# Patient Record
Sex: Female | Born: 1935 | Race: White | Hispanic: No | Marital: Married | Smoking: Former smoker
Health system: Southern US, Community
[De-identification: ages and names within clinical notes are randomized; demographics above are authoritative.]

## PROBLEM LIST (undated history)

## (undated) DIAGNOSIS — M1990 Unspecified osteoarthritis, unspecified site: Secondary | ICD-10-CM

## (undated) DIAGNOSIS — R350 Frequency of micturition: Secondary | ICD-10-CM

## (undated) DIAGNOSIS — I639 Cerebral infarction, unspecified: Secondary | ICD-10-CM

## (undated) DIAGNOSIS — F32A Depression, unspecified: Secondary | ICD-10-CM

## (undated) DIAGNOSIS — E785 Hyperlipidemia, unspecified: Secondary | ICD-10-CM

## (undated) DIAGNOSIS — E119 Type 2 diabetes mellitus without complications: Secondary | ICD-10-CM

## (undated) DIAGNOSIS — I219 Acute myocardial infarction, unspecified: Secondary | ICD-10-CM

## (undated) DIAGNOSIS — I255 Ischemic cardiomyopathy: Secondary | ICD-10-CM

## (undated) DIAGNOSIS — F329 Major depressive disorder, single episode, unspecified: Secondary | ICD-10-CM

## (undated) DIAGNOSIS — D649 Anemia, unspecified: Secondary | ICD-10-CM

## (undated) DIAGNOSIS — I2510 Atherosclerotic heart disease of native coronary artery without angina pectoris: Secondary | ICD-10-CM

## (undated) DIAGNOSIS — I10 Essential (primary) hypertension: Secondary | ICD-10-CM

## (undated) DIAGNOSIS — K219 Gastro-esophageal reflux disease without esophagitis: Secondary | ICD-10-CM

## (undated) HISTORY — DX: Hyperlipidemia, unspecified: E78.5

## (undated) HISTORY — DX: Ischemic cardiomyopathy: I25.5

## (undated) HISTORY — DX: Essential (primary) hypertension: I10

## (undated) HISTORY — PX: CORONARY ARTERY BYPASS GRAFT: SHX141

## (undated) HISTORY — DX: Atherosclerotic heart disease of native coronary artery without angina pectoris: I25.10

## (undated) HISTORY — PX: TONSILLECTOMY: SUR1361

## (undated) HISTORY — PX: SHOULDER ARTHROSCOPY W/ ROTATOR CUFF REPAIR: SHX2400

## (undated) HISTORY — DX: Anemia, unspecified: D64.9

## (undated) HISTORY — PX: APPENDECTOMY: SHX54

---

## 2001-05-08 ENCOUNTER — Encounter: Payer: Self-pay | Admitting: Orthopedic Surgery

## 2001-05-08 ENCOUNTER — Encounter: Admission: RE | Admit: 2001-05-08 | Discharge: 2001-05-08 | Payer: Self-pay | Admitting: Orthopedic Surgery

## 2001-05-28 ENCOUNTER — Encounter: Payer: Self-pay | Admitting: Orthopedic Surgery

## 2001-05-28 ENCOUNTER — Encounter: Admission: RE | Admit: 2001-05-28 | Discharge: 2001-05-28 | Payer: Self-pay | Admitting: Orthopedic Surgery

## 2001-05-30 ENCOUNTER — Ambulatory Visit (HOSPITAL_BASED_OUTPATIENT_CLINIC_OR_DEPARTMENT_OTHER): Admission: RE | Admit: 2001-05-30 | Discharge: 2001-05-31 | Payer: Self-pay | Admitting: Orthopedic Surgery

## 2007-04-24 ENCOUNTER — Ambulatory Visit: Payer: Self-pay | Admitting: Cardiology

## 2007-04-24 ENCOUNTER — Ambulatory Visit: Payer: Self-pay | Admitting: Cardiovascular Disease

## 2007-04-24 ENCOUNTER — Inpatient Hospital Stay (HOSPITAL_COMMUNITY): Admission: EM | Admit: 2007-04-24 | Discharge: 2007-05-08 | Payer: Self-pay | Admitting: Emergency Medicine

## 2007-04-25 ENCOUNTER — Encounter: Payer: Self-pay | Admitting: Internal Medicine

## 2007-04-26 ENCOUNTER — Ambulatory Visit: Payer: Self-pay | Admitting: Surgery

## 2007-05-22 ENCOUNTER — Ambulatory Visit: Payer: Self-pay | Admitting: Cardiovascular Disease

## 2007-05-27 ENCOUNTER — Ambulatory Visit: Payer: Self-pay | Admitting: Surgery

## 2007-05-27 ENCOUNTER — Encounter: Admission: RE | Admit: 2007-05-27 | Discharge: 2007-05-27 | Payer: Self-pay | Admitting: Surgery

## 2007-05-27 ENCOUNTER — Encounter: Payer: Self-pay | Admitting: Cardiology

## 2007-05-27 LAB — CONVERTED CEMR LAB
Amylase: 56 units/L (ref 0–105)
BUN: 17 mg/dL (ref 6–23)
CO2: 20 meq/L (ref 19–32)
Calcium: 9.1 mg/dL (ref 8.4–10.5)
Chloride: 103 meq/L (ref 96–112)
Creatinine, Ser: 0.97 mg/dL (ref 0.40–1.20)
Free T4: 1.23 ng/dL (ref 0.89–1.80)
Glucose, Bld: 122 mg/dL — ABNORMAL HIGH (ref 70–99)
HCT: 41.8 % (ref 36.0–46.0)
Hemoglobin: 13.4 g/dL (ref 12.0–15.0)
Lipase: 79 units/L — ABNORMAL HIGH (ref 0–75)
MCHC: 32.1 g/dL (ref 30.0–36.0)
MCV: 89.9 fL (ref 78.0–100.0)
Platelets: 398 10*3/uL (ref 150–400)
Potassium: 4.3 meq/L (ref 3.5–5.3)
RBC: 4.65 M/uL (ref 3.87–5.11)
RDW: 14.6 % — ABNORMAL HIGH (ref 11.5–14.0)
Sodium: 138 meq/L (ref 135–145)
TSH: 4.045 microintl units/mL (ref 0.350–5.50)
WBC: 7.9 10*3/uL (ref 4.0–10.5)

## 2007-08-05 ENCOUNTER — Ambulatory Visit: Payer: Self-pay | Admitting: Cardiology

## 2007-11-06 ENCOUNTER — Ambulatory Visit: Payer: Self-pay | Admitting: Cardiology

## 2007-11-06 LAB — CONVERTED CEMR LAB
ALT: 24 units/L (ref 0–35)
AST: 29 units/L (ref 0–37)
Albumin: 3.7 g/dL (ref 3.5–5.2)
Alkaline Phosphatase: 66 units/L (ref 39–117)
Bilirubin, Direct: 0.1 mg/dL (ref 0.0–0.3)
Cholesterol: 179 mg/dL (ref 0–200)
HDL: 49.9 mg/dL (ref >39.0)
LDL Cholesterol: 104 mg/dL — ABNORMAL HIGH (ref 0–99)
Total Bilirubin: 0.7 mg/dL (ref 0.3–1.2)
Total CHOL/HDL Ratio: 3.6
Total Protein: 6.9 g/dL (ref 6.0–8.3)
Triglycerides: 128 mg/dL (ref 0–149)
VLDL: 26 mg/dL (ref 0–40)

## 2008-01-26 ENCOUNTER — Ambulatory Visit: Payer: Self-pay | Admitting: Cardiology

## 2008-11-25 ENCOUNTER — Ambulatory Visit: Payer: Self-pay | Admitting: Cardiology

## 2008-11-25 ENCOUNTER — Encounter: Payer: Self-pay | Admitting: Cardiology

## 2008-11-25 DIAGNOSIS — I10 Essential (primary) hypertension: Secondary | ICD-10-CM | POA: Insufficient documentation

## 2008-11-25 DIAGNOSIS — R0602 Shortness of breath: Secondary | ICD-10-CM | POA: Insufficient documentation

## 2008-11-25 DIAGNOSIS — I2510 Atherosclerotic heart disease of native coronary artery without angina pectoris: Secondary | ICD-10-CM | POA: Insufficient documentation

## 2008-11-25 DIAGNOSIS — E785 Hyperlipidemia, unspecified: Secondary | ICD-10-CM | POA: Insufficient documentation

## 2008-11-25 LAB — CONVERTED CEMR LAB
BUN: 25 mg/dL — ABNORMAL HIGH (ref 6–23)
CO2: 29 meq/L (ref 19–32)
Calcium: 8.9 mg/dL (ref 8.4–10.5)
Chloride: 103 meq/L (ref 96–112)
Creatinine, Ser: 1.2 mg/dL (ref 0.4–1.2)
GFR calc non Af Amer: 46.02 mL/min (ref >60)
Glucose, Bld: 95 mg/dL (ref 70–99)
Potassium: 3.8 meq/L (ref 3.5–5.1)
Pro B Natriuretic peptide (BNP): 212 pg/mL — ABNORMAL HIGH (ref 0.0–100.0)
Sodium: 139 meq/L (ref 135–145)

## 2008-12-29 ENCOUNTER — Encounter: Payer: Self-pay | Admitting: Cardiology

## 2009-01-14 ENCOUNTER — Encounter: Payer: Self-pay | Admitting: Cardiology

## 2009-02-17 ENCOUNTER — Ambulatory Visit: Payer: Self-pay | Admitting: Cardiology

## 2009-05-13 ENCOUNTER — Encounter (INDEPENDENT_AMBULATORY_CARE_PROVIDER_SITE_OTHER): Payer: Self-pay | Admitting: *Deleted

## 2009-08-02 ENCOUNTER — Ambulatory Visit: Payer: Self-pay | Admitting: Cardiology

## 2010-08-04 ENCOUNTER — Encounter: Payer: Self-pay | Admitting: Cardiology

## 2010-08-04 ENCOUNTER — Ambulatory Visit: Payer: Self-pay | Admitting: Cardiology

## 2010-09-28 NOTE — Assessment & Plan Note (Signed)
Summary: 414.01 401.1  pfh,rn  Medications Added CRESTOR 20 MG TABS (ROSUVASTATIN CALCIUM) 1 by mouth daily FISH OIL   OIL (FISH OIL) 2 by mouth daily ATIVAN 0.5 MG TABS (LORAZEPAM) as needed METOPROLOL TARTRATE 25 MG TABS (METOPROLOL TARTRATE) one tablet twice a day      Allergies Added:   Visit Type:  Follow-up Primary Provider:  Blane Ohara, MD  CC:  CAD.  History of Present Illness: The patient presents for followup of her known coronary disease. Since I last saw her she continues to do exercising though not as frequently as I would like. She denies any chest discomfort, neck or arm discomfort. She has had palpitations, presyncope or syncope. She has had no PND or orthopnea. She has been unable to lose weight and says she just eats too much. She also reports that her LDL is not at target.  Current Medications (verified): 1)  Sertraline Hcl 50 Mg Tabs (Sertraline Hcl) .... One By Mouth Daily 2)  Metoprolol Succinate 50 Mg Xr24h-Tab (Metoprolol Succinate) .... One By Mouth Daily 3)  Crestor 20 Mg Tabs (Rosuvastatin Calcium) .Marland Kitchen.. 1 By Mouth Daily 4)  Aspirin 81 Mg Tbec (Aspirin) .... Take One Tablet By Mouth Daily 5)  Furosemide 20 Mg Tabs (Furosemide) .... Take One Tablet By Mouth Daily. 6)  Nitroglycerin 0.4 Mg Subl (Nitroglycerin) .... One Tablet Under Tongue Every 5 Minutes As Needed For Chest Pain---May Repeat Times Three 7)  Omeprazole 20 Mg Cpdr (Omeprazole) .... Take One Tablet By Mouth Once Daily. 8)  Lisinopril-Hydrochlorothiazide 10-12.5 Mg Tabs (Lisinopril-Hydrochlorothiazide) .Marland Kitchen.. 1 By Mouth Daily 9)  Fish Oil   Oil (Fish Oil) .... 2 By Mouth Daily 10)  Ativan 0.5 Mg Tabs (Lorazepam) .... As Needed  Allergies (verified): 1)  ! Penicillin 2)  ! Lipitor (Atorvastatin)  Past History:  Past Medical History:  1. Coronary artery disease (LAD, totally occluded at the origin, 80%       narrowing in the midsegment after diagonal, 50% narrowing in the       midvessel, 70%  narrowing in the proximal mid to mid circumflex,       right coronary artery had 30% mid stenosis followed by 80% stenosis       in the PDA.  EF 40%.  She underwent PTCA of a totally occluded       diagonal branch to the LAD.  She subsequently had CABG to the LIMA       to the LAD, SVG to diagonal, SVG to obtuse marginal, SVG to PDA).      (EF 60% with followup echocardiography 2010)  2. Hypertension.   3. Dyslipidemia.   4. Fe Defficiency anemia  Past Surgical History: Reviewed history from 08/02/2009 and no changes required. CABG Cataract Surgery  Review of Systems       As stated in the HPI and negative for all other systems.   Vital Signs:  Patient profile:   77 year old female Height:      64 inches Weight:      157 pounds BMI:     27.05 Pulse rate:   56 / minute Resp:     16 per minute BP sitting:   122 / 64  (right arm)  Vitals Entered By: Marrion Coy, CNA (August 04, 2010 9:22 AM)  Physical Exam  General:  Well developed, well nourished, in no acute distress. Head:  normocephalic and atraumatic Eyes:  PERRLA/EOM intact; conjunctiva and lids normal. Neck:  Neck  supple, no JVD. No masses, thyromegaly or abnormal cervical nodes. Chest Wall:  Well healed sternotomy scar Lungs:  Clear bilaterally to auscultation and percussion. Abdomen:  Bowel sounds positive; abdomen soft and non-tender without masses, organomegaly, or hernias noted. No hepatosplenomegaly. Msk:  Back normal, normal gait. Muscle strength and tone normal. Extremities:  No clubbing or cyanosis. Neurologic:  Alert and oriented x 3. Skin:  Intact without lesions or rashes. Cervical Nodes:  no significant adenopathy Axillary Nodes:  no significant adenopathy Inguinal Nodes:  no significant adenopathy Psych:  Normal affect.   Detailed Cardiovascular Exam  Neck    Carotids: Carotids full and equal bilaterally without bruits.      Neck Veins: Normal, no JVD.    Heart    Inspection: no  deformities or lifts noted.      Palpation: normal PMI with no thrills palpable.      Auscultation: regular rate and rhythm, S1, S2 without murmurs, rubs, gallops, or clicks.    Vascular    Abdominal Aorta: no palpable masses, pulsations, or audible bruits.      Femoral Pulses: normal femoral pulses bilaterally.      Pedal Pulses: normal pedal pulses bilaterally.      Radial Pulses: normal radial pulses bilaterally.      Peripheral Circulation: no clubbing, cyanosis, or edema noted with normal capillary refill.     EKG  Procedure date:  08/04/2010  Findings:      Sinus bradycardia, rate 60, premature atrial contractions, no acute ST-T wave changes  Impression & Recommendations:  Problem # 1:  CAD (ICD-414.00) The patient has no symptoms. No cardiovascular testing is indicated. She will continue with risk reduction. Orders: EKG w/ Interpretation (93000)  Problem # 2:  HYPERLIPIDEMIA (ICD-272.4) This is followed by her primary provider. The Crestor is cost prohibitive for her. I don't know whether Vytorin would be less effective or whether her LDL would be at target with this.  I will defer to her primary MD to consider this drug.  Problem # 3:  HYPERTENSION (ICD-401.9) Her blood pressure is controlled. Will cost consideration I will switch her to Toprol immediate release 25 mg b.i.d.  Patient Instructions: 1)  Your physician recommends that you schedule a follow-up appointment in: 12 months with Dr Antoine Poche 2)  Your physician has recommended you make the following change in your medication: Stop Metoprolol succinate and start metoprolol tartrate 25 mg twice a day Prescriptions: METOPROLOL TARTRATE 25 MG TABS (METOPROLOL TARTRATE) one tablet twice a day  #60 x 11   Entered by:   Charolotte Capuchin, RN   Authorized by:   Rollene Rotunda, MD, [facility]   Signed by:   Charolotte Capuchin, RN on 08/04/2010   Method used:   Electronically to        [facility]  (retail)       [address]       [place], [place]  [id number]       Ph: [phone]       Fax: [phone]   RxID:   [id number]  I have reviewed and approved all prescriptions at the time of this visit. Rollene Rotunda, MD, [facility]  August 04, 2010 10:08 AM

## 2011-01-09 NOTE — Assessment & Plan Note (Signed)
[facility]                            CARDIOLOGY OFFICE NOTE   NAME:[name], [name]                       MRN:          [id number]  DATE:01/26/2008                            DOB:          1927-05-12    PRIMARY CARE PHYSICIAN:  Dr. Wyonia Hough.   REASON FOR PRESENTATION:  Evaluate the patient with coronary disease  status post CABG.   HISTORY OF PRESENT ILLNESS:  The patient is a lovely 77 year old with  coronary disease as described below.  She has done well since I last saw  her.  She has had no chest discomfort, neck or arm discomfort.  She had  no palpitation, presyncope or syncope.  She had no PND or orthopnea.  She takes a blood pressure in the morning, and she says it always runs  in the 130s-140s systolic.  She is not keeping a blood pressure diary  and does not take it other times of the day.  She is not exercising as I  would like.  She says she just has not been motivated.  She did not want  to participate in cardiac rehab because of cost.   PAST MEDICAL HISTORY:  1. Coronary artery disease (LAD, totally occluded at the origin, 80%      narrowing in the midsegment after diagonal, 50% narrowing in the      midvessel, 70% narrowing in the proximal mid to mid circumflex,      right coronary artery had 30% mid stenosis followed by 80% stenosis      in the PDA.  EF 40%.  She underwent PTCA of a totally occluded      diagonal branch to the LAD.  She subsequently had CABG to the LIMA      to the LAD, SVG to diagonal, SVG to obtuse marginal, SVG to PDA).  2. Hypertension.  3. Dyslipidemia.   ALLERGIES/INTOLERANCES:  PENICILLIN, LIPITOR.   MEDICATIONS:  1. Simvastatin 40 mg q.h.s.  2. Metoprolol 50 mg daily.  3. Zoloft 50 mg daily.  4. Aspirin 81 mg daily.  5. Lisinopril HCT 10/12.5 daily.   REVIEW OF SYSTEMS:  As stated in the HPI otherwise negative for other  systems.   PHYSICAL EXAMINATION:  GENERAL:  The patient is in no  distress.  VITAL SIGNS:  Blood pressure 151/61, heart 64 and regular, weight 150  pounds.  NECK:  No jugular distention at 45 degrees, carotid upstroke brisk and  symmetrical.  No bruits, thyromegaly.  LYMPHATICS:  No adenopathy.  LUNGS:  Clear to auscultation bilaterally.  BACK:  No costovertebral angle tenderness.  CHEST:  Well-healed sternotomy scar.  HEART:  PMI not displaced or sustained, S1-S2 within normal.  No S3-S4,  no clicks, rubs, murmurs.  ABDOMEN:  Flat, positive bowel sounds normal frequency pitch.  No  bruits, rebound, guarding or midline pulsatile mass.  No organomegaly.  SKIN:  No rash.  EXTREMITIES:  Pulses 2+, no edema.   ASSESSMENT/PLAN:  1. Coronary disease.  The patient is having no further symptoms.  No      further cardiovascular testing is  suggested.  We are going to check      with cardiac rehab to see what cost she would have today.  I would      still like her to this if she is not exercising on her own.  She is      also going to look into the [facility] in [place] as a possibility.  2. Hypertension.  She is having a little bit of a cough.  We discussed      coming off Lisinopril.  She does not want to take a non generic      drug.  Because of her slightly reduced ejection fraction.  I would      like to continue with an ACE or an ARB.  She says the cough is not      very problematic, and so she will continue this.  She is going to      keep a blood pressure diary, and we discussed how to do this with      blood pressures at different times of the day.  She can share that      with Dr. Lodema Hong or myself.  The goal would systolic blood pressure      always less than 140.  3. Cardiomyopathy.  The patient has a mildly reduced ejection      fraction.  We are managing this as above.  4. Dyslipidemia.  She had a good lipid profile when last checked in      the spring.  I have suggested that she have this done in the fall      per Dr. Lodema Hong, the goal being LDL  less than 100, HDL greater than      50.  5. Follow-up.  Will see her back in 12 months or sooner if needed.     Rollene Rotunda, MD, [facility]  Electronically Signed    JH/MedQ  DD: 01/26/2008  DT: 01/26/2008  Job #: [id number]   cc:   Dr. Wyonia Hough

## 2011-01-09 NOTE — Cardiovascular Report (Signed)
[name], [name]                ACCOUNT NO.:  [id number]   MEDICAL RECORD NO.:  [id number]          PATIENT TYPE:  INP   LOCATION:  2908                         FACILITY:  [facility]   PHYSICIAN:  Bruce R. Juanda Chance, MD, FACCDATE OF BIRTH:  [birth year]   DATE OF PROCEDURE:  04/24/2007  DATE OF DISCHARGE:                            CARDIAC CATHETERIZATION   PROCEDURES PERFORMED:  1. Cardiac catheterization.  2. Percutaneous coronary intervention.   CARDIOLOGISTEverardo Beals Juanda [name], M.D., [facility]   HISTORY:  This patient is 77 years old  and no prior history of known  heart disease.  [facility] and  was transferred to the [facility] emergency room where she had diffuse ST-T  changes on her EKG and positive troponins.  She was seen by Dr.  Antoine Poche.  Because of persistent pain it was elected to take her  emergently to the cath lab, even though she did not have diagnostic  criteria for an ST elevation MI.   DESCRIPTION OF THE PROCEDURE:  On arrival in the cath lab the patient  became hypotensive and bradycardic, and was treated with dopamine and  atropine.  The procedure was performed via the right femoral artery  using an arterial sheath and a 6 [place] preformed coronary catheters.  A  front wall arterial puncture was performed and Omnipaque contrast was  used.   After completion of the diagnostic study we made the decision to proceed  with intervention on the totally occluded diagonal branch of the LAD.   The patient was given an Angiomax bolus and infusion and was given 600  mg of Plavix, and had been previously given four chewable aspirin.  We  used a Q-3.5 six [place] guiding catheter with side holes.  We passed a  ProWater down the LAD and we were able to cross the ProWater across the  totally occluded diagonal branch.  The diagonal branch stenosis in the  LAD had a bifurcation lesion with about 80% narrowing in the LAD.  Our  strategy was to treat  just the diagonal branch with balloon angioplasty.   We first went in with a 2.0 x 15 mm Maverick and performed three  inflations to eight atmospheres for 30 seconds each.  This gave [place]  suboptimal results, so we went in with a 2.25 x 50 mm cutting balloon  and performed three inflations up to four atmospheres for 45 seconds  each.  We still had a less than optimal result, so we went in with a  2.25 x 50 mm Maverick and performed two inflations up to two  minutes  each at seven atmospheres.  This gave good flow and reasonable, but not  optimal, result.  There was TIMI III flow in the vessel.  At this point  we felt it was unadvisable to treat the LAD diagonal branch bifurcation  lesion with two stents in the setting of an acute myocardial infarction.  We decided to settle for the PTCA results.   The patient had nausea during the procedure, but otherwise tolerated the  procedure  well and left the laboratory in satisfactory condition.   RESULTS:   HEMODYNAMIC DATA:  The aortic pressure was 100/63 with a mean of 76 and  the left ventricle pressure was a 100/13.   ANGIOGRAPHIC DATA:  Left Main Coronary Artery:  The left main coronary  artery significant disease.   Left Anterior Descending:  The left anterior descending artery gave rise  to a diagonal branch which was totally occluded at its origin and then a  septal perforator.  There was 80% narrowing in the LAD just after the  diagonal branch.  There was 50% narrowing in the mid LAD and there was  diffuse disease throughout the LAD that was less obstructed.   Circumflex Artery:  The circumflex artery gave rise to a small marginal  branch, a second marginal branch and two posterolateral branches.  There  was 70% narrowing in the proximal to mid circumflex artery.   Right Coronary Artery:  The right coronary artery was a moderate-sized  vessel that gave rise to a posterior descending branch and three  posterolateral branches.  There  was 30% proximal and 30% mid stenoses in  the right coronary artery.  There was 80% narrowing in the proximal and  80% narrowing in the mid-to-distal portion of the posterior descending  branch.   Left Ventriculogram:  The left ventriculogram performed in the RAO  projection showed akinesis of the anterolateral wall all the way out  near the tip of the apex.  The estimated ejection fraction was 40%.   Following PTCA of the lesion in diagonal branch the stenosis improved  from a hundred percent to 50% and the flow improved from TIMI 0 to TIMI  III flow.   CONCLUSION:  1. Non-ST-elevation myocardial infarction with total occlusion of a      very large diagonal branch of the left anterior descending , 80%      narrowing in the proximal left anterior descending and 50%      narrowing in the mid left anterior descending.  2. There was 70% narrowing in the proximal mid circumflex artery, 30%      proximal and 30% mid stenosis in the right coronary artery with 80%      stenoses in the posterior descending branch of the right coronary,      and anterolateral wall akinesis with an estimated ejection fraction      of 40%.  3. Successful percutaneous transluminal coronary angioplasty of the      totally occluded diagonal branch of the left anterior descending      with improvement in percent narrowing from a hundred percent to      50%, and improvement of flow from TIMI 0 to TIMI III flow.   DISPOSITION:  The patient to the post angiographic care unit for further  observation.  The right femoral artery was closed Angio-Seal.   The diagonal was a very large vessel that bifurcates into two sub-  branches.  This supplied more territory than the LAD itself and is a  very important vessel.  I think the options in the future would be  treatment of the LAD diagonal  bifurcation lesion with two stents on an elective basis versus surgical  revascularization.  The patient is 77 years old, but  otherwise appears  in good health and my leaning would be to consider surgery.  I will  review this with my colleagues.      Bruce Elvera Lennox Juanda Chance, MD, [facility]  Electronically Signed  BRB/MEDQ  D:  04/24/2007  T:  04/26/2007  Job:  [id number]   cc:   Rollene Rotunda, MD, [facility]  [facility]  Cardiopulmonary Laboratory

## 2011-01-09 NOTE — Consult Note (Signed)
[name], [name]                ACCOUNT NO.:  [id number]   MEDICAL RECORD NO.:  [id number]          PATIENT TYPE:  INP   LOCATION:  2002                         FACILITY:  [facility]   PHYSICIAN:  Evelene Croon, M.D.     DATE OF BIRTH:  Sep 08, 1926   DATE OF CONSULTATION:  04/26/2007  DATE OF DISCHARGE:                                 CONSULTATION   PROCEDURE:  Her correction date of dictation in consultation April 26, 2007.   REFERRING PHYSICIAN:  Dr. Rollene Rotunda and Dr. Charlies Constable.   REASON FOR CONSULTATION:  Severe three-vessel coronary disease status  post non-ST segment elevation MI.   CLINICAL HISTORY:  I was asked by Dr. Antoine Poche to evaluate this 77-year-  old woman for consideration of coronary artery bypass graft surgery.  She has a history of hypertension and hyperlipidemia that had been  untreated and presented on April 24, 2007 with non-ST segment elevation  MI.  She reports having some decrease in her exercise tolerance and easy  fatigability over the past 2 to 3 weeks.  On the morning of April 24, 2007, she awoke about 2:00 to 3:00 in the morning with 05/10 pain in her  neck, jaw, and shoulder as well as some arm discomfort.  This was  associated with shortness of breath, nausea, and diaphoresis.  These  symptoms improved after a few hours.  Later that day, about 12:30 p.m.,  she had recurrent 5/10 pain and presented to Urgent Care, where  electrocardiogram was abnormal.  She is brought to [facility] and  treated with intravenous heparin and nitroglycerin, but continued to  have 02/10 chest pain.  Her initial CPK was 1475 with an MB of 179.6 and  a troponin-I of 42.01.  She was taken to catheterization lab at night,  due to mild ongoing chest pain, and this showed severe three-vessel  disease.  The culprit appeared to be an occluded, enlarged first  diagonal.  This was opened with PTCA, decreasing the stenosis about 50%.  This was a large vessel that divided  into two sub-branches, and one of  them had 80% stenosis.  The LAD also had an 80% long stenosis, just  after the takeoff of the diagonal branch.  There is about 50% mid-LAD  stenosis.  Left circumflex had 70% proximal stenosis.  The right  coronary artery had 30% proximal stenosis and sequential 80% posterior  descending stenosis.  This was a large dominant vessel.  Left  ventricular ejection fraction of about 40% with anterolateral  hypokinesis.  The patient has remained free of chest pain, since that  procedure.   REVIEW OF SYSTEMS:  As follows:  GENERAL:  She denies any fever or chills.  She has had 2 to 3 weeks of  easy fatigability and some sweating.  She denies any recent weight  changes.  EYES:  Negative.  ENT:  Negative.  ENDOCRINE:  She denies  diabetes and hypothyroidism.  CARDIOVASCULAR:  As above.  She denies PND  or orthopnea.  She has had exertional dyspnea.  She denies peripheral  edema  and palpitations.  RESPIRATORY:  She denies cough and sputum  production.  GI:  She has had some nausea associated with her a angina,  but denies any vomiting.  She has had no mild red blood per rectum.  GU:  She denies dysuria and hematuria.  MUSCULOSKELETAL:  She denies  arthralgias, myalgias.  NEUROLOGICAL:  She denies any focal weakness or  numbness.  She denies dizziness and syncope.  VASCULAR:  She denies  claudication and phlebitis.   ALLERGIES:  TO PENICILLIN.  SHE HAS MYALGIAS FROM LIPITOR.   PSYCHIATRIC:  Negative.   SOCIAL HISTORY:  She lives in [place] with her husband, has been  married for 55 years.  She is tired.  She has never smoked.  She denies  alcohol abuse.   FAMILY HISTORY:  Her mother died at 43 of a stroke, and her father died  at 8 of myocardial infarction.  She had two brothers who have coronary  disease.   PHYSICAL EXAMINATION:  VITAL SIGNS:  Her blood pressure is 100/50.  Pulse 65 and regular.  Respiratory rate is 16 and unlabored.  GENERAL:  She is a  well-developed white female in no distress.  HEENT:  Exam shows to be normocephalic and atraumatic.  Pupils are equal  and reactive to light and accommodation.  Extraocular muscles are  intact.  Throat is clear.  NECK:  Exam shows normal carotid pulses bilaterally.  No bruits.  There  is no adenopathy or thyromegaly.  CARDIAC:  Exam shows regular rate and rhythm.  Normal S1-S2.  There is  no murmur, rub or gallop.  LUNGS:  Clear.  ABDOMINAL:  Exam shows active bowel sounds.  Abdomen soft and nontender.  No palpable masses or organomegaly.  EXTREMITY:  Exam shows no peripheral edema.  Pedal pulses are palpable  bilaterally.  SKIN:  Warm and dry.  NEUROLOGIC:  Exam shows to be alert and oriented x3.  Motor and sensory  exams grossly normal.   PAST MEDICAL HISTORY:  Is significant for hypertension and  hyperlipidemia that have not been treated.  She is status post right  rotator cuff tear, repaired in October of 2002.  She is status post  resection of a thyroid nodule.   LABORATORY:  Examination shows normal electrolytes, BUN of 13 and  creatinine 0.79.  Glucose was 139 on admission.  Her white blood cell  count was 15.1 with a hemoglobin of 14.9, hematocrit 44.3, platelet  count 355,000.  Albumin was 3.5.  SGOT was elevated at 118, SGPT  elevated at 46, alkaline phosphatase normal, bilirubin normal at 1.0.  Her TSH was 4.485, which was within normal limits.  Total cholesterol  was 297, triglycerides of 189 and HDL of 47 and LDL of 212.  Chest x-ray  showed mild pulmonary vascular congestion, without acute abnormality.  Electrocardiogram on admission showed normal sinus rhythm with septal  infarct and lateral ST and T-wave abnormality.  Electrocardiogram  yesterday showed sinus bradycardia with anterior infarct and lateral ST  and T-wave changes.   IMPRESSION:  Ms. Mauch has severe three-vessel coronary disease,  presenting with a non-ST segment elevation MI.  I agree that coronary   artery bypass graft surgery in the best long-term treatment for her.  I  think percutaneous intervention on her LAD would be risky, given the  length of the lesion in proximity to the diagonal branch.  In addition,  she has significant three-vessel coronary disease.  Ideally, I would  like to let  her recover for several days from her myocardial infarction.  I am for proceeding with surgery.  I discussed the operation with her  and her family.  We discussed alternatives, benefits, and risks  including but not limited to bleeding, blood transfusion, obstruction,  stroke, myocardial infarction, graft failure, and death.  They  understand and would like to proceed with surgery.  I will check our  schedule and see when it can it be scheduled some time next week.      Evelene Croon, M.D.  Electronically Signed     BB/MEDQ  D:  04/26/2007  T:  04/27/2007  Job:  [id number]

## 2011-01-09 NOTE — Op Note (Signed)
[name], [name]                ACCOUNT NO.:  [id number]   MEDICAL RECORD NO.:  [id number]          PATIENT TYPE:  INP   LOCATION:  2309                         FACILITY:  [facility]   PHYSICIAN:  Guadalupe Maple, M.D.  DATE OF BIRTH:  1927/03/08   DATE OF PROCEDURE:  05/02/2007  DATE OF DISCHARGE:                               OPERATIVE REPORT   PROCEDURE PERFORMED:  Intraoperative transesophageal echocardiography.   HISTORY OF PRESENT ILLNESS:  The patient is a 77 year old white female  who suffered a non-ST-segment elevation myocardial infarction who now  has three vessel coronary disease.  She is scheduled to undergo coronary  artery bypass grafting by Dr. Laneta Simmers.  Intraoperative transesophageal  echocardiography was requested to evaluate the left ventricular function  and to assess and determine if there is any valvular pathology and to  serve as a monitor for intraoperative volume status.   DESCRIPTION OF PROCEDURE:  The patient was brought to the operating room  at [facility] and general anesthesia was induced without  difficulty.  The trachea was intubated without difficulty.  The  transesophageal echocardiography probe was then inserted into the  esophagus without difficulty.   IMPRESSION:  Prebypass findings:  1. Aortic valve:  The aortic valve was trileaflet.  The leaflets were      slightly thickened but opened normally and there was no aortic      insufficiency.  There were no significant calcifications of the      aortic leaflets.  2. Mitral valve:  The mitral leaflets appeared thin and pliable and      they opened normally.  There was no evidence of mitral valve      prolapse or fluttering of the leaflets.  There was trace mitral      insufficiency.  3. Left ventricle:  There was hypokinesis in the distal anterior      septal region of the left ventricle and involving the apex.  There      was good contractility in the basal and mid segments.  The ejection      fraction was estimated at 45%.  There was no thrombus noted in the      left ventricular apex.  The left ventricular wall thickness      measured 0.95 cm.  4.  Right ventricle:  The right ventricular size      was normal.  There was good contractility of the right ventricular      free wall.  4. Tricuspid valve:  The tricuspid valve appeared structurally intact.      There was 1+ tricuspid insufficiency.  5. Interatrial septum:  The interatrial septum was intact without      evidence of patent foramen ovale which was confirmed by color      Doppler and bubble study.  6. Left atrium:  There was no thrombus noted in the left atrial cavity      or left atrial appendage.  7. Ascending aorta:  The ascending aorta appeared normal caliber with      a well defined sinotubular ridge and aortic root.  There was mild      to moderate atheromatous disease involving the proximal aorta.  8. Descending aorta:  The descending aorta showed mild to moderate      atheromatous disease which is normal caliber and measured 2.02 cm      in diameter.   Post bypass findings:  1. Aortic valve:  The aortic valve appeared unchanged from the      prebypass study.  There was no evidence of aortic stenosis.  2. Mitral valve:  The mitral valve again showed trace mitral      insufficiency and appeared normal and functioned well.  3. Left ventricle:  The left ventricular cavity again showed good      contractility except in the area of the distal anterior wall and      anterior septal area.  The ejection fraction was again estimated at      45%.  4. Right ventricle:  The right ventricular size initially in the post      bypass, the right ventricle was dilated and with      decreased contractility but over the subsequent 15-20 minutes post      bypass, the right ventricular function returned to the prebypass      state with good contractility of the right ventricular free wall      and normal right ventricular  size.           ______________________________  Guadalupe Maple, M.D.     DCJ/MEDQ  D:  05/02/2007  T:  05/03/2007  Job:  [id number]   cc:   Guadalupe Maple, M.D.  Evelene Croon, M.D.

## 2011-01-09 NOTE — Assessment & Plan Note (Signed)
OFFICE VISIT   VALKYRIE, GUARDIOLA  DOB:  1927/06/03                                        June 03, 2007  CHART #:  [id number]   HISTORY OF PRESENT ILLNESS:  Ms. Ketcherside returns today for followup status  post coronary artery bypass graft surgery on May 02, 2007. She had  a slow postoperative course, probably related to her preoperative non-ST  segment elevation MI with postoperative congestive heart failure and  fluid retention. She gradually improved with diuresis. Since discharge,  she says she has been feeling much better. She is walking daily without  chest pain or shortness of breath.   PHYSICAL EXAMINATION:  VITAL SIGNS:  Her blood pressure is 127/64 and  her pulse is 92 and regular. Respiratory rate is 18 and unlabored.  Oxygen saturation on room air is 98%.  GENERAL:  She looks well.  CARDIOVASCULAR:  Cardiac examination shows a Regular rate and rhythm  with normal heart sounds.  LUNG:  Clear. The chest incision is healing well and the sternum is  stable.  EXTREMITIES:  Her leg incision is healing well and there is no  peripheral edema.   DIAGNOSTIC STUDIES:  Followup chest x-ray shows clear lung fields and no  pleural effusions.   MEDICATIONS:  Toprol XL 25 mg daily, aspirin 325 mg daily, Zocor 40 mg  daily, and Ultram p.r.n. for pain. She asked if she could decrease her  aspirin to 81 mg per day, so that it would not bother her stomach and I  told her that was fine with me.   ACTIVITY:  Overall, Ms. Szabo is recovering well following her surgery.  I encouraged her to continue walking as much as possible. I told her  that she could return to driving a car when she felt comfortable with  that. She is to refrain from lifting anything heavier than 10 pounds for  a total of 3 months from date of surgery.   FOLLOWUP:  She will continue to followup with Dr. Rollene Rotunda and  will contact me if she develops any problems with her incision.   Evelene Croon, M.D.  Electronically Signed   BB/MEDQ  D:  06/03/2007  T:  06/03/2007  Job:  [id number]

## 2011-01-09 NOTE — Assessment & Plan Note (Signed)
OFFICE VISIT   TEQUITA, MARRS  DOB:  1927-01-05                                        May 27, 2007  CHART #:  [id number]   NOTE:  Ms. Facey returned today for followup status post coronary artery  bypass graft surgery on May 02, 2007.  She presented on April 24, 2007, with an acute non-ST-segment-elevation MI with a peak CPK of 1475  and an MB of 180.  Her troponin was 42.  Catheterization showed severe  three-vessel disease with culprit appearing to be an occluded large  first diagonal branch that was opened with acute PTCA.  Her ejection  fraction was about 40% with anterior akinesis.  She was treated  medically for about five days prior to surgery.  Her postoperative  course was somewhat slow, with volume overload and some congestive heart  failure, but she gradually improved.  Since discharge she said she has  been feeling fairly well and is walking daily without chest pain or  shortness of breath.  She saw cardiology recently and no changes were  made to her medical regimen.  Her only complaint has been of some nausea  since going home.  She stopped taking her pain medicine on Sunday and  says that her nausea has improved.   PHYSICAL EXAMINATION:  VITAL SIGNS:  On physical examination today her  blood pressure is 127/64, and her pulse is 92 and regular.  Respiratory  rate is 18 and unlabored.  Oxygen saturation on room air is 98%.  GENERAL:  She looks well.  CARDIAC:  Exam shows a regular rate and rhythm with normal heart sounds.  LUNGS:  Clear.  CHEST:  The chest incision is healing well, and the sternum is stable.  EXTREMITIES:  Her leg incisions are healing well, and there is no lower  extremity edema.   Followup chest x-ray today shows no active cardiopulmonary disease.   Her medications are aspirin 325 mg daily, Zocor 40 mg daily, Protonix 40  mg daily, Lopressor 50 mg daily, and Ultram p.r.n.  As mentioned above,  she has  discontinued the Ultram.  The Protonix was started last week by  cardiology due to some nausea.   IMPRESSION:  Overall Ms. Toves is recovering well from her surgery.  She  feels that her nausea is related to her pain medication, which she has  stopped.  She is also concerned about whether the aspirin may be causing  her some GI symptoms.  I told her she could decrease her aspirin to 81  mg per day to see if that helps.  She will continue her other  medications.  I told her she could return to driving a car but should  refrain from lifting anything heavier than 10 pounds for a total of  three months from the date of surgery.  She has a followup appointment  with cardiology in December and will contact me if she develops any  problems with her incision.   Evelene Croon, M.D.  Electronically Signed   BB/MEDQ  D:  05/27/2007  T:  05/27/2007  Job:  [id number]   cc:   Rollene Rotunda, MD, [facility]

## 2011-01-09 NOTE — H&P (Signed)
[name], [name]                ACCOUNT NO.:  [id number]   MEDICAL RECORD NO.:  [id number]          PATIENT TYPE:  EMS   LOCATION:  MAJO                         FACILITY:  [facility]   PHYSICIAN:  Pricilla Riffle, MD, FACCDATE OF BIRTH:  Jan 04, 1927   DATE OF ADMISSION:  04/24/2007  DATE OF DISCHARGE:                              HISTORY & PHYSICAL   PRIMARY CARDIOLOGIST:  The patient is seen at [facility] cardiology, being  seen by Dr. Dietrich Pates.   PRIMARY CARE Ketih Goodie:  Dr. Lodema Hong.   PATIENT PROFILE:  A 77 year old Caucasian female without prior history  of CAD who presented to the [facility] ED with complaints of chest pain  and ECG changes as well as elevated cardiac markers suggestive of non-ST-  elevation MI.   PROBLEMS.:  1. Non-ST-elevation myocardial infarction  2. History of hypertension.  3. History of hyperlipidemia.      a.     Intolerant to Lipitor for causing myalgias.  4. Anxiety.   HISTORY OF PRESENT ILLNESS:  A 77 year old Caucasian female without  prior history of CAD.  She has noted some decreased exercise tolerance  and easy fatiguability over the past 2-3 weeks.  This morning around two  to three a.m. she awoke with 5/10 bilateral neck, jaw, shoulder and arm  discomfort associated with shortness of breath, nausea and diaphoresis.  She got out of bed and took a Tylenol and satisfactory until about 5:00  a.m. at which point she felt better.  She did okay this morning and then  about 12:30 p.m. she had recurrent 5/10 pain again with similar  associated symptoms and her husband took her to Urgent Care about 1:30.  An ECG was performed showing Qs in V1 through V3 with inferolateral ST  depression and minimal ST elevation in 1 and AVL.  EMS was called.  She  was taken to the [facility] ED.  Here in the ED she was treated with  heparin and IV nitroglycerin and her first set of cardiac markers  revealed an MB of 44.1, troponin-I of 6.17.  Currently she reports maybe  1-2/10 neck and chest discomfort.   ALLERGIES:  PENICILLIN and LIPITOR which caused myalgias.  She also took  a CHOLESTEROL POWDER MEDICINE which caused myalgias.   HOME MEDICATION:  Includes an anxiety pill and includes a blood pressure  pill.   FAMILY HISTORY:  Mother died of CVA at age 82.  Father died of an MI at  age 41.  She has two brothers both of which have CAD.  There is no  diabetes or stroke in her siblings.   SOCIAL HISTORY:  She lives in [place] with her husband of 55 years.  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  She denies any tobacco, alcohol or drug use.  She does not  routinely exercise.   REVIEW OF SYSTEMS:  Positive for easy fatiguability over the past couple  of weeks.  She has had chest pain, shortness of breath and diaphoresis,  headache and nausea over the past day.  Otherwise all systems reviewed  and negative.   PHYSICAL EXAM:  Heart rate 86, respirations 20, blood pressure 173/95,  pulse ox 98% on 2 liters.  Pleasant white female in no acute distress.  Awake, alert and oriented x3.  HEENT is normal.  NEURO:  Grossly intact and nonfocal.  NECK:  No bruits or JVD.  LUNGS:  Respirations were unlabored.  CARDIAC:  Regular S1, S2, no S3-4 or murmurs.  ABDOMEN: Round, soft, nontender, nondistended.  Bowel sounds present x4  EXTREMITIES:  Warm, dry, pink.  No clubbing, cyanosis or edema.  Posterior tibial pulses 2+ and equal bilaterally.  No femoral bruits.   Chest x-ray shows mild pulmonary vascular congestion without edema.  EKG  shows sinus rhythm with a rate of 84.  She is 1 mm ST depression in 2,  3, AVF, V4-V6 as well as anterior Q-waves.  She is less than 1 mm ST  elevation in 1 and AVL with T-wave inversion.   LAB WORK:  Hemoglobin 14.9, hematocrit 44.3, WBC 15.1 platelets 355.  Sodium 138, potassium 3.9, chloride 104, CO2 26, BUN 13, creatinine  0.79, glucose 139, CK-MB 44.1, troponin-I 6.17, PTT 33, INR  0.9.   ASSESSMENT AND PLAN:  1. Acute non-ST-elevation MI.  Plan to admit, cycle cardiac markers      which currently are elevated with a troponin 6.17, MB of 44.1.      Will plan on cardiac catheterization.  The patient continues to      have some discomfort and does not appear to be all that      comfortable.  We will plan a catheterization tonight. Plan aspirin,      statin, beta blocker, ACE inhibitors,  2B3 inhibitor as well as      heparin and nitro.  Will treat her with morphine right now as well.      2.  Hypertension. Add beta blocker and ACE inhibitor, titrate and      follow.  2. Hyperlipidemia, untreated.  Check lipids and LFTs.  She has tried      Lipitor in the past as well as what sounds like a bile acid      sequestrant, with myalgias.  We will try simvastatin.      Nicolasa Ducking, ANP      Pricilla Riffle, MD, [facility]  Electronically Signed    CB/MEDQ  D:  04/24/2007  T:  04/25/2007  Job:  [id number]

## 2011-01-09 NOTE — Assessment & Plan Note (Signed)
[facility]                            CARDIOLOGY OFFICE NOTE   NAME:[name], [name]                       MRN:          [id number]  DATE:11/06/2007                            DOB:          1927/06/04    PRIMARY CARE PHYSICIAN:  Dr. Wyonia Hough.   REASON FOR PRESENTATION:  Evaluate patient with coronary, disease status  post CABG.  I am treating her for hypertension as well.   HISTORY OF PRESENT ILLNESS:  The patient is a lovely 77 year old with  coronary disease as described below.  She is status post stent CABG in  August 2008.  From this standpoint, she has done relatively well.  She  has been doing some walking.  She gets a little soreness in her chest if  she does something like rake.  However, this is not similar to previous  complaints, at which point she had arm discomfort.  She is not having  any neck discomfort.  She is not having any shortness of breath, PND, or  orthopnea.  She has some days where she is fatigued, but other days  where she has more energy.  She did start on hydrochlorothiazide for  management of her blood pressure and did not have any issues with this.   PAST MEDICAL HISTORY:  Coronary artery disease (LAD, totally occluded at  the origin, 80% narrowing in the midsegment after diagonal, 50%  narrowing in the midvessel, 70% narrowing in the proximal midcircumflex,  the right coronary artery had 30% midstenosis, followed by 80% stenosis  in the PDA.  The EF was 40%.  The patient underwent PTCA of a totally  occluded diagonal branch of the LAD.  She subsequently had CABG, with a  LIMA to the LAD, saphenous vein graft to diagonal, saphenous vein graft  to obtuse marginal, saphenous vein graft to PDA), hypertension,  dyslipidemia.   ALLERGIES:  1. PENICILLIN.  2. LIPITOR caused myalgias.   MEDICATIONS:  1. Aspirin 81 mg daily.  2. Hydrochlorothiazide 12.5 mg daily.  3. Zoloft 50 mg daily.  4. Metoprolol 50 mg daily.  5. Simvastatin 40 mg at bedtime.   REVIEW OF SYSTEMS:  As stated in the HPI, and otherwise negative for  other systems.   PHYSICAL EXAMINATION:  GENERAL:  The patient is in no distress.  VITAL SIGNS:  Blood pressure 180/76, heart rate 57 and regular, weight  148 pounds, body mass index 25.  HEENT:  Eyes unremarkable.  Pupils equal, round, and reactive to light.  Fundi not visualized.  NECK:  No jugular venous distention at 45 degrees.  Carotid upstroke  brisk and symmetrical.  No bruits, no thyromegaly.  LYMPHATICS:  No cervical, axillary, or inguinal adenopathy.  LUNGS:  Clear to auscultation bilaterally.  BACK:  No costovertebral angle tenderness.  CHEST:  Well-healed sternotomy scar.  HEART:  PMI not displaced or sustained, S1 and S2 within normal.  No S3,  no S4, no clicks, no rubs, no murmurs.  ABDOMEN:  Obese.  Positive bowel sounds, normal in frequency and pitch.  No bruits, rebound, guarding or midline pulsatile mass,  no organomegaly.  SKIN:  No rashes, no nodules.  EXTREMITIES:  2+ pulses throughout.  No edema, no cyanosis, no clubbing.  NEUROLOGIC:  Grossly intact throughout.   EKG:  Sinus bradycardia, rate 57, axis within normal limits, intervals  within normal limits.  No acute ST wave changes.   ASSESSMENT AND PLAN:  1. Coronary disease.  She is having no ongoing symptoms.  No further      cardiovascular sting is suggested.  She will continue with      secondary risk reduction.  I encouraged a little more walking.  2. Hypertension.  Blood pressure is still not controlled.  To manage      this and her mildly reduced ejection fraction, I am going to add an      ACE inhibitor.  She will start on lisinopril, and we will make his      lisinopril/HCT 10/12.5 daily.  She is given written instructions to      get a BMET in 2 weeks at Dr. Anthony Sar office.  This medication can      be titrated.  I do not think a beta blocker can be titrated.  3. Cardiomyopathy.  The patient  had a mildly reduced ejection      fraction, as described above.  This will be managed in the context      of treating her hypertension.  4. Dyslipidemia.  She came fasting today, and so I am going to take      the liberty of checking a lipid profile.  The goal would be an LDL      of less than 100 and HDL greater than 50.  5. Followup.  I will see the patient back in about 3 months, or sooner      if there are any issues.  She can follow up with Dr. Lodema Hong as      well for medication titration of her blood pressure therapy.     Rollene Rotunda, MD, [facility]  Electronically Signed    JH/MedQ  DD: 11/06/2007  DT: 11/07/2007  Job #: [id number]   cc:   Wyonia Hough, M.D.

## 2011-01-09 NOTE — Assessment & Plan Note (Signed)
[facility]                            CARDIOLOGY OFFICE NOTE   NAME:[name], [name]                       MRN:          [id number]  DATE:05/22/2007                            DOB:          September 14, 1926    HISTORY OF PRESENT ILLNESS:  [name] is seen today as a new patient by  me.  I am not sure how she got on my schedule.  She is 77 years old.  She just had a prolonged hospitalization at the end of August and first  part of September.  She had a subendocardial MI treated by Dr. Juanda Chance  with stent to the diagonal branch.  She subsequently was referred for  open heart surgery by Dr. Laneta Simmers.  Her EF is 40%.   She was seen by Dr. Antoine Poche and Dr. Juanda Chance extensively during the  hospitalization.   The patient seemed to recovery well.  Since her postop course, she has  not seen Dr. Laneta Simmers.  She is due to see him on Tuesday.  She needs a  followup chest x-ray.  She continues to have aching in her chest which  sounds musculoskeletal.  The Tramadol that she has seems to be upsetting  her stomach.  She seems to have a sensitive stomach and has had problems  with aspirin in the past.   The patient has been intolerant to statins including Lipitor and was  sent home on Simvastatin.  This may be contributing as well.  She has  not had syncope, PND or orthopnea.  There has been no lower extremity  edema and no previous arrhythmias.   The patient apparently had some question of a problem with her thyroid  prior to discharge.  In talking to her, she had a nodule removed in the  past and had a TSH of 4.4.   Her LDL cholesterol was 212.   Since her discharge, she has had some nausea as well.  There have been  no vomiting, loose stools or diarrhea.   She is allergic to penicillin, Lipitor, cholesterol powder which is  probably Colestid.  She is on aspirin, Simvastatin and metoprolol.   Review of systems is otherwise negative.   PHYSICAL EXAMINATION:  GENERAL:   Elderly white female in no distress.  She is in sinus rhythm at a rate of 70.  VITAL SIGNS:  Blood pressure 130/80, afebrile, respirations 14.  HEENT:  Normal.  Carotids are normal without bruits.  There is no JVP  elevation.  No lymphadenopathy.  No thyromegaly.  LUNG:  Clear with good diaphragmatic motion.  No wheezing.  HEART:  S1, S2.  Sternum is well healed.  No obvious murmur or rub.  ABDOMEN:  Benign.  There are no epigastric or right upper quadrant pain.  No tenderness.  No bruits.  No hepatosplenomegaly.  No hepatojugular  reflux.  EXTREMITIES:  Distal pulses are intact with no edema.  Femorals are +3.  The endoscopic vein harvest site is well healed.  NEUROLOGICAL:  Nonfocal.  SKIN:  Warm and dry.  MUSCULOSKELETAL:  No muscular weakness.   STUDIES:  EKG shows sinus rhythm with poor R wave progression,  nonspecific ST-T wave changes.   IMPRESSION:  1. Stable, status post CABG.  Follow up with Dr. Laneta Simmers on Tuesday.      He will check her chest x-ray.  Continue aspirin and beta blocker.  2. Recent subendocardial MI with stenting of the large diagonal      branch.  I will leave it up to Dr. Antoine Poche as to whether she goes      back on Plavix.  I suspect she got a bare metal stent since she      subsequently was referred for CABG.  She is currently not having      chest pain and has an upset stomach, and I would not like to add      Plavix at this time.  3. Hypercholesterolemia.  Continue Simvastatin, previous intolerance      to Lipitor.  Check LFTs, lipase and amylase.  Given her GI upset at      this time, I will give her Protonix 40 mg daily to see if this      helps.  She will decrease her aspirin to 81 mg daily.  4. History of hypertension, currently stable.  Continue metoprolol 05      mg long-acting a daily.   Overall, I think the patient is doing well so long as her lab work is in  order.  She will follow up with Dr. Antoine Poche in three months.     [name].  Eden Emms, MD, [facility]  Electronically Signed    PCN/MedQ  DD: 05/22/2007  DT: 05/23/2007  Job #: [id number]   cc:   [name], [place] Dr. Wyonia [name]

## 2011-01-09 NOTE — Discharge Summary (Signed)
[name], [name]                ACCOUNT NO.:  [id number]   MEDICAL RECORD NO.:  [id number]          PATIENT TYPE:  INP   LOCATION:  2005                         FACILITY:  [facility]   PHYSICIAN:  Evelene Croon, M.D.     DATE OF BIRTH:  [birth year]   DATE OF ADMISSION:  04/24/2007  DATE OF DISCHARGE:  05/08/2007                               DISCHARGE SUMMARY   HISTORY OF PRESENT ILLNESS:  The patient is a 77 year old female who was  admitted with no prior history of coronary artery disease.  She  presented to the [facility] Emergency Department with complaints of  chest pain, EKG changes, as well as elevated cardiac markers suggested  of a non-ST elevation myocardial infarction.  She was felt to require  admission for further evaluation and treatment and after being seen by  the [facility], Dr. Tenny Craw, she was admitted to the  hospital.  Historically, she has noted some decreased exercise tolerance  and easy fatigue ability over the last 2 to 3 weeks.  On the morning of  admission, around 2 or 3 a.m. she awoke with 5/10 bilateral neck, jaw,  shoulder, and arm discomfort associated with shortness of breath,  nausea, and diaphoresis.  She got out of bed and took Tylenol and she  then felt a little better.  Approximately 12:30 in the afternoon of  admission, she however, had recurrence of the chest pain, which was very  similar and she was taken to an urgent care facility at 1:30.  An EKG  was performed and this revealed Q waves in leads V1 through V3 and an  inferolateral ST depression with minimal ST elevation in leads 1 and  AVL.  EMS was called and she was taken to the [facility] Emergency  Department.  She was treated with intravenous heparin, nitroglycerin.  First cardiac markers were positive with an MB of 44.1 and a troponin I  of 6.17.  As stated she was to be admitted for further evaluation and  treatment.   ALLERGIES:  PENICILLIN AND LIPITOR CAUSE MYALGIAS.  SHE  ALSO TOOK A  CHOLESTEROL POWDER MEDICINE, WHICH CAUSES MYALGIAS.   MEDICATIONS:  Not listed.  Medications prior to admission include  anxiety pill and also a blood pressure pill.   Family history, social history, review of systems, and physical  examination please see the history and physical done at the time of  admission.   HOSPITAL COURSE:  She was admitted and scheduled for a cardiac  catheterization.  The catheterization was to be done on the evening of  admission.  She was also started with aspirin, statin, beta blocker, ACE  inhibitors, two 3-B inhibitor, as well as the heparin and nitroglycerin.  She was taken to the cardiac cath laboratory by Dr. Juanda Chance and  procedures performed included:  1. Cardiac catheterization.  2. Percutaneous intervention.   PROCEDURE PERFORMED:  A PTCA of the diagonal coronary artery.  The  result was a 100% lesion improved to a 50% with TIMI from 0 to 3.  Other  findings however, included 3 vessel disease  and this was felt to require  further evaluation and treatment.  It was Dr. Regino Schultze opinion that this  was not a good candidate for further percutaneous intervention and  surgical consultation was then obtained with Evelene Croon, M.D.  Dr.  Laneta Simmers evaluated the patient and studies and agreed with recommendations  to proceed with surgical revascularization.  The patient was medically  stabilized and felt to be stable for surgery on May 02, 2007.   PROCEDURE:  Coronary artery bypass grafting x5.  The following grafts  were placed:  1. Left internal mammary artery to the LAD.  2. Saphenous vein graft to the diagonal.  3. Saphenous vein graft to the diagonal.  4. Saphenous vein graft to the obtuse marginal.  5. Saphenous vein graft to the posterior descending.  The patient tolerated the procedure well and was taken to the surgical  intensive care unit in stable condition.   POSTOPERATIVE HOSPITAL COURSE:  The patient has done well.  She  was  extubated without difficulty.  She has remained neurologically intact.  All inotrophs were discontinued without difficulty.  She does have  moderate volume overload, which required some diuresis, but she has  responded well.  All routine lines, monitors, and drains placed have  been discontinued in the standard fashion.  Her heart rhythm, initially  she underwent some sinus bradycardia and initially Lopressor was held  and she was paced for a short term.  This did however improve and  additionally she had episodes of supraventricular tachycardia up into  the 190 beats per minute.  This lead to the reinstitution of her beta  blocker therapy at a very low dose, but it has returned her to a normal  sinus rhythm.  The plan at this point regarding this is to continue with  the low dose beta blocker and titrate in the office setting using Halter  monitoring as required and as determined by the cardiologist.  Currently  the patient's status is felt to be quite stable.  Her incisions are  healing well without signs of infection.  She is tolerating diet and  activities, commence her for level of postoperative convalescence using  standard cardiac rehabilitation phase 1 modalities.  Her oxygen has been  weaned and she maintained good saturations on room air.  Her overall  status is felt at this time to be tentatively stable for discharge in  the morning of May 08, 2007, pending morning round reevaluation.   DISCHARGE MEDICATIONS:  Include the following:  1. Toprol XL 25 mg daily.  2. Aspirin 325 mg daily.  3. Zocor 40 mg daily.  4. Ultram 50 mg 1 or 2 every 4 to 6 hours as needed.   INSTRUCTIONS:  The patient will receive written instructions in regard  to medications, activity, diet, wound care, and followup.  Followup will  include Dr. Laneta Simmers on May 27, 2007, at 11:30 a.m. with a chest x-  ray.  Additionally she is instructed to obtain an appointment to see her  cardiologist,  Dr. Antoine Poche in 2 weeks.   CONDITION ON DISCHARGE:  Stable and improving.   FINAL DIAGNOSES:  Include non-ST segment elevation myocardial infarction  with acute percutaneous coronary intervention to the diagonal coronary  artery.  Additionally she is also status post surgical revascularization  as described.   OTHER DIAGNOSES:  Include:  1. History of hypertension.  2. Postoperative bradycardic and tachycardic dysrhythmias.  3. History of hyperlipidemia.  4. Postoperative anemia.  Most recently hemoglobin  and hematocrit      dated May 04, 2007, are 10.9 and 32 respectively.      Rowe Clack, P.A.-C.      Evelene Croon, M.D.  Electronically Signed    WEG/MEDQ  D:  05/07/2007  T:  05/07/2007  Job:  [id number]   cc:   Evelene Croon, M.D.  Rollene Rotunda, MD, [facility]

## 2011-01-09 NOTE — Op Note (Signed)
[name], [name]                ACCOUNT NO.:  [id number]   MEDICAL RECORD NO.:  [id number]          PATIENT TYPE:  INP   LOCATION:  2309                         FACILITY:  [facility]   PHYSICIAN:  Evelene Croon, M.D.     DATE OF BIRTH:  27-Aug-1927   DATE OF PROCEDURE:  05/02/2007  DATE OF DISCHARGE:                               OPERATIVE REPORT   PREOPERATIVE DIAGNOSIS:  Severe three-vessel coronary disease, status  post non-ST-segment elevation myocardial infarction and acute  percutaneous coronary intervention.   POSTOPERATIVE DIAGNOSIS:  Severe three-vessel coronary disease, status  post non-ST-segment elevation myocardial infarction and acute  percutaneous coronary intervention.   OPERATIVE PROCEDURE:  Median sternotomy, extracorporeal circulation,  coronary bypass graft surgery x5 using a left internal mammary artery  graft to the left anterior descending coronary artery, with saphenous  vein graft to both branches of the major diagonal artery, a saphenous  vein graft to the obtuse marginal branch of the left circumflex coronary  artery, and a saphenous vein graft to the posterior descending branch of  the right coronary artery.  Endoscopic vein harvesting from both legs.   ATTENDING SURGEON:  Dr. Evelene Croon.   ASSISTANT:  Lenise Herald, surgical assistant.   ANESTHESIA:  General endotracheal.   CLINICAL HISTORY:  This patient is a 77 year old white female with a  history of hypertension and hyperlipidemia that has been untreated, who  presented on April 24, 2007 with an acute non-ST segment elevation MI.  Her initial CPK was 1475 with an MB of 179.6 and troponin of 42.  She  was taken to the catheterization lab acutely and underwent cardiac  catheterization which showed severe three-vessel disease.  The culprit  appeared to be an occluded large first diagonal branch.  There was also  a long 80% proximal LAD stenosis just beyond the takeoff of this  diagonal branch.  The  left circumflex had a 70% proximal stenosis before  three marginal branches.  The right coronary had 30% proximal and 30%  midvessel stenosis.  The posterior descending branch itself had  sequential 80% stenoses.  Left ventricular ejection fraction was about  40% with anterior akinesis.  The patient underwent acute PTCA of the  diagonal branch which increased TIMI flow 0 to 3.  The degree of  stenosis went from 100% to 50%.  This revealed a large diagonal branch  with two subbranches.  The more lateral subbranch had about 80% proximal  stenosis in it.  The patient did well with that procedure and I was  consulted to consider coronary artery bypass graft surgery.  I felt that  that would be the best treatment for the patient.  I felt it would be  best to wait about four or five days to allow her heart to recover from  a significant anterolateral myocardial infarction.  She did have some  congestive heart failure and pulmonary edema over the next couple of  days, but improved with diuresis.  I discussed the operative procedure  of coronary artery bypass surgery with her and her family.  We discussed  alternatives, benefits,  and risks including, but not limited to  bleeding, blood transfusion, infection, stroke, myocardial infarction,  graft failure, and death.  She understood and agreed to proceed.   OPERATIVE PROCEDURE:  The patient was taken to the operating room and  placed on the table in supine position.  After induction of general  endotracheal anesthesia, a Foley catheter was placed in the bladder  using sterile technique.  A transesophageal echocardiogram was performed  by anesthesiology.  This showed good left ventricular function with mild  anterolateral hypokinesis.  Left ventricular function looked  significantly improved compared to that during her catheterization.  There was no mitral regurgitation and no evidence of aortic valve  disease.   Then the chest, abdomen and both  lower extremities were prepped and  draped in usual sterile manner.  The chest was entered through a median  sternotomy incision.  Pericardium opened in the midline.  Examination of  the heart showed good ventricular contractility.  The ascending aorta  had no palpable plaques in it.   Then the left internal mammary artery was harvested from the chest wall  as a pedicle graft.  This is a medium caliber vessel with excellent  blood flow through it.  At the same time, a segment of greater saphenous  vein was harvested from the right thigh using endoscopic vein harvest  technique.  This vein was of small caliber, but good quality.  Below the  knee, the vein was unsuitable.  Therefore another section of saphenous  vein was harvested from the left leg using endoscopic vein harvest  technique.  This vein was of medium caliber and in good quality.   Then the patient was heparinized and when an adequate activated clotting  time was achieved, the distal ascending aorta was cannulated using a 20-  [place] aortic cannula for arterial inflow.  Venous outflow was achieved  using a two-stage venous cannula through the right atrial appendage.  An  antegrade cardioplegia and vent cannula was inserted in the aortic root.   The patient was placed on cardiopulmonary bypass and distal coronary  artery was identified.  The LAD was intramyocardial proximally.  It did  not surface until the distal third near the apex where it was a very  small vessel.  I was able to locate this vessel in the midportion where  it was located beneath the epicardial fat and a thin layer of  myocardium.  It was a large graftable vessel here.  Both diagonal  subbranches were medium size graftable vessels.  The obtuse marginal  branches were large vessels.  The middle one of these branches was  chosen for grafting.  They all communicated well on angiogram.  The  right coronary artery was diffusely diseased and this continued out   almost to the takeoff of the posterior descending branch.  The proximal  portion of the posterior descending had some plaque corresponding to the  80% stenoses seen on angiogram.  The posterolateral branches were small  vessels.   Then the aorta was crossclamped and 1000 mL of cold blood antegrade  cardioplegia was administered in the aortic root with quick arrest of  the heart.  Systemic hypothermia to 28 degrees centigrade and topical  hypothermia with iced saline was used.  A temperature probe placed in  septum and insulating pad in the pericardium.   The first distal anastomosis was then performed to the obtuse marginal  branch.  The internal diameter was 1.75 mm.  Conduit used was  a segment  of greater saphenous vein and the anastomosis performed in end-to-side  manner using continuous 7-0 Prolene suture.  Flow was noted through the  graft and was excellent.   Second distal anastomosis was performed to the lateral diagonal  subbranch.  The internal diameter of this vessel was about 1.6 mm.  Conduit used was a second segment of greater saphenous vein and the  anastomosis performed in end-to-side manner using continuous 7-0 Prolene  suture.  Flow was measured through the graft and was excellent.   The third distal anastomosis was performed to the medial diagonal  subbranch.  The internal diameter of this vessel was 1.6 mm.  Conduit  used was another segment of greater saphenous vein and the anastomosis  performed in end-to-side manner using continuous 7-0 Prolene suture.  Flow was measured through the graft and was excellent.  Then another  dose of cardioplegia was given.   The fourth distal anastomosis was performed to the posterior descending  coronary artery.  The internal diameter distally was about 1.6 mm.  The  conduit used was a fourth segment of greater saphenous vein and the  anastomosis performed in end-to-side manner using continuous 7-0 Prolene  suture.  Flow was  measured through the graft and was excellent.   The fifth distal anastomosis was performed to the midportion of the left  anterior descending coronary artery.  The internal diameter was about 2  mm.  Conduit used was the left internal mammary graft and this was  brought through an opening in the left pericardium anterior to the  phrenic nerve.  It was anastomosed to the LAD in an end-to-side manner  using continuous 8-0 Prolene suture.  The pedicle was sutured to the  epicardium with 6-0 Prolene sutures.  The patient was rewarmed to 37  degree centigrade.  With the crossclamp in place, the four proximal vein  graft anastomoses were performed to the aortic root in end-to-side  manner using continuous 6-0 Prolene suture.  Then the clamp was removed  from the mammary pedicle.  There was rapid rewarming of the ventricular  septum and return of spontaneous ventricular fibrillation.  The  crossclamp was removed with a time of 87 minutes and there was  spontaneous return of sinus rhythm.  The proximal and distal anastomoses  appeared hemostatic and alignment of the grafts satisfactory.  Graft  markers were placed around the proximal anastomoses.  Two temporary  right ventricular and right atrial pacing wires placed and brought out  through the skin.   When the patient had rewarmed to 37 degrees centigrade, she was weaned  from cardiopulmonary bypass on low-dose dopamine.  Total bypass time was  108 minutes.  Cardiac function appeared improved.  Cardiac output was  4.5 liters per minute.  Transesophageal echocardiogram showed good left  ventricular function.  Protamine was then given and the venous and  aortic cannulae without difficulty.  Hemostasis was achieved.  Four  chest tubes were placed with bilateral pleural tubes and tube in the  posterior pericardium and one in the anterior mediastinum.  The  pericardium was reapproximated loosely over the heart.  Sternum was  closed with #6  stainless steel wires.  Fascia was closed with continuous  #1 Vicryl suture.  Subcutaneous tissue was closed with continuous 2-0  Vicryl and skin with 3-0 Vicryl subcuticular closure.  The lower  extremity vein harvest sites were closed in layers in similar manner.  The sponge, needle and instrument counts were correct  according to the  scrub nurse.  Dry sterile dressings were applied over the incisions and  around the chest tubes which were hook to Pleur-Evac suction.  The  patient remained hemodynamically stable and was transported to the SICU  in guarded but stable condition.      Evelene Croon, M.D.  Electronically Signed     BB/MEDQ  D:  05/02/2007  T:  05/02/2007  Job:  [id number]   cc:   Everardo Beals. Juanda Chance, MD, [facility]

## 2011-01-09 NOTE — Assessment & Plan Note (Signed)
[facility]                            CARDIOLOGY OFFICE NOTE   NAME:[name], [name]                       MRN:          [id number]  DATE:08/05/2007                            DOB:          March 15, 1927    PRIMARY CARE PHYSICIAN:  Wyonia Hough, MD   REASON FOR PRESENTATION:  Evaluate patient with coronary disease status  post CABG.   HISTORY OF PRESENT ILLNESS:  The patient is a pleasant 77 year old white  female status post CABG in August. She presented to [facility]  with non-ST-segment-elevation myocardial infarction.  She subsequently  had a cardiac catheterization demonstrating disease as described below.  She had CABG.  She was seen once back in followup with Dr. Eden Emms.  She  was complaining of some nausea and vomiting, and that has improved.  She  never participated in cardiac rehab because [employer] would not cover it  completely.  She has been walking on her own and does maybe a couple of  miles 5 days a week.  She says is tired at the end of this.  She thinks  she is certainly more fatigued than prior to surgery.  She is not having  any chest discomfort other than some mild incisional or skin soreness or  numbness. She is noto having any chest pressure, neck or arm discomfort.  She is having no palpitations, presyncope, or syncope.  She has had no  PND or orthopnea.   PAST MEDICAL HISTORY:  1. Coronary artery disease. (LAD totally occluded at the origin, 80%      narrowing in the LAD after the diagonal, 50% narrowing in the mid      vessel.  There was 70% narrowing in the proximal to mid circumflex.      The right coronary artery had a 30% mid stenosis followed by 80%      stenosis in the PDA.  EF was approximately 40%.  The patient      underwent PTCA of the totally occluded diagonal branch of the LAD.      She subsequently had CABG with a LIMA to the LAD, saphenous vein      graft to the diagonal, saphenous vein graft to an obtuse  marginal,      saphenous vein graft to the PDA.)  2. Hypertension.  3. Hyperlipidemia.   ALLERGIES:  1. PENICILLIN.  2. LIPITOR caused myalgias.   MEDICATIONS:  1. Aspirin 81 mg daily.  2. Zoloft 50 mg daily.  3. Simvastatin 40 mg nightly.  4. Metoprolol 50 mg daily.   REVIEW OF SYSTEMS:  As stated in the HPI, otherwise negative for other  systems.   PHYSICAL EXAMINATION:  GENERAL:  The patient is in no acute distress.  VITAL SIGNS:  Blood pressure 143/70, heart rate 54 and regular.  Weight  147 pounds.  Body mass index 25.  HEENT:  Eyelids unremarkable.  Pupils equal, round, and reactive to  light.  Fundi not visualized.  Oral mucosa unremarkable.  NECK:  No jugular venous distention. Waveform within normal limits.  Carotid upstrokes brisk and symmetric.  No bruits or thyromegaly.  LYMPHATICS:  No cervical, axillary, or inguinal adenopathy.  LUNGS:  Clear to auscultation bilaterally.  BACK:  No costovertebral angle tenderness.  CHEST:  Well-healed sternotomy scar.  HEART:  PMI not displaced or sustained.  S1 and S2 within normal limits.  No S3, no S4, no clicks, no rubs, no murmurs.  ABDOMEN:  Flat, positive bowel sounds normal in frequency and pitch.  No  bruits, rebound, guarding, or midline pulsatile mass.  No hepatomegaly,  no splenomegaly.  SKIN:  No rashes, no nodules.  EXTREMITIES:  2+ pulses throughout, bilateral lower extremity endoscopic  saphenous vein graft harvest sites intact.  NEUROLOGIC:  Grossly intact.   EKG: Sinus bradycardia, rate 54, axis within normal limits, intervals  within normal limits.  No acute ST wave changes.   ASSESSMENT AND PLAN:  1. Coronary disease.  The patient is doing well with respect to this.      No further cardiovascular testing is suggested.  She will continue      with risk reduction.  2. Dyslipidemia.  Her LDL was 109.  I would like this to be less than      100.  However, her HDL was in the 50s.  At this point, I am not       going to change her regimen but would encourage continued diet and      exercise with a goal LDL of less than 100 an keeping the HDL where      it is.  3. Hypertension.  Blood pressure is slightly elevated.  She said she      felt somewhat weak on the triamterene/hydrochlorothiazide.  I am      going to take the liberty of just giving her hydrochlorothiazide      12.5 mg and hopefully get her to the target of less than 140/90      consistently and an ideal of 120s/70s.  4. Followup:  I will see the patient back in about 4 months or sooner      if needed.     Rollene Rotunda, MD, [facility]  Electronically Signed    JH/MedQ  DD: 08/05/2007  DT: 08/05/2007  Job #: [id number]   cc:   Wyonia Hough, MD

## 2011-01-12 NOTE — Op Note (Signed)
[place]  Patient:    [name], [name] Visit Number: [id number] MRN: [id number]          Service Type: Attending:  Sharlot Gowda., M.D. Dictated by:   Sharlot Gowda., M.D. Proc. Date: 05/30/01                             Operative Report  PREOPERATIVE DIAGNOSES: 1. Large rotator cuff tear, right shoulder. 2. Mild glenohumeral arthritis with tearing anterior superior labrum. 3. Impingement, acromioclavicular arthritis.  POSTOPERATIVE DIAGNOSES: 1. Large rotator cuff tear, right shoulder. 2. Mild glenohumeral arthritis with tearing anterior superior labrum. 3. Impingement, acromioclavicular arthritis. 4. Raised nodular lesion (mole) in the line of the incision.  PROCEDURES: 1. Open acromioplasty and rotator cuff repair. 2. Open distal clavicle excision. 3. Arthroscopic debridement, glenohumeral joint and torn labrum. 4. Excisional biopsy of mole of right shoulder.  SURGEON:  Sharlot Gowda., M.D.  ANESTHESIA:  General with a stellate block.  INDICATION:  The patient is a 77 year old with MRI-proven symptomatic right rotator cuff tear, thought to be amenable to overnight hospitalization.  DESCRIPTION OF PROCEDURE:  Examination under anesthesia showed basically normal range of motion.  She was arthroscoped through a posterolateral portal. Intra-articular inspection of the joint showed very mild degenerative changes of the glenohumeral joint that seemed to be more preferentially biased in the superior quadrant of the joint.  The biceps tendon anchor was intact, but there was fraying-type tearing of the labrum requiring debridement.  There was a large complete tear of the supraspinatus with very slight retraction, with interstitial degeneration of the tendon as well as some involvement of the longitudinal split between the supraspinatus and infraspinatus.  The procedure was next converted to an open procedure with a  deltoid-splitting approach, and she had exposure of the tear, which was large.  The edges were freshened with a 15 blade, burring of the superior surface of the tuberosity, followed by placement of Arthrex absorbable anchors, each with two #2 sutures attached and additional anchor stitch placed on the lateral aspect of the humerus.  Three anchors and six sutures were removed, oversewn, and split in the supraspinatus-infraspinatus interval with #1 Tycron.  The deltoid was reapproximated with #1 Tycron, the subcutaneous tissues with 3-0 Vicryl, and the skin with Monocryl running stitch.  Marcaine with epinephrine was infiltrated in the skin as well as in the joint.  ADDENDUM:  Additional diagnosis made of the procedure include large nodular mole in line of the incision.  DESCRIPTION OF PROCEDURE:   The patient had a large mole.  It was raised.  It had some color irregularity, but the borders were not extremely irregular. However, it was in line of the incision and, in point of fact, slightly distal to the tip of the incision but just over a distance of a centimeter.  It was excised and sent to pathology.  It was excised with a small skin flap on each side, sent to pathology for further examination. Dictated by:   Sharlot Gowda., M.D. Attending:  Sharlot Gowda., M.D. DD:  05/30/01 TD:  05/30/01 Job: [id number]

## 2011-06-08 LAB — COMPREHENSIVE METABOLIC PANEL
ALT: 73 — ABNORMAL HIGH
ALT: 46 — ABNORMAL HIGH
AST: 66 — ABNORMAL HIGH
AST: 118 — ABNORMAL HIGH
Albumin: 2.5 — ABNORMAL LOW
Albumin: 3.5
Alkaline Phosphatase: 159 — ABNORMAL HIGH
Alkaline Phosphatase: 74
BUN: 15
BUN: 13
CO2: 30
CO2: 27
Calcium: 8.3 — ABNORMAL LOW
Calcium: 9
Chloride: 93 — ABNORMAL LOW
Chloride: 104
Creatinine, Ser: 0.94
Creatinine, Ser: 0.88
GFR calc Af Amer: >60
GFR calc Af Amer: >60
GFR calc non Af Amer: 58 — ABNORMAL LOW
GFR calc non Af Amer: >60
Glucose, Bld: 149 — ABNORMAL HIGH
Glucose, Bld: 156 — ABNORMAL HIGH
Potassium: 3.6
Potassium: 3.9
Sodium: 132 — ABNORMAL LOW
Sodium: 139
Total Bilirubin: 0.6
Total Bilirubin: 1
Total Protein: 6.6
Total Protein: 6.7

## 2011-06-08 LAB — POCT I-STAT 3, ART BLOOD GAS (G3+)
Acid-Base Excess: 2
Acid-base deficit: 2
Acid-base deficit: 4 — ABNORMAL HIGH
Bicarbonate: 19.8 — ABNORMAL LOW
Bicarbonate: 21.4
Bicarbonate: 25.2 — ABNORMAL HIGH
Bicarbonate: 25.3 — ABNORMAL HIGH
O2 Saturation: 100
O2 Saturation: 100
O2 Saturation: 94
O2 Saturation: 97
Operator id: 256891
Operator id: 297551
Operator id: 3342
Operator id: 3342
Patient temperature: 35.6
Patient temperature: 37
TCO2: 21
TCO2: 22
TCO2: 26
TCO2: 26
pCO2 arterial: 30.1 — ABNORMAL LOW
pCO2 arterial: 30.9 — ABNORMAL LOW
pCO2 arterial: 34.7 — ABNORMAL LOW
pCO2 arterial: 39.8
pH, Arterial: 7.409 — ABNORMAL HIGH
pH, Arterial: 7.427 — ABNORMAL HIGH
pH, Arterial: 7.442 — ABNORMAL HIGH
pH, Arterial: 7.471 — ABNORMAL HIGH
pO2, Arterial: 251 — ABNORMAL HIGH
pO2, Arterial: 344 — ABNORMAL HIGH
pO2, Arterial: 63 — ABNORMAL LOW
pO2, Arterial: 91

## 2011-06-08 LAB — POCT I-STAT 4, (NA,K, GLUC, HGB,HCT)
Glucose, Bld: 101 — ABNORMAL HIGH
Glucose, Bld: 103 — ABNORMAL HIGH
Glucose, Bld: 105 — ABNORMAL HIGH
Glucose, Bld: 116 — ABNORMAL HIGH
Glucose, Bld: 93
Glucose, Bld: 99
HCT: 23 — ABNORMAL LOW
HCT: 24 — ABNORMAL LOW
HCT: 26 — ABNORMAL LOW
HCT: 28 — ABNORMAL LOW
HCT: 31 — ABNORMAL LOW
HCT: 34 — ABNORMAL LOW
Hemoglobin: 10.5 — ABNORMAL LOW
Hemoglobin: 11.6 — ABNORMAL LOW
Hemoglobin: 7.8 — CL
Hemoglobin: 8.2 — ABNORMAL LOW
Hemoglobin: 8.8 — ABNORMAL LOW
Hemoglobin: 9.5 — ABNORMAL LOW
Operator id: 297551
Operator id: 3342
Operator id: 3342
Operator id: 3342
Operator id: 3342
Operator id: 3342
Potassium: 3.8
Potassium: 3.8
Potassium: 3.9
Potassium: 4.3
Potassium: 5.1
Potassium: 5.3 — ABNORMAL HIGH
Sodium: 127 — ABNORMAL LOW
Sodium: 127 — ABNORMAL LOW
Sodium: 130 — ABNORMAL LOW
Sodium: 131 — ABNORMAL LOW
Sodium: 132 — ABNORMAL LOW
Sodium: 134 — ABNORMAL LOW

## 2011-06-08 LAB — I-STAT 8, (EC8 V) (CONVERTED LAB)
Acid-base deficit: 1
BUN: 14
Bicarbonate: 25.2 — ABNORMAL HIGH
Chloride: 103
Glucose, Bld: 160 — ABNORMAL HIGH
HCT: 37
Hemoglobin: 12.6
Operator id: 136891
Potassium: 5.2 — ABNORMAL HIGH
Sodium: 136
TCO2: 27
pCO2, Ven: 47
pH, Ven: 7.338 — ABNORMAL HIGH

## 2011-06-08 LAB — CARDIAC PANEL(CRET KIN+CKTOT+MB+TROPI)
CK, MB: 106.6 — ABNORMAL HIGH
CK, MB: 58 — ABNORMAL HIGH
Relative Index: 7 — ABNORMAL HIGH
Relative Index: 9.9 — ABNORMAL HIGH
Total CK: 1082 — ABNORMAL HIGH
Total CK: 824 — ABNORMAL HIGH
Troponin I: 23.7
Troponin I: 35.99

## 2011-06-08 LAB — POCT I-STAT 3, VENOUS BLOOD GAS (G3P V)
Acid-base deficit: 1
Bicarbonate: 22.6
O2 Saturation: 91
Operator id: 3342
TCO2: 24
pCO2, Ven: 34.1 — ABNORMAL LOW
pH, Ven: 7.429 — ABNORMAL HIGH
pO2, Ven: 59 — ABNORMAL HIGH

## 2011-06-08 LAB — CBC
HCT: 30.2 — ABNORMAL LOW
HCT: 31.7 — ABNORMAL LOW
HCT: 32 — ABNORMAL LOW
HCT: 32.5 — ABNORMAL LOW
HCT: 33 — ABNORMAL LOW
HCT: 33.8 — ABNORMAL LOW
HCT: 35.3 — ABNORMAL LOW
HCT: 35.5 — ABNORMAL LOW
HCT: 35.8 — ABNORMAL LOW
HCT: 31.8 — ABNORMAL LOW
HCT: 32.6 — ABNORMAL LOW
HCT: 38.8
HCT: 44.3
Hemoglobin: 10.4 — ABNORMAL LOW
Hemoglobin: 10.9 — ABNORMAL LOW
Hemoglobin: 10.9 — ABNORMAL LOW
Hemoglobin: 11.2 — ABNORMAL LOW
Hemoglobin: 11.3 — ABNORMAL LOW
Hemoglobin: 11.6 — ABNORMAL LOW
Hemoglobin: 11.9 — ABNORMAL LOW
Hemoglobin: 12.1
Hemoglobin: 12.1
Hemoglobin: 10.8 — ABNORMAL LOW
Hemoglobin: 11.2 — ABNORMAL LOW
Hemoglobin: 13.3
Hemoglobin: 14.9
MCHC: 33.7
MCHC: 33.8
MCHC: 33.9
MCHC: 34
MCHC: 34.1
MCHC: 34.3
MCHC: 34.4
MCHC: 34.5
MCHC: 34.6
MCHC: 33.7
MCHC: 34.1
MCHC: 34.3
MCHC: 34.4
MCV: 87.3
MCV: 87.6
MCV: 87.6
MCV: 87.9
MCV: 88
MCV: 88.2
MCV: 88.9
MCV: 89
MCV: 89.2
MCV: 88.3
MCV: 88.4
MCV: 88.5
MCV: 89
Platelets: 227
Platelets: 234
Platelets: 261
Platelets: 279
Platelets: 295
Platelets: 306
Platelets: 307
Platelets: 312
Platelets: 415 — ABNORMAL HIGH
Platelets: 239
Platelets: 246
Platelets: 346
Platelets: 355
RBC: 3.45 — ABNORMAL LOW
RBC: 3.6 — ABNORMAL LOW
RBC: 3.6 — ABNORMAL LOW
RBC: 3.7 — ABNORMAL LOW
RBC: 3.78 — ABNORMAL LOW
RBC: 3.87
RBC: 3.96
RBC: 4.02
RBC: 4.02
RBC: 3.6 — ABNORMAL LOW
RBC: 3.67 — ABNORMAL LOW
RBC: 4.38
RBC: 5.01
RDW: 13.6
RDW: 13.7
RDW: 13.8
RDW: 13.8
RDW: 13.8
RDW: 13.9
RDW: 14
RDW: 14.3 — ABNORMAL HIGH
RDW: 14.5 — ABNORMAL HIGH
RDW: 13.7
RDW: 13.9
RDW: 13.9
RDW: 13.9
WBC: 11.6 — ABNORMAL HIGH
WBC: 11.7 — ABNORMAL HIGH
WBC: 12.4 — ABNORMAL HIGH
WBC: 12.7 — ABNORMAL HIGH
WBC: 12.9 — ABNORMAL HIGH
WBC: 13.3 — ABNORMAL HIGH
WBC: 13.8 — ABNORMAL HIGH
WBC: 14.7 — ABNORMAL HIGH
WBC: 15.1 — ABNORMAL HIGH
WBC: 11.9 — ABNORMAL HIGH
WBC: 12.4 — ABNORMAL HIGH
WBC: 15 — ABNORMAL HIGH
WBC: 15.1 — ABNORMAL HIGH

## 2011-06-08 LAB — BASIC METABOLIC PANEL
BUN: 10
BUN: 11
BUN: 11
BUN: 11
BUN: 14
BUN: 13
BUN: 16
BUN: 21
CO2: 24
CO2: 27
CO2: 28
CO2: 31
CO2: 33 — ABNORMAL HIGH
CO2: 25
CO2: 26
CO2: 27
Calcium: 7.1 — ABNORMAL LOW
Calcium: 7.5 — ABNORMAL LOW
Calcium: 7.8 — ABNORMAL LOW
Calcium: 8.5
Calcium: 8.6
Calcium: 8.5
Calcium: 8.6
Calcium: 9.7
Chloride: 100
Chloride: 107
Chloride: 109
Chloride: 96
Chloride: 99
Chloride: 100
Chloride: 103
Chloride: 104
Creatinine, Ser: 0.72
Creatinine, Ser: 0.75
Creatinine, Ser: 0.77
Creatinine, Ser: 0.94
Creatinine, Ser: 0.99
Creatinine, Ser: 0.79
Creatinine, Ser: 0.97
Creatinine, Ser: 1.05
GFR calc Af Amer: >60
GFR calc Af Amer: >60
GFR calc Af Amer: >60
GFR calc Af Amer: >60
GFR calc Af Amer: >60
GFR calc Af Amer: >60
GFR calc Af Amer: >60
GFR calc Af Amer: >60
GFR calc non Af Amer: 54 — ABNORMAL LOW
GFR calc non Af Amer: 58 — ABNORMAL LOW
GFR calc non Af Amer: >60
GFR calc non Af Amer: >60
GFR calc non Af Amer: >60
GFR calc non Af Amer: 51 — ABNORMAL LOW
GFR calc non Af Amer: 56 — ABNORMAL LOW
GFR calc non Af Amer: >60
Glucose, Bld: 114 — ABNORMAL HIGH
Glucose, Bld: 114 — ABNORMAL HIGH
Glucose, Bld: 92
Glucose, Bld: 93
Glucose, Bld: 99
Glucose, Bld: 108 — ABNORMAL HIGH
Glucose, Bld: 120 — ABNORMAL HIGH
Glucose, Bld: 139 — ABNORMAL HIGH
Potassium: 3.5
Potassium: 3.5
Potassium: 4
Potassium: 4
Potassium: 4.7
Potassium: 3.9
Potassium: 3.9
Potassium: 4
Sodium: 135
Sodium: 136
Sodium: 138
Sodium: 139
Sodium: 141
Sodium: 135
Sodium: 136
Sodium: 138

## 2011-06-08 LAB — TYPE AND SCREEN
ABO/RH(D): O NEG
Antibody Screen: POSITIVE
DAT, IgG: NEGATIVE

## 2011-06-08 LAB — URINE MICROSCOPIC-ADD ON

## 2011-06-08 LAB — CREATININE, SERUM
Creatinine, Ser: 0.79
Creatinine, Ser: 0.79
GFR calc Af Amer: >60
GFR calc Af Amer: >60
GFR calc non Af Amer: >60
GFR calc non Af Amer: >60

## 2011-06-08 LAB — BLOOD GAS, ARTERIAL
Acid-Base Excess: 3.9 — ABNORMAL HIGH
Bicarbonate: 26.7 — ABNORMAL HIGH
O2 Content: 5
O2 Saturation: 96.7
Patient temperature: 98.6
TCO2: 27.6
pCO2 arterial: 31.9 — ABNORMAL LOW
pH, Arterial: 7.532 — ABNORMAL HIGH
pO2, Arterial: 81.3

## 2011-06-08 LAB — DIFFERENTIAL
Basophils Absolute: 0
Basophils Relative: 0
Eosinophils Absolute: 0
Eosinophils Relative: 0
Lymphocytes Relative: 15
Lymphs Abs: 2.2
Monocytes Absolute: 0.9 — ABNORMAL HIGH
Monocytes Relative: 6
Neutro Abs: 11.8 — ABNORMAL HIGH
Neutrophils Relative %: 79 — ABNORMAL HIGH

## 2011-06-08 LAB — PROTIME-INR
INR: 1
INR: 1.4
INR: 0.9
Prothrombin Time: 13.5
Prothrombin Time: 17.4 — ABNORMAL HIGH
Prothrombin Time: 12

## 2011-06-08 LAB — LIPID PANEL
Cholesterol: 297 — ABNORMAL HIGH
HDL: 47
LDL Cholesterol: 212 — ABNORMAL HIGH
Total CHOL/HDL Ratio: 6.3
Triglycerides: 189 — ABNORMAL HIGH
VLDL: 38

## 2011-06-08 LAB — URINALYSIS, ROUTINE W REFLEX MICROSCOPIC
Bilirubin Urine: NEGATIVE
Glucose, UA: NEGATIVE
Ketones, ur: NEGATIVE
Nitrite: NEGATIVE
Protein, ur: 100 — AB
Specific Gravity, Urine: 1.02
Urobilinogen, UA: 2 — ABNORMAL HIGH
pH: 6

## 2011-06-08 LAB — PLATELET COUNT: Platelets: 204

## 2011-06-08 LAB — HEMOGLOBIN A1C
Hgb A1c MFr Bld: 6.4 — ABNORMAL HIGH
Mean Plasma Glucose: 151

## 2011-06-08 LAB — TSH
TSH: 8.686 — ABNORMAL HIGH
TSH: 4.485

## 2011-06-08 LAB — URINE CULTURE: Colony Count: >=100000

## 2011-06-08 LAB — POCT CARDIAC MARKERS
CKMB, poc: 44.1
Myoglobin, poc: 306
Operator id: 294501
Troponin i, poc: 6.17

## 2011-06-08 LAB — APTT
aPTT: 35
aPTT: 38 — ABNORMAL HIGH
aPTT: 33

## 2011-06-08 LAB — MAGNESIUM
Magnesium: 2.2
Magnesium: 2.4
Magnesium: 2.5
Magnesium: 3 — ABNORMAL HIGH

## 2011-06-08 LAB — POCT I-STAT GLUCOSE
Glucose, Bld: 100 — ABNORMAL HIGH
Glucose, Bld: 100 — ABNORMAL HIGH
Operator id: 125961
Operator id: 3342

## 2011-06-08 LAB — CK TOTAL AND CKMB (NOT AT ARMC)
CK, MB: 179.6 — ABNORMAL HIGH
Relative Index: 12.2 — ABNORMAL HIGH
Total CK: 1475 — ABNORMAL HIGH

## 2011-06-08 LAB — B-NATRIURETIC PEPTIDE (CONVERTED LAB): Pro B Natriuretic peptide (BNP): 729 — ABNORMAL HIGH

## 2011-06-08 LAB — TROPONIN I: Troponin I: 42.01

## 2011-06-08 LAB — HEMOGLOBIN AND HEMATOCRIT, BLOOD
HCT: 23 — ABNORMAL LOW
Hemoglobin: 8 — ABNORMAL LOW

## 2011-07-25 ENCOUNTER — Encounter: Payer: Self-pay | Admitting: Cardiology

## 2011-07-31 ENCOUNTER — Ambulatory Visit (INDEPENDENT_AMBULATORY_CARE_PROVIDER_SITE_OTHER): Payer: Medicare Other | Admitting: Cardiology

## 2011-07-31 ENCOUNTER — Encounter: Payer: Self-pay | Admitting: Cardiology

## 2011-07-31 DIAGNOSIS — I10 Essential (primary) hypertension: Secondary | ICD-10-CM

## 2011-07-31 DIAGNOSIS — I2510 Atherosclerotic heart disease of native coronary artery without angina pectoris: Secondary | ICD-10-CM

## 2011-07-31 DIAGNOSIS — E785 Hyperlipidemia, unspecified: Secondary | ICD-10-CM

## 2011-07-31 NOTE — Patient Instructions (Signed)
The current medical regimen is effective;  continue present plan and medications.  Follow up in 1 year with Dr Hochrein.  You will receive a letter in the mail 2 months before you are due.  Please call us when you receive this letter to schedule your follow up appointment.  

## 2011-07-31 NOTE — Assessment & Plan Note (Signed)
Blood pressure is well controlled. She will continue on the meds as listed.

## 2011-07-31 NOTE — Progress Notes (Signed)
   HPI The patient presents for evaulation of known coronary disease. Since I last saw her she has done well. She doesn't exercise routinely but she is relatively active. With this level of activity she gets none of the discomfort that was her previous angina.  The patient denies any new symptoms such as chest discomfort, neck or arm discomfort. There has been no new shortness of breath, PND or orthopnea. There have been no reported palpitations, presyncope or syncope.    Allergies  Allergen Reactions  . Atorvastatin   . Penicillins     Current Outpatient Prescriptions  Medication Sig Dispense Refill  . aspirin 81 MG tablet Take 81 mg by mouth daily.        . diclofenac (VOLTAREN) 0.1 % ophthalmic solution       . fish oil-omega-3 fatty acids 1000 MG capsule Take 2 g by mouth daily.        . furosemide (LASIX) 20 MG tablet Take 20 mg by mouth 2 (two) times daily.        Marland Kitchen lisinopril-hydrochlorothiazide (PRINZIDE,ZESTORETIC) 10-12.5 MG per tablet Take 1 tablet by mouth daily.        Marland Kitchen LORazepam (ATIVAN) 0.5 MG tablet Take 0.5 mg by mouth every 8 (eight) hours.        . metoprolol tartrate (LOPRESSOR) 25 MG tablet Take 25 mg by mouth 2 (two) times daily.        Marland Kitchen omeprazole (PRILOSEC) 20 MG capsule Take 20 mg by mouth daily.        . ONE TOUCH ULTRA TEST test strip       . ONETOUCH DELICA LANCETS MISC       . sertraline (ZOLOFT) 50 MG tablet Take 50 mg by mouth daily.        . simvastatin (ZOCOR) 80 MG tablet Take 80 mg by mouth at bedtime.          No past medical history on file.  No past surgical history on file.  ROS:  As stated in the HPI and negative for all other systems.  PHYSICAL EXAM BP 106/60  Pulse 56  Resp 18  Ht 5\' 4"  (1.626 m)  Wt 155 lb (70.308 kg)  BMI 26.61 kg/m2 GENERAL:  Well appearing HEENT:  Pupils equal round and reactive, fundi not visualized, oral mucosa unremarkable NECK:  No jugular venous distention, waveform within normal limits, carotid upstroke  brisk and symmetric, no bruits, no thyromegaly LYMPHATICS:  No cervical, inguinal adenopathy LUNGS:  Clear to auscultation bilaterally BACK:  No CVA tenderness CHEST:  Well healed sternotomy scar. HEART:  PMI not displaced or sustained,S1 and S2 within normal limits, no S3, no S4, no clicks, no rubs, no murmurs ABD:  Flat, positive bowel sounds normal in frequency in pitch, no bruits, no rebound, no guarding, no midline pulsatile mass, no hepatomegaly, no splenomegaly EXT:  2 plus pulses throughout, no edema, no cyanosis no clubbing SKIN:  No rashes no nodules NEURO:  Cranial nerves II through XII grossly intact, motor grossly intact throughout PSYCH:  Cognitively intact, oriented to person place and time  EKG:  Sinus rhythm, rate 56, axis within normal limits, intervals within normal limits, no acute ST-T wave changes.   ASSESSMENT AND PLAN

## 2011-07-31 NOTE — Assessment & Plan Note (Signed)
Her lipids she reports were well controlled recently. I would like to review these with a goal LDL less than 100 and HDL greater than 40.

## 2011-07-31 NOTE — Assessment & Plan Note (Signed)
She has no new symptoms. We will continue aggressive risk reduction. I have encouraged increased exercise.

## 2012-07-21 ENCOUNTER — Ambulatory Visit: Payer: Medicare Other | Admitting: Cardiology

## 2012-08-07 ENCOUNTER — Ambulatory Visit (INDEPENDENT_AMBULATORY_CARE_PROVIDER_SITE_OTHER): Payer: Medicare Other | Admitting: Cardiology

## 2012-08-07 ENCOUNTER — Encounter: Payer: Self-pay | Admitting: Cardiology

## 2012-08-07 VITALS — BP 119/60 | HR 59 | Ht 64.0 in | Wt 156.8 lb

## 2012-08-07 DIAGNOSIS — R0602 Shortness of breath: Secondary | ICD-10-CM

## 2012-08-07 DIAGNOSIS — E785 Hyperlipidemia, unspecified: Secondary | ICD-10-CM

## 2012-08-07 DIAGNOSIS — I2510 Atherosclerotic heart disease of native coronary artery without angina pectoris: Secondary | ICD-10-CM

## 2012-08-07 DIAGNOSIS — R0989 Other specified symptoms and signs involving the circulatory and respiratory systems: Secondary | ICD-10-CM

## 2012-08-07 DIAGNOSIS — I10 Essential (primary) hypertension: Secondary | ICD-10-CM

## 2012-08-07 NOTE — Progress Notes (Signed)
HPI The patient presents for evaulation of known coronary disease. Since I last saw her she has done well. She unfortunately has been somewhat limited by back pain. She can walk about 50 yards. With this level of activity she gets none of the discomfort that was her previous angina.  The patient denies any new symptoms such as chest discomfort, neck or arm discomfort. There has been no new shortness of breath, PND or orthopnea. There have been no reported palpitations, presyncope or syncope.    Allergies  Allergen Reactions  . Atorvastatin   . Penicillins     Current Outpatient Prescriptions  Medication Sig Dispense Refill  . aspirin 81 MG tablet Take 81 mg by mouth daily.        . fish oil-omega-3 fatty acids 1000 MG capsule Take 2 g by mouth daily.        . furosemide (LASIX) 20 MG tablet Take 20 mg by mouth 2 (two) times daily.        Marland Kitchen lisinopril-hydrochlorothiazide (PRINZIDE,ZESTORETIC) 10-12.5 MG per tablet Take 1 tablet by mouth daily.        Marland Kitchen LORazepam (ATIVAN) 0.5 MG tablet Take 0.5 mg by mouth every 8 (eight) hours.        . metoprolol tartrate (LOPRESSOR) 25 MG tablet Take 25 mg by mouth 2 (two) times daily.        Marland Kitchen omeprazole (PRILOSEC) 20 MG capsule Take 20 mg by mouth daily.        . ONE TOUCH ULTRA TEST test strip       . ONETOUCH DELICA LANCETS MISC       . sertraline (ZOLOFT) 50 MG tablet Take 50 mg by mouth daily.          Past Medical History  Diagnosis Date  . CAD (coronary artery disease)   . HTN (hypertension)   . Dyslipidemia   . Anemia   . Cardiomyopathy, ischemic     EF 40% at the time cath    Past Surgical History  Procedure Date  . Coronary artery bypass graft     LIMA to the LAD, SVG to diagonal, SVG to obtuse marginal, SVG to PDA.    ROS:  As stated in the HPI and negative for all other systems.  PHYSICAL EXAM BP 119/60  Pulse 59  Ht 5\' 4"  (1.626 m)  Wt 156 lb 12.8 oz (71.124 kg)  BMI 26.91 kg/m2 GENERAL:  Well appearing HEENT:   Pupils equal round and reactive, fundi not visualized, oral mucosa unremarkable NECK:  No jugular venous distention, waveform within normal limits, carotid upstroke brisk and symmetric, soft bilateral bruits, no thyromegaly or LYMPHATICS:  No cervical, inguinal adenopathy LUNGS:  Clear to auscultation bilaterally BACK:  No CVA tenderness CHEST:  Well healed sternotomy scar. HEART:  PMI not displaced or sustained,S1 and S2 within normal limits, no S3, no S4, no clicks, no rubs, no murmurs ABD:  Flat, positive bowel sounds normal in frequency in pitch, no bruits, no rebound, no guarding, no midline pulsatile mass, no hepatomegaly, no splenomegaly EXT:  2 plus pulses throughout, no edema, no cyanosis no clubbing   EKG:  Sinus rhythm, rate 59, axis within normal limits, intervals within normal limits, no acute ST-T wave changes, complete right bundle branch block is new compared with the previous incomplete block.  08/07/2012.   ASSESSMENT AND PLAN  CAD - She has no new symptoms. We will continue aggressive risk reduction. No further studies are indicated at  this time.   HYPERTENSION - Blood pressure is well controlled. She will continue on the meds as listed.  HYPERLIPIDEMIA -  She does not want to take statins.  I will defer management of her lipids to Blane Ohara, MD  BRUIT -  I will order carotid Doppler

## 2012-08-07 NOTE — Patient Instructions (Addendum)

## 2012-08-13 ENCOUNTER — Encounter (INDEPENDENT_AMBULATORY_CARE_PROVIDER_SITE_OTHER): Payer: Medicare Other

## 2012-08-13 DIAGNOSIS — I6529 Occlusion and stenosis of unspecified carotid artery: Secondary | ICD-10-CM

## 2012-08-13 DIAGNOSIS — R0989 Other specified symptoms and signs involving the circulatory and respiratory systems: Secondary | ICD-10-CM

## 2012-12-16 ENCOUNTER — Encounter: Payer: Self-pay | Admitting: Cardiology

## 2013-03-20 ENCOUNTER — Emergency Department (HOSPITAL_COMMUNITY)
Admission: EM | Admit: 2013-03-20 | Discharge: 2013-03-20 | Disposition: A | Discharge status: Home / Self Care | Payer: Medicare Other | Attending: Emergency Medicine | Admitting: Emergency Medicine

## 2013-03-20 ENCOUNTER — Encounter (HOSPITAL_COMMUNITY): Payer: Self-pay | Admitting: Emergency Medicine

## 2013-03-20 DIAGNOSIS — R300 Dysuria: Secondary | ICD-10-CM | POA: Insufficient documentation

## 2013-03-20 DIAGNOSIS — Z79899 Other long term (current) drug therapy: Secondary | ICD-10-CM | POA: Insufficient documentation

## 2013-03-20 DIAGNOSIS — G8929 Other chronic pain: Secondary | ICD-10-CM | POA: Insufficient documentation

## 2013-03-20 DIAGNOSIS — M545 Low back pain, unspecified: Secondary | ICD-10-CM | POA: Insufficient documentation

## 2013-03-20 DIAGNOSIS — Z880 Allergy status to penicillin: Secondary | ICD-10-CM | POA: Insufficient documentation

## 2013-03-20 DIAGNOSIS — Z862 Personal history of diseases of the blood and blood-forming organs and certain disorders involving the immune mechanism: Secondary | ICD-10-CM | POA: Insufficient documentation

## 2013-03-20 DIAGNOSIS — Z8639 Personal history of other endocrine, nutritional and metabolic disease: Secondary | ICD-10-CM | POA: Insufficient documentation

## 2013-03-20 DIAGNOSIS — M79609 Pain in unspecified limb: Secondary | ICD-10-CM | POA: Insufficient documentation

## 2013-03-20 DIAGNOSIS — I2510 Atherosclerotic heart disease of native coronary artery without angina pectoris: Secondary | ICD-10-CM | POA: Insufficient documentation

## 2013-03-20 DIAGNOSIS — Z951 Presence of aortocoronary bypass graft: Secondary | ICD-10-CM | POA: Insufficient documentation

## 2013-03-20 DIAGNOSIS — Z7982 Long term (current) use of aspirin: Secondary | ICD-10-CM | POA: Insufficient documentation

## 2013-03-20 DIAGNOSIS — I10 Essential (primary) hypertension: Secondary | ICD-10-CM | POA: Insufficient documentation

## 2013-03-20 DIAGNOSIS — M79604 Pain in right leg: Secondary | ICD-10-CM

## 2013-03-20 DIAGNOSIS — N390 Urinary tract infection, site not specified: Secondary | ICD-10-CM | POA: Insufficient documentation

## 2013-03-20 DIAGNOSIS — Z8679 Personal history of other diseases of the circulatory system: Secondary | ICD-10-CM | POA: Insufficient documentation

## 2013-03-20 LAB — CBC WITH DIFFERENTIAL/PLATELET
Basophils Absolute: 0 10*3/uL (ref 0.0–0.1)
Basophils Relative: 0 % (ref 0–1)
Eosinophils Absolute: 0.1 10*3/uL (ref 0.0–0.7)
Eosinophils Relative: 2 % (ref 0–5)
HCT: 33.2 % — ABNORMAL LOW (ref 36.0–46.0)
Hemoglobin: 10.7 g/dL — ABNORMAL LOW (ref 12.0–15.0)
Lymphocytes Relative: 29 % (ref 12–46)
Lymphs Abs: 1.7 10*3/uL (ref 0.7–4.0)
MCH: 23.7 pg — ABNORMAL LOW (ref 26.0–34.0)
MCHC: 32.2 g/dL (ref 30.0–36.0)
MCV: 73.6 fL — ABNORMAL LOW (ref 78.0–100.0)
Monocytes Absolute: 0.6 10*3/uL (ref 0.1–1.0)
Monocytes Relative: 11 % (ref 3–12)
Neutro Abs: 3.5 10*3/uL (ref 1.7–7.7)
Neutrophils Relative %: 59 % (ref 43–77)
Platelets: 264 10*3/uL (ref 150–400)
RBC: 4.51 MIL/uL (ref 3.87–5.11)
RDW: 18.6 % — ABNORMAL HIGH (ref 11.5–15.5)
WBC: 6 10*3/uL (ref 4.0–10.5)

## 2013-03-20 LAB — COMPREHENSIVE METABOLIC PANEL
ALT: 24 U/L (ref 0–35)
AST: 33 U/L (ref 0–37)
Albumin: 3.3 g/dL — ABNORMAL LOW (ref 3.5–5.2)
Alkaline Phosphatase: 72 U/L (ref 39–117)
BUN: 14 mg/dL (ref 6–23)
CO2: 24 mEq/L (ref 19–32)
Calcium: 9.2 mg/dL (ref 8.4–10.5)
Chloride: 98 mEq/L (ref 96–112)
Creatinine, Ser: 0.81 mg/dL (ref 0.50–1.10)
GFR calc Af Amer: 76 mL/min — ABNORMAL LOW (ref >90)
GFR calc non Af Amer: 65 mL/min — ABNORMAL LOW (ref >90)
Glucose, Bld: 92 mg/dL (ref 70–99)
Potassium: 4.5 mEq/L (ref 3.5–5.1)
Sodium: 133 mEq/L — ABNORMAL LOW (ref 135–145)
Total Bilirubin: 0.2 mg/dL — ABNORMAL LOW (ref 0.3–1.2)
Total Protein: 6.7 g/dL (ref 6.0–8.3)

## 2013-03-20 LAB — URINALYSIS, ROUTINE W REFLEX MICROSCOPIC
Bilirubin Urine: NEGATIVE
Glucose, UA: NEGATIVE mg/dL
Hgb urine dipstick: NEGATIVE
Ketones, ur: NEGATIVE mg/dL
Nitrite: NEGATIVE
Protein, ur: NEGATIVE mg/dL
Specific Gravity, Urine: 1.011 (ref 1.005–1.030)
Urobilinogen, UA: 0.2 mg/dL (ref 0.0–1.0)
pH: 7.5 (ref 5.0–8.0)

## 2013-03-20 LAB — URINE MICROSCOPIC-ADD ON

## 2013-03-20 LAB — SEDIMENTATION RATE: Sed Rate: 17 mm/hr (ref 0–22)

## 2013-03-20 MED ORDER — CIPROFLOXACIN HCL 500 MG PO TABS: 500.0000 mg | ORAL_TABLET | Freq: Two times a day (BID) | ORAL | Status: DC

## 2013-03-20 MED ORDER — CIPROFLOXACIN HCL 500 MG PO TABS
500.0000 mg | ORAL_TABLET | Freq: Once | ORAL | Status: AC
  Administered 2013-03-20: 500 mg via ORAL
  Filled 2013-03-20: qty 1

## 2013-03-20 MED ORDER — PREDNISONE 20 MG PO TABS
60.0000 mg | ORAL_TABLET | Freq: Once | ORAL | Status: AC
  Administered 2013-03-20: 60 mg via ORAL
  Filled 2013-03-20: qty 3

## 2013-03-20 MED ORDER — PREDNISONE 20 MG PO TABS: 40.0000 mg | ORAL_TABLET | Freq: Every day | ORAL | Status: DC

## 2013-03-20 MED ORDER — MORPHINE SULFATE 2 MG/ML IJ SOLN
2.0000 mg | Freq: Once | INTRAMUSCULAR | Status: AC
  Administered 2013-03-20: 2 mg via INTRAMUSCULAR
  Filled 2013-03-20: qty 1

## 2013-03-20 MED ORDER — MORPHINE SULFATE 2 MG/ML IJ SOLN
2.0000 mg | Freq: Once | INTRAMUSCULAR | Status: DC
  Filled 2013-03-20: qty 1

## 2013-03-20 NOTE — ED Notes (Signed)
Sob a few daysfeels weak went to dr last week  For pain in her legs but is no better changed her bp meds she states

## 2013-03-20 NOTE — ED Notes (Signed)
C/o bilateral upper thigh, near groin pain radiating into low back x 5-6 weeks. States pain worse with walking & standing. Saw PCP for same last week, reports MD "drew blood & gave me pain pills". Has a follow up appt next week. Here today in ED due to worsening pain. Reports was going to pain management clinic for chronic low back pain. Denies SOB, no c/o SOB. States "when pain gets really bad it takes my breath away".

## 2013-03-20 NOTE — ED Provider Notes (Signed)
CSN: [id number]     Arrival date & time 03/20/13  1047 History     First MD Initiated Contact with Patient 03/20/13 1109     Chief Complaint  Patient presents with  . Shortness of Breath   (Consider location/radiation/quality/duration/timing/severity/associated sxs/prior Treatment) HPI Comments: Pt with PMH of CAD, HTN, HLD and chronic anemia presents with c/o bilateral leg pain.  She states she has chronic low back pain, but for the last few weeks, she feels as though that pain has gone into her anterior thighs/hips.  She denies any focal neuro deficits or new injuries.  She states she was given pain meds by her PCP but that these have not helped the past two days.  The pain is described as severe, constant but worse with movement.  Nothing else seems to relieve the pain.  She states she has never had pain like this before.    Past Medical History  Diagnosis Date  . CAD (coronary artery disease)   . HTN (hypertension)   . Dyslipidemia   . Anemia   . Cardiomyopathy, ischemic     EF 40% at the time cath   Past Surgical History  Procedure Laterality Date  . Coronary artery bypass graft      LIMA to the LAD, SVG to diagonal, SVG to obtuse marginal, SVG to PDA..  2008   No family history on file. History  Substance Use Topics  . Smoking status: Never Smoker   . Smokeless tobacco: Not on file  . Alcohol Use: Not on file   OB History   Grav Para Term Preterm Abortions TAB SAB Ect Mult Living                 Review of Systems  Constitutional: Negative.   HENT: Negative.   Eyes: Negative.   Respiratory: Negative.   Cardiovascular: Negative.   Gastrointestinal: Negative for nausea, vomiting, diarrhea and abdominal distention.  Endocrine: Negative.   Genitourinary:       Pt reports mild dysuria  Musculoskeletal: Negative.   Skin: Negative.   Allergic/Immunologic: Negative.   Neurological: Negative.   Hematological: Negative.   Psychiatric/Behavioral: Negative.   All  other systems reviewed and are negative.    Allergies  Atorvastatin and Penicillins  Home Medications   Current Outpatient Rx  Name  Route  Sig  Dispense  Refill  . aspirin 81 MG tablet   Oral   Take 81 mg by mouth daily.           . fish oil-omega-3 fatty acids 1000 MG capsule   Oral   Take 2 g by mouth daily.           . furosemide (LASIX) 20 MG tablet   Oral   Take 20 mg by mouth 2 (two) times daily.           Marland Kitchen lisinopril-hydrochlorothiazide (PRINZIDE,ZESTORETIC) 10-12.5 MG per tablet   Oral   Take 1 tablet by mouth daily.           Marland Kitchen LORazepam (ATIVAN) 0.5 MG tablet   Oral   Take 0.5 mg by mouth every 8 (eight) hours.           . metoprolol tartrate (LOPRESSOR) 25 MG tablet   Oral   Take 25 mg by mouth 2 (two) times daily.           Marland Kitchen omeprazole (PRILOSEC) 20 MG capsule   Oral   Take 20 mg by mouth daily.           Marland Kitchen  ONE TOUCH ULTRA TEST test strip               . ONETOUCH DELICA LANCETS MISC               . sertraline (ZOLOFT) 50 MG tablet   Oral   Take 50 mg by mouth daily.            BP 180/82  Pulse 68  Temp(Src) 97.5 F (36.4 C)  Resp 16  SpO2 98% Physical Exam  Nursing note and vitals reviewed.  CONSTITUTIONAL  well developed, well nourished, alert, non toxic appearing; appears in pain HEENT  normocephalic, atraumatic, external ears normal, nose normal, mucus membranes moist, oropharynx clear EYES  normal conjunctiva, no sclera icterus noted, EOM intact, PERRL NECK  normal ROM, supple, no JVD, trachea normal, no mass CARDIOVASCULAR  no signifcant murmur noted, full and effective pulses; 2+ pedal, DP and femoral pulses.  Pt is warm and well-perfused PULMONARY/CHEST WALL  normal effort, no respiratory distress, BS normal, no wheezes, no rhonchi, no rales ABDOMINAL  normal appearance, no distension, no mass, no pulsatile mass; soft, mild suprapubic tenderness, no rigidity, guarding or rebound GU  No cvat;  Genital eval  deferred MUSCULOSKELETAL  Normal range of motion of extremities;  no midline cervical, thoracic, or lumbar spine tenderness, no peripheral edema, no calf tenderness or cords NEUROLOGICAL  patient is awake and responsive;  no significant motor or sensory deficit noted  SKIN  warm, dry, intact, no rash PSYCHIATRIC  No suicidal or homicidal ideations;  Normal affect and expected cognition   ED Course   Procedures (including critical care time)  EKG: Rate 72 normal sinus rhythm. PR interval 138, QRS interval 84, QTc interval 440. Nonspecific ST-T wave changes which are unchanged when compared to EKG from 08/07/2012 Results for orders placed during the hospital encounter of 03/20/13  URINALYSIS, ROUTINE W REFLEX MICROSCOPIC      Result Value Range   Color, Urine YELLOW  YELLOW   APPearance CLEAR  CLEAR   Specific Gravity, Urine 1.011  1.005 - 1.030   pH 7.5  5.0 - 8.0   Glucose, UA NEGATIVE  NEGATIVE mg/dL   Hgb urine dipstick NEGATIVE  NEGATIVE   Bilirubin Urine NEGATIVE  NEGATIVE   Ketones, ur NEGATIVE  NEGATIVE mg/dL   Protein, ur NEGATIVE  NEGATIVE mg/dL   Urobilinogen, UA 0.2  0.0 - 1.0 mg/dL   Nitrite NEGATIVE  NEGATIVE   Leukocytes, UA SMALL (*) NEGATIVE  CBC WITH DIFFERENTIAL      Result Value Range   WBC 6.0  4.0 - 10.5 K/uL   RBC 4.51  3.87 - 5.11 MIL/uL   Hemoglobin 10.7 (*) 12.0 - 15.0 g/dL   HCT 40.9 (*) 81.1 - 91.4 %   MCV 73.6 (*) 78.0 - 100.0 fL   MCH 23.7 (*) 26.0 - 34.0 pg   MCHC 32.2  30.0 - 36.0 g/dL   RDW 78.2 (*) 95.6 - 21.3 %   Platelets 264  150 - 400 K/uL   Neutrophils Relative % 59  43 - 77 %   Neutro Abs 3.5  1.7 - 7.7 K/uL   Lymphocytes Relative 29  12 - 46 %   Lymphs Abs 1.7  0.7 - 4.0 K/uL   Monocytes Relative 11  3 - 12 %   Monocytes Absolute 0.6  0.1 - 1.0 K/uL   Eosinophils Relative 2  0 - 5 %   Eosinophils Absolute 0.1  0.0 - 0.7  K/uL   Basophils Relative 0  0 - 1 %   Basophils Absolute 0.0  0.0 - 0.1 K/uL  COMPREHENSIVE METABOLIC PANEL       Result Value Range   Sodium 133 (*) 135 - 145 mEq/L   Potassium 4.5  3.5 - 5.1 mEq/L   Chloride 98  96 - 112 mEq/L   CO2 24  19 - 32 mEq/L   Glucose, Bld 92  70 - 99 mg/dL   BUN 14  6 - 23 mg/dL   Creatinine, Ser 4.78  0.50 - 1.10 mg/dL   Calcium 9.2  8.4 - 29.5 mg/dL   Total Protein 6.7  6.0 - 8.3 g/dL   Albumin 3.3 (*) 3.5 - 5.2 g/dL   AST 33  0 - 37 U/L   ALT 24  0 - 35 U/L   Alkaline Phosphatase 72  39 - 117 U/L   Total Bilirubin 0.2 (*) 0.3 - 1.2 mg/dL   GFR calc non Af Amer 65 (*) >90 mL/min   GFR calc Af Amer 76 (*) >90 mL/min  URINE MICROSCOPIC-ADD ON      Result Value Range   Squamous Epithelial / LPF RARE  RARE   WBC, UA 3-6  <3 WBC/hpf   Bacteria, UA RARE  RARE   No results found.    MDM  2:03 PM Spoke with pt's PCP regarding pt's presentation.  Patient has no complaint of dyspnea as noted in the triage note. She is complaining of bilateral lower extremity pain. Her the patient's PCP, they have been working this up for several weeks.  Per PCP, the patient was recently on a steroid taper which completely improved the patient's symptoms. She did have some leukocytosis after starting this medication as well as elevated sed rate, so the medication was stopped in order for labs to normalize. The patient's symptoms worsened after that. PCP is concerned that the patient has PMR. I will go ahead and send a sedimentation rate here for the PCP to followup on and restart steroid taper.  She has no midline L-spine tenderness or focal neuro deficits to indicate need for emergent MRI. Pulses are good throughout, and I do not think that this is related to acute vascular event.  Concerning the patient's suprapubic pain without other abd pain, +ROS or findings c/f peritonitis on exam, she does have some urinary frequency as well as leukocytes in her urine. I will start her on short course of Cipro with strict return precautions.  No imaging indicated at this point  Ashby Dawes,  MD 03/21/13 [phone]

## 2013-04-21 ENCOUNTER — Other Ambulatory Visit: Payer: Self-pay | Admitting: Neurosurgery

## 2013-04-24 ENCOUNTER — Encounter (HOSPITAL_COMMUNITY): Payer: Self-pay | Admitting: Pharmacy Technician

## 2013-04-29 ENCOUNTER — Encounter (HOSPITAL_COMMUNITY)
Admission: RE | Admit: 2013-04-29 | Discharge: 2013-04-29 | Disposition: A | Discharge status: Home / Self Care | Payer: Medicare Other | Source: Ambulatory Visit | Attending: Neurosurgery | Admitting: Neurosurgery

## 2013-04-29 ENCOUNTER — Encounter (HOSPITAL_COMMUNITY): Payer: Self-pay

## 2013-04-29 ENCOUNTER — Encounter (HOSPITAL_COMMUNITY)
Admission: RE | Admit: 2013-04-29 | Discharge: 2013-04-29 | Disposition: A | Discharge status: Home / Self Care | Payer: Medicare Other | Source: Ambulatory Visit | Attending: Anesthesiology | Admitting: Anesthesiology

## 2013-04-29 DIAGNOSIS — Z01812 Encounter for preprocedural laboratory examination: Secondary | ICD-10-CM | POA: Insufficient documentation

## 2013-04-29 DIAGNOSIS — Z01818 Encounter for other preprocedural examination: Secondary | ICD-10-CM | POA: Insufficient documentation

## 2013-04-29 HISTORY — DX: Type 2 diabetes mellitus without complications: E11.9

## 2013-04-29 HISTORY — DX: Unspecified osteoarthritis, unspecified site: M19.90

## 2013-04-29 LAB — BASIC METABOLIC PANEL
BUN: 18 mg/dL (ref 6–23)
CO2: 26 mEq/L (ref 19–32)
Calcium: 8.9 mg/dL (ref 8.4–10.5)
Chloride: 98 mEq/L (ref 96–112)
Creatinine, Ser: 0.93 mg/dL (ref 0.50–1.10)
GFR calc Af Amer: 64 mL/min — ABNORMAL LOW (ref >90)
GFR calc non Af Amer: 55 mL/min — ABNORMAL LOW (ref >90)
Glucose, Bld: 108 mg/dL — ABNORMAL HIGH (ref 70–99)
Potassium: 4.1 mEq/L (ref 3.5–5.1)
Sodium: 133 mEq/L — ABNORMAL LOW (ref 135–145)

## 2013-04-29 LAB — SURGICAL PCR SCREEN
MRSA, PCR: NEGATIVE
Staphylococcus aureus: NEGATIVE

## 2013-04-29 LAB — CBC
HCT: 32.9 % — ABNORMAL LOW (ref 36.0–46.0)
Hemoglobin: 10.3 g/dL — ABNORMAL LOW (ref 12.0–15.0)
MCH: 22.3 pg — ABNORMAL LOW (ref 26.0–34.0)
MCHC: 31.3 g/dL (ref 30.0–36.0)
MCV: 71.2 fL — ABNORMAL LOW (ref 78.0–100.0)
Platelets: 357 10*3/uL (ref 150–400)
RBC: 4.62 MIL/uL (ref 3.87–5.11)
RDW: 18.6 % — ABNORMAL HIGH (ref 11.5–15.5)
WBC: 7.7 10*3/uL (ref 4.0–10.5)

## 2013-04-29 NOTE — Progress Notes (Signed)
Note dr hochrein from 12/13 due next visit 12/14, ekg 7/14, echo4/10

## 2013-04-29 NOTE — Pre-Procedure Instructions (Addendum)
CHAQUANA NICHOLS  04/29/2013   Your procedure is scheduled on:  05/01/13  Report to [facility] at 700 AM.  Call this number if you have problems the morning of surgery: [phone]   Remember:   Do not eat food or drink liquids after midnight.   Take these medicines the morning of surgery with A SIP OF WATER: amlodipine,pain med, metoprolol,prilosec          STOP aspirin, fish oil now   Do not wear jewelry, make-up or nail polish.  Do not wear lotions, powders, or perfumes. You may wear deodorant.  Do not shave 48 hours prior to surgery. Men may shave face and neck.  Do not bring valuables to the hospital.  [facility] is not responsible                   for any belongings or valuables.  Contacts, dentures or bridgework may not be worn into surgery.  Leave suitcase in the car. After surgery it may be brought to your room.  For patients admitted to the hospital, checkout time is 11:00 AM the day of  discharge.   Patients discharged the day of surgery will not be allowed to drive  home.  Name and phone number of your driver:  Special Instructions: Shower using CHG 2 nights before surgery and the night before surgery.  If you shower the day of surgery use CHG.  Use special wash - you have one bottle of CHG for all showers.  You should use approximately 1/3 of the bottle for each shower.   Please read over the following fact sheets that you were given: Pain Booklet, Coughing and Deep Breathing and Surgical Site Infection Prevention

## 2013-04-30 MED ORDER — VANCOMYCIN HCL IN DEXTROSE 1-5 GM/200ML-% IV SOLN
1000.0000 mg | INTRAVENOUS | Status: AC
  Administered 2013-05-01: 1000 mg via INTRAVENOUS
  Filled 2013-04-30: qty 200

## 2013-04-30 NOTE — Progress Notes (Signed)
Anesthesia Chart Review:  Patient is a 79 year old female scheduled for L3-4, L4-5 laminectomies on 05/01/13 by Dr. Franky Macho.  Her PAT visit was on 04/29/13.  History includes CAD/NSTEMI s/p PTCA 03/2007 and s/p CABG (LIMA to the LAD, SVG to both branches of diagonal, SVG to obtuse marginal, SVG to PDA) 04/2007, ischemic CM (EF 40% in 03/2007, improved to 60% in 11/2008), HTN, DM2, dyslipidemia, anemia, headaches, arthritis, tonsillectomy, appendectomy, non-smoker.  PCP is listed as Dr. Blane Ohara.  Her cardiologist is Dr. Antoine Poche, last visit on 08/07/12.  She had no new CV symptoms, so he felt no further cardiac studies were indicated at that time.  He did order a carotid duplex due to bruits which was done on 08/13/12 and showed 40-59% bilateral ICA stenosis. One year follow-up was recommended.    EKG on 03/19/13 showed NSR with non-specific ST/T wave abnormality.   Echo on 11/25/08 showed: - Left ventricular ejection fraction was estimated to be 60 %. There was hypokinesis of the septal wall. There was mild focal basal septal hypertrophy. Doppler parameters were consistent with high left ventricular filling pressure. - The aortic valve was mildly calcified. There was trivial aortic valvular regurgitation. - There was mild mitral annular calcification. There was mild mitral valvular regurgitation. - The left atrium was mildly dilated. - The estimated peak right ventricular systolic pressure was mild to moderately increased. - There was mild to moderate tricuspid valvular regurgitation.  Preoperative CXR and labs noted.  Reviewed with anesthesiologist Dr. Randa Evens.  Patient has known CAD but with cardiology follow-up within the past year and no new cardiac testing recommended at that time.  She will be further evaluated by her assigned anesthesiologist on the day of surgery and if no new CV/CHF symptoms then it is anticipated that she can proceed as planned.  Velna Ochs [facility]  Center/Anesthesiology Phone [phone] 04/30/2013 12:53 PM

## 2013-05-01 ENCOUNTER — Inpatient Hospital Stay (HOSPITAL_COMMUNITY)
Admission: RE | Admit: 2013-05-01 | Discharge: 2013-05-07 | DRG: 490 | Disposition: A | Discharge status: Skilled Nursing Facility | Payer: Medicare Other | Source: Ambulatory Visit | Attending: Neurosurgery | Admitting: Neurosurgery

## 2013-05-01 ENCOUNTER — Inpatient Hospital Stay (HOSPITAL_COMMUNITY): Payer: Medicare Other | Admitting: Anesthesiology

## 2013-05-01 ENCOUNTER — Inpatient Hospital Stay (HOSPITAL_COMMUNITY): Payer: Medicare Other

## 2013-05-01 ENCOUNTER — Encounter (HOSPITAL_COMMUNITY): Payer: Self-pay | Admitting: Vascular Surgery

## 2013-05-01 ENCOUNTER — Encounter (HOSPITAL_COMMUNITY): Payer: Self-pay | Admitting: *Deleted

## 2013-05-01 ENCOUNTER — Encounter (HOSPITAL_COMMUNITY)
Admission: RE | Disposition: A | Discharge status: Skilled Nursing Facility | Payer: Self-pay | Source: Ambulatory Visit | Attending: Neurosurgery

## 2013-05-01 DIAGNOSIS — I2510 Atherosclerotic heart disease of native coronary artery without angina pectoris: Secondary | ICD-10-CM | POA: Diagnosis present

## 2013-05-01 DIAGNOSIS — F17200 Nicotine dependence, unspecified, uncomplicated: Secondary | ICD-10-CM | POA: Diagnosis present

## 2013-05-01 DIAGNOSIS — Z880 Allergy status to penicillin: Secondary | ICD-10-CM

## 2013-05-01 DIAGNOSIS — F329 Major depressive disorder, single episode, unspecified: Secondary | ICD-10-CM | POA: Diagnosis present

## 2013-05-01 DIAGNOSIS — D62 Acute posthemorrhagic anemia: Secondary | ICD-10-CM | POA: Diagnosis not present

## 2013-05-01 DIAGNOSIS — Z7982 Long term (current) use of aspirin: Secondary | ICD-10-CM

## 2013-05-01 DIAGNOSIS — F3289 Other specified depressive episodes: Secondary | ICD-10-CM | POA: Diagnosis present

## 2013-05-01 DIAGNOSIS — Z01812 Encounter for preprocedural laboratory examination: Secondary | ICD-10-CM

## 2013-05-01 DIAGNOSIS — I10 Essential (primary) hypertension: Secondary | ICD-10-CM | POA: Diagnosis present

## 2013-05-01 DIAGNOSIS — Z79899 Other long term (current) drug therapy: Secondary | ICD-10-CM

## 2013-05-01 DIAGNOSIS — R7309 Other abnormal glucose: Secondary | ICD-10-CM | POA: Diagnosis present

## 2013-05-01 DIAGNOSIS — I252 Old myocardial infarction: Secondary | ICD-10-CM

## 2013-05-01 DIAGNOSIS — M48062 Spinal stenosis, lumbar region with neurogenic claudication: Principal | ICD-10-CM | POA: Diagnosis present

## 2013-05-01 HISTORY — PX: LUMBAR LAMINECTOMY/DECOMPRESSION MICRODISCECTOMY: SHX5026

## 2013-05-01 LAB — GLUCOSE, CAPILLARY
Glucose-Capillary: 121 mg/dL — ABNORMAL HIGH (ref 70–99)
Glucose-Capillary: 108 mg/dL — ABNORMAL HIGH (ref 70–99)
Glucose-Capillary: 122 mg/dL — ABNORMAL HIGH (ref 70–99)

## 2013-05-01 SURGERY — LUMBAR LAMINECTOMY/DECOMPRESSION MICRODISCECTOMY 2 LEVELS
Anesthesia: General | Wound class: Clean

## 2013-05-01 MED ORDER — ACETAMINOPHEN 325 MG PO TABS
650.0000 mg | ORAL_TABLET | ORAL | Status: DC | PRN
  Administered 2013-05-04 – 2013-05-07 (×2): 650 mg via ORAL
  Filled 2013-05-01 (×2): qty 2

## 2013-05-01 MED ORDER — ROCURONIUM BROMIDE 100 MG/10ML IV SOLN
INTRAVENOUS | Status: DC | PRN
  Administered 2013-05-01: 50 mg via INTRAVENOUS

## 2013-05-01 MED ORDER — THROMBIN 5000 UNITS EX SOLR
CUTANEOUS | Status: DC | PRN
  Administered 2013-05-01 (×2): 5000 [IU] via TOPICAL

## 2013-05-01 MED ORDER — OXYCODONE HCL 5 MG/5ML PO SOLN: 5.0000 mg | Freq: Once | ORAL | Status: DC | PRN

## 2013-05-01 MED ORDER — LIDOCAINE HCL (CARDIAC) 20 MG/ML IV SOLN
INTRAVENOUS | Status: DC | PRN
  Administered 2013-05-01: 50 mg via INTRAVENOUS

## 2013-05-01 MED ORDER — HYDROCODONE-ACETAMINOPHEN 5-325 MG PO TABS
1.0000 | ORAL_TABLET | ORAL | Status: DC | PRN
  Administered 2013-05-02: 2 via ORAL
  Administered 2013-05-03 (×3): 1 via ORAL
  Administered 2013-05-04 – 2013-05-06 (×4): 2 via ORAL
  Administered 2013-05-07: 1 via ORAL
  Administered 2013-05-07: 2 via ORAL
  Filled 2013-05-01: qty 2
  Filled 2013-05-01: qty 1
  Filled 2013-05-01 (×2): qty 2
  Filled 2013-05-01 (×3): qty 1
  Filled 2013-05-01 (×4): qty 2

## 2013-05-01 MED ORDER — METOPROLOL TARTRATE 25 MG PO TABS
25.0000 mg | ORAL_TABLET | Freq: Two times a day (BID) | ORAL | Status: DC
  Administered 2013-05-01 – 2013-05-07 (×10): 25 mg via ORAL
  Filled 2013-05-01 (×13): qty 1

## 2013-05-01 MED ORDER — ASPIRIN EC 81 MG PO TBEC
81.0000 mg | DELAYED_RELEASE_TABLET | Freq: Every day | ORAL | Status: DC
  Administered 2013-05-01 – 2013-05-07 (×7): 81 mg via ORAL
  Filled 2013-05-01 (×7): qty 1

## 2013-05-01 MED ORDER — OXYCODONE-ACETAMINOPHEN 5-325 MG PO TABS
1.0000 | ORAL_TABLET | ORAL | Status: DC | PRN
  Administered 2013-05-01: 2 via ORAL

## 2013-05-01 MED ORDER — SODIUM CHLORIDE 0.9 % IV SOLN
INTRAVENOUS | Status: DC | PRN
  Administered 2013-05-01: 16:00:00 via INTRAVENOUS

## 2013-05-01 MED ORDER — ACETAMINOPHEN 650 MG RE SUPP
650.0000 mg | RECTAL | Status: DC | PRN
  Filled 2013-05-01: qty 1

## 2013-05-01 MED ORDER — PHENOL 1.4 % MT LIQD: 1.0000 | OROMUCOSAL | Status: DC | PRN

## 2013-05-01 MED ORDER — ONDANSETRON HCL 4 MG/2ML IJ SOLN
INTRAMUSCULAR | Status: DC | PRN
  Administered 2013-05-01: 4 mg via INTRAVENOUS

## 2013-05-01 MED ORDER — LORAZEPAM 1 MG PO TABS
1.0000 mg | ORAL_TABLET | Freq: Every day | ORAL | Status: DC
  Administered 2013-05-01 – 2013-05-07 (×7): 1 mg via ORAL
  Filled 2013-05-01 (×7): qty 1

## 2013-05-01 MED ORDER — EPHEDRINE SULFATE 50 MG/ML IJ SOLN
INTRAMUSCULAR | Status: DC | PRN
  Administered 2013-05-01 (×3): 10 mg via INTRAVENOUS

## 2013-05-01 MED ORDER — METOCLOPRAMIDE HCL 5 MG/ML IJ SOLN: 10.0000 mg | Freq: Once | INTRAMUSCULAR | Status: DC | PRN

## 2013-05-01 MED ORDER — LIDOCAINE-EPINEPHRINE 0.5 %-1:200000 IJ SOLN
INTRAMUSCULAR | Status: DC | PRN
  Administered 2013-05-01: 30 mL via INTRADERMAL
  Administered 2013-05-01: 10 mL via INTRADERMAL

## 2013-05-01 MED ORDER — POTASSIUM CHLORIDE IN NACL 20-0.9 MEQ/L-% IV SOLN
INTRAVENOUS | Status: DC
  Administered 2013-05-01: 20:00:00 via INTRAVENOUS
  Filled 2013-05-01 (×13): qty 1000

## 2013-05-01 MED ORDER — 0.9 % SODIUM CHLORIDE (POUR BTL) OPTIME
TOPICAL | Status: DC | PRN
  Administered 2013-05-01: 1000 mL

## 2013-05-01 MED ORDER — ASPIRIN 81 MG PO TABS: 81.0000 mg | ORAL_TABLET | Freq: Every day | ORAL | Status: DC

## 2013-05-01 MED ORDER — PANTOPRAZOLE SODIUM 40 MG PO TBEC
40.0000 mg | DELAYED_RELEASE_TABLET | Freq: Every day | ORAL | Status: DC
  Administered 2013-05-02 – 2013-05-07 (×6): 40 mg via ORAL
  Filled 2013-05-01 (×6): qty 1

## 2013-05-01 MED ORDER — SENNA 8.6 MG PO TABS
1.0000 | ORAL_TABLET | Freq: Two times a day (BID) | ORAL | Status: DC
Start: 2013-05-01 — End: 2013-05-08
  Administered 2013-05-01 – 2013-05-07 (×13): 8.6 mg via ORAL
  Filled 2013-05-01 (×13): qty 1

## 2013-05-01 MED ORDER — SODIUM CHLORIDE 0.9 % IJ SOLN
3.0000 mL | Freq: Two times a day (BID) | INTRAMUSCULAR | Status: DC
  Administered 2013-05-02 – 2013-05-07 (×5): 3 mL via INTRAVENOUS

## 2013-05-01 MED ORDER — POLYETHYLENE GLYCOL 3350 17 G PO PACK
17.0000 g | PACK | Freq: Every day | ORAL | Status: DC | PRN
  Administered 2013-05-02: 17 g via ORAL
  Filled 2013-05-01: qty 1

## 2013-05-01 MED ORDER — LACTATED RINGERS IV SOLN
INTRAVENOUS | Status: DC | PRN
  Administered 2013-05-01: 15:00:00 via INTRAVENOUS

## 2013-05-01 MED ORDER — HEMOSTATIC AGENTS (NO CHARGE) OPTIME
TOPICAL | Status: DC | PRN
  Administered 2013-05-01: 1 via TOPICAL

## 2013-05-01 MED ORDER — FENTANYL CITRATE 0.05 MG/ML IJ SOLN
INTRAMUSCULAR | Status: DC | PRN
  Administered 2013-05-01: 50 ug via INTRAVENOUS

## 2013-05-01 MED ORDER — HYDROMORPHONE HCL PF 1 MG/ML IJ SOLN: 0.2500 mg | INTRAMUSCULAR | Status: DC | PRN

## 2013-05-01 MED ORDER — OXYCODONE HCL 5 MG PO TABS: 5.0000 mg | ORAL_TABLET | Freq: Once | ORAL | Status: DC | PRN

## 2013-05-01 MED ORDER — SODIUM CHLORIDE 0.9 % IV SOLN: 250.0000 mL | INTRAVENOUS | Status: DC

## 2013-05-01 MED ORDER — NEOSTIGMINE METHYLSULFATE 1 MG/ML IJ SOLN
INTRAMUSCULAR | Status: DC | PRN
  Administered 2013-05-01: 4 mg via INTRAVENOUS

## 2013-05-01 MED ORDER — SERTRALINE HCL 100 MG PO TABS
100.0000 mg | ORAL_TABLET | Freq: Every day | ORAL | Status: DC
  Administered 2013-05-01 – 2013-05-06 (×6): 100 mg via ORAL
  Filled 2013-05-01 (×7): qty 1

## 2013-05-01 MED ORDER — OXYCODONE-ACETAMINOPHEN 5-325 MG PO TABS
ORAL_TABLET | ORAL | Status: AC
  Filled 2013-05-01: qty 2

## 2013-05-01 MED ORDER — LACTATED RINGERS IV SOLN
INTRAVENOUS | Status: DC
  Administered 2013-05-01: 08:00:00 via INTRAVENOUS

## 2013-05-01 MED ORDER — CYCLOBENZAPRINE HCL 10 MG PO TABS
10.0000 mg | ORAL_TABLET | Freq: Three times a day (TID) | ORAL | Status: DC | PRN
  Administered 2013-05-04 – 2013-05-07 (×2): 10 mg via ORAL
  Filled 2013-05-01 (×4): qty 1

## 2013-05-01 MED ORDER — MIDAZOLAM HCL 5 MG/5ML IJ SOLN
INTRAMUSCULAR | Status: DC | PRN
  Administered 2013-05-01: 1 mg via INTRAVENOUS

## 2013-05-01 MED ORDER — HYDROMORPHONE HCL PF 1 MG/ML IJ SOLN
0.5000 mg | INTRAMUSCULAR | Status: DC | PRN
  Administered 2013-05-01: 1 mg via INTRAVENOUS
  Filled 2013-05-01: qty 1

## 2013-05-01 MED ORDER — GLYCOPYRROLATE 0.2 MG/ML IJ SOLN
INTRAMUSCULAR | Status: DC | PRN
  Administered 2013-05-01: .6 mg via INTRAVENOUS

## 2013-05-01 MED ORDER — FUROSEMIDE 20 MG PO TABS
20.0000 mg | ORAL_TABLET | Freq: Two times a day (BID) | ORAL | Status: DC | PRN
  Filled 2013-05-01: qty 1

## 2013-05-01 MED ORDER — MENTHOL 3 MG MT LOZG
1.0000 | LOZENGE | OROMUCOSAL | Status: DC | PRN
  Administered 2013-05-01: 3 mg via ORAL
  Filled 2013-05-01: qty 9

## 2013-05-01 MED ORDER — SODIUM CHLORIDE 0.9 % IJ SOLN: 3.0000 mL | INTRAMUSCULAR | Status: DC | PRN

## 2013-05-01 MED ORDER — AMLODIPINE BESYLATE 5 MG PO TABS
5.0000 mg | ORAL_TABLET | Freq: Every day | ORAL | Status: DC
  Administered 2013-05-01 – 2013-05-07 (×5): 5 mg via ORAL
  Filled 2013-05-01 (×7): qty 1

## 2013-05-01 MED ORDER — PROPOFOL 10 MG/ML IV BOLUS
INTRAVENOUS | Status: DC | PRN
  Administered 2013-05-01: 150 mg via INTRAVENOUS

## 2013-05-01 MED ORDER — ONDANSETRON HCL 4 MG/2ML IJ SOLN: 4.0000 mg | INTRAMUSCULAR | Status: DC | PRN

## 2013-05-01 MED ORDER — KETOROLAC TROMETHAMINE 30 MG/ML IJ SOLN
15.0000 mg | Freq: Four times a day (QID) | INTRAMUSCULAR | Status: DC
  Administered 2013-05-01 – 2013-05-04 (×12): 15 mg via INTRAVENOUS
  Filled 2013-05-01 (×19): qty 1

## 2013-05-01 MED ORDER — KETOROLAC TROMETHAMINE 30 MG/ML IJ SOLN
INTRAMUSCULAR | Status: AC
  Filled 2013-05-01: qty 1

## 2013-05-01 SURGICAL SUPPLY — 53 items
ADH SKN CLS APL DERMABOND .7 (GAUZE/BANDAGES/DRESSINGS) ×1
APL SKNCLS STERI-STRIP NONHPOA (GAUZE/BANDAGES/DRESSINGS)
BAG DECANTER FOR FLEXI CONT (MISCELLANEOUS) ×2 IMPLANT
BENZOIN TINCTURE PRP APPL 2/3 (GAUZE/BANDAGES/DRESSINGS) IMPLANT
BLADE SURG ROTATE 9660 (MISCELLANEOUS) IMPLANT
BUR MATCHSTICK NEURO 3.0 LAGG (BURR) ×2 IMPLANT
CANISTER SUCTION 2500CC (MISCELLANEOUS) ×2 IMPLANT
CLOTH BEACON ORANGE TIMEOUT ST (SAFETY) ×2 IMPLANT
CONT SPEC 4OZ CLIKSEAL STRL BL (MISCELLANEOUS) ×2 IMPLANT
DECANTER SPIKE VIAL GLASS SM (MISCELLANEOUS) ×2 IMPLANT
DERMABOND ADVANCED (GAUZE/BANDAGES/DRESSINGS) ×1
DERMABOND ADVANCED .7 DNX12 (GAUZE/BANDAGES/DRESSINGS) ×1 IMPLANT
DRAPE LAPAROTOMY 100X72X124 (DRAPES) ×2 IMPLANT
DRAPE MICROSCOPE LEICA (MISCELLANEOUS) ×2 IMPLANT
DRAPE POUCH INSTRU U-SHP 10X18 (DRAPES) ×2 IMPLANT
DRAPE SURG 17X23 STRL (DRAPES) ×2 IMPLANT
DRSG OPSITE POSTOP 4X8 (GAUZE/BANDAGES/DRESSINGS) ×2 IMPLANT
DURAPREP 26ML APPLICATOR (WOUND CARE) ×2 IMPLANT
ELECT REM PT RETURN 9FT ADLT (ELECTROSURGICAL) ×2
ELECTRODE REM PT RTRN 9FT ADLT (ELECTROSURGICAL) ×1 IMPLANT
GAUZE SPONGE 4X4 16PLY XRAY LF (GAUZE/BANDAGES/DRESSINGS) IMPLANT
GLOVE BIOGEL PI IND STRL 8 (GLOVE) IMPLANT
GLOVE BIOGEL PI INDICATOR 8 (GLOVE) ×1
GLOVE ECLIPSE 6.5 STRL STRAW (GLOVE) ×2 IMPLANT
GLOVE ECLIPSE 7.5 STRL STRAW (GLOVE) ×4 IMPLANT
GLOVE EXAM NITRILE LRG STRL (GLOVE) IMPLANT
GLOVE EXAM NITRILE MD LF STRL (GLOVE) ×2 IMPLANT
GLOVE EXAM NITRILE XL STR (GLOVE) IMPLANT
GLOVE EXAM NITRILE XS STR PU (GLOVE) IMPLANT
GOWN BRE IMP SLV AUR LG STRL (GOWN DISPOSABLE) ×2 IMPLANT
GOWN BRE IMP SLV AUR XL STRL (GOWN DISPOSABLE) ×2 IMPLANT
GOWN STRL REIN 2XL LVL4 (GOWN DISPOSABLE) IMPLANT
KIT BASIN OR (CUSTOM PROCEDURE TRAY) ×2 IMPLANT
KIT ROOM TURNOVER OR (KITS) ×2 IMPLANT
NDL HYPO 25X1 1.5 SAFETY (NEEDLE) ×1 IMPLANT
NEEDLE HYPO 25X1 1.5 SAFETY (NEEDLE) ×2 IMPLANT
NEEDLE SPNL 18GX3.5 QUINCKE PK (NEEDLE) ×2 IMPLANT
NS IRRIG 1000ML POUR BTL (IV SOLUTION) ×2 IMPLANT
PACK LAMINECTOMY NEURO (CUSTOM PROCEDURE TRAY) ×2 IMPLANT
PAD ARMBOARD 7.5X6 YLW CONV (MISCELLANEOUS) ×6 IMPLANT
RUBBERBAND STERILE (MISCELLANEOUS) ×4 IMPLANT
SPONGE GAUZE 4X4 12PLY (GAUZE/BANDAGES/DRESSINGS) IMPLANT
SPONGE LAP 4X18 X RAY DECT (DISPOSABLE) IMPLANT
SPONGE SURGIFOAM ABS GEL SZ50 (HEMOSTASIS) ×2 IMPLANT
STRIP CLOSURE SKIN 1/2X4 (GAUZE/BANDAGES/DRESSINGS) IMPLANT
SUT VIC AB 0 CT1 18XCR BRD8 (SUTURE) ×2 IMPLANT
SUT VIC AB 0 CT1 8-18 (SUTURE) ×4
SUT VIC AB 2-0 CT1 18 (SUTURE) ×2 IMPLANT
SUT VIC AB 3-0 SH 8-18 (SUTURE) ×2 IMPLANT
SYR 20ML ECCENTRIC (SYRINGE) ×2 IMPLANT
TOWEL OR 17X24 6PK STRL BLUE (TOWEL DISPOSABLE) ×2 IMPLANT
TOWEL OR 17X26 10 PK STRL BLUE (TOWEL DISPOSABLE) ×2 IMPLANT
WATER STERILE IRR 1000ML POUR (IV SOLUTION) ×2 IMPLANT

## 2013-05-01 NOTE — Anesthesia Postprocedure Evaluation (Signed)
  Anesthesia Post-op Note  Patient: [name]  Procedure(s) Performed: Procedure(s) with comments: LUMBAR LAMINECTOMY/DECOMPRESSION MICRODISCECTOMY 2 LEVELS (N/A) - Lumbar Three-Four, Four-Five Laminectomies   Patient Location: PACU  Anesthesia Type:General  Level of Consciousness: awake  Airway and Oxygen Therapy: Patient Spontanous Breathing  Post-op Pain: mild  Post-op Assessment: Post-op Vital signs reviewed  Post-op Vital Signs: stable  Complications: No apparent anesthesia complications

## 2013-05-01 NOTE — H&P (Signed)
BP 177/67  Pulse 51  Temp(Src) 97.2 F (36.2 C) (Oral)  Resp 18  SpO2 97%     [name] is an 79 year old woman who presents today with a year long history of severe pain in the back and lower extremities.  The pain is worse whenever she stands or walks.  She has had at least six epidural injections, she has had a back brace, neither of which has been of any great use to her.  She had an MRI most recently in July and she had had an MRI last July, both of those are showing the same thing.  What it shows is severe stenosis at L3-4 and L4-5 and degenerative changes present throughout the lumbar spine, but worse at L3-4, L4-5, and L5-S1.   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   REVIEW OF SYSTEMS:                                    Positive for coronary artery disease, back pain, lower extremity pain, easy bruising, and depression.  She denies respiratory, reproductive, skin, genitourinary, gastrointestinal, eye, ear, nose, throat, and mouth, allergic problems or any constitutional problems.   PAST MEDICAL HISTORY:            Current Medical Conditions:  Significant for hypertension and in her own words borderline diabetes.  She has had one myocardial infarction.            Prior Operations:  She has undergone a coronary artery vessel bypass, five vessels.            Medications and Allergies:  SHE IS ALLERGIC TO PENICILLIN,  WHICH CAUSES A RASH.  SHE IS INTOLERANT TO STATINS AND THEY CAUSE HER TO ACHE.  She takes amlodipine, Lasix, metoprolol, omeprazole, sertraline, and an 81 mg aspirin tablet.   FAMILY HISTORY:                                            Not provided.   SOCIAL HISTORY:                                            She does smoke.  She does not use alcohol.  She does not use illicit drugs.   PHYSICAL EXAMINATION:                                She is 63.78 inches in height, she weighs 158 pounds, BMI  is 27.31, blood pressure is 133/70, and pulse is 56.  On examination, she is alert and oriented x4 and answering all questions appropriately.  On her pain chart, she draws pain in the right upper extremity and both lower extremities and across the lower back and in the posterior left shoulder.  She states she has had some diffuse swelling also, I did not notice that on exam today.  5/5 strength in both upper and lower extremities.  She can toe walk and heel walk though with some difficulty.  Proprioception intact in the upper and lower  extremities.  No clonus.  Toes are downgoing to plantar stimulation.  She has 2+ reflexes at the knees and ankles.  She has a markedly antalgic gait and does need assistance from what I believe is her daughter when she walks.  Gait is stable.  Romberg test was negative.  Pupils are equal, round, and reactive to light.  Full extraocular movements.  Full visual fields.  Hearing intact to finger rub bilaterally, though it is slightly diminished on the right compared to the left.  Uvula elevates in the midline.  Shoulder shrug is normal.  Tongue protrudes in the midline.  Pulses are good at the wrists bilaterally.   IMAGING STUDIES:                                          MRI of the lumbar spine was reviewed from 03/25/2013 and 03/03/2012, I believe.  They both show essentially the same thing, severe and profound stenosis at L3-4, contribution from the disc, ligament, and bone and also at L4-5 where she has an eccentric bulge on the left side.  She has some osteophytic ridging present at L5-S1 on the right.  The radiologist makes a mention of a disc, it is not a disc, I think it is calcification of the disc space.  Conus is normal.  Cauda equina is normal.  Paraspinous soft tissues are normal.   IMPRESSION/PLAN:                                         Ms. Patchen is a woman with classic evidence of neurogenic claudication.  The pain is worse whenever she stands or walks.  It is made  better if she bends over and uses a cart.  It is relieved when she sits or lies down.  She has stenosis, which is easily identified.  I proposed a lumbar laminectomy for decompression.  If there is evidence of instability, she may need a possible fusion.  I would not do interbody, as I do not think that that is contributing in anyway to the real problem of the stenosis.  She has only minimal listhesis of L4 on 5, so I will pay careful attention to this level when it is decompressed and again if there is any evidence of instability, I will just go ahead and fuse it there and she understands that.  Risks and benefits, bleeding, infection, no relief of pain, bowel or bladder dysfunction, nerve damage, weakness all were explained to her.  She understands and wishes to proceed.

## 2013-05-01 NOTE — Preoperative (Signed)
Beta Blockers   Reason not to administer Beta Blockers:Not Applicable; took metoprolol this morning per patient

## 2013-05-01 NOTE — Progress Notes (Signed)
Patient arrived to room via stretcher accompanied by Loraine Leriche, (PACU RN). She is a/o, denied minimal pain at this time. Safety precautions reviewed with patient. Call light within reach, bed on low positioned, bed alarm activated. Family will notify of patient's room per [facility]. VS stabled, dressing intact, no drainage at this time. Will continue to monitor.   Sim Boast, RN 05/01/13 1845

## 2013-05-01 NOTE — Anesthesia Preprocedure Evaluation (Signed)
Anesthesia Evaluation  Patient identified by MRN, date of birth, ID band Patient awake    Reviewed: Allergy & Precautions, H&P , NPO status , Patient's Chart, lab work & pertinent test results, reviewed documented beta blocker date and time   Airway Mallampati: II TM Distance: >3 FB Neck ROM: full    Dental   Pulmonary shortness of breath and with exertion,  breath sounds clear to auscultation        Cardiovascular hypertension, + CAD, + Cardiac Stents and + CABG Rhythm:regular     Neuro/Psych  Headaches, negative neurological ROS  negative psych ROS   GI/Hepatic negative GI ROS, Neg liver ROS,   Endo/Other  diabetes, Oral Hypoglycemic Agents  Renal/GU negative Renal ROS  negative genitourinary   Musculoskeletal   Abdominal   Peds  Hematology  (+) anemia ,   Anesthesia Other Findings See surgeon's H&P   Reproductive/Obstetrics negative OB ROS                           Anesthesia Physical Anesthesia Plan  ASA: III  Anesthesia Plan: General   Post-op Pain Management:    Induction: Intravenous  Airway Management Planned: Oral ETT  Additional Equipment:   Intra-op Plan:   Post-operative Plan: Extubation in OR  Informed Consent: I have reviewed the patients History and Physical, chart, labs and discussed the procedure including the risks, benefits and alternatives for the proposed anesthesia with the patient or authorized representative who has indicated his/her understanding and acceptance.   Dental Advisory Given  Plan Discussed with: CRNA and Surgeon  Anesthesia Plan Comments:         Anesthesia Quick Evaluation

## 2013-05-01 NOTE — Op Note (Signed)
05/01/2013  5:51 PM  PATIENT:  [name]  79 y.o. female  PRE-OPERATIVE DIAGNOSIS:  lumbar stenosis L3/4,4/5  POST-OPERATIVE DIAGNOSIS:  lumbar stenosis L3/4, 4/5  PROCEDURE:  Procedure(s): LUMBAR LAMINECTOMY/DECOMPRESSION MICRODISCECTOMY 2 LEVELS L4, and L3 laminectomies for decompression of the L3, L4, and L5 roots.  SURGEON:  Surgeon(s): Carmela Hurt, MD  ASSISTANTS:none  ANESTHESIA:   general  EBL:  Total I/O In: 1200 [I.V.:1200] Out: -   BLOOD ADMINISTERED:none  CELL SAVER GIVEN:none  COUNT:per nursing  DRAINS: none   SPECIMEN:  No Specimen  DICTATION: [name] was brought to the operating room, intubated and placed under a general anesthetic without difficulty. She was positioned prone on a Wilson frame with all pressure points properly padded. Her back was prepped and draped in a sterile manner. I infiltrated 10cc lidocaine into the planned incision. I opened the skin with a 10 blade and took the incision down to the thoracolumbar fascia. I exposed the lamina of L3, and L4, L2, and L5 bilaterally. I used a 10 blade to score the thoracolumbar fascia, then the Nutcracker to start the laminectomies of L3, and L4. I also used the drill and Kerrison punches to complete the laminectomies and the decompression of the L3, L4, and L5 roots bilaterally. I inspected the neural foramina and was satisfied with the decompression of the aforementioned roots. I irrigated the wound then closed in layered fashion. I approximated the thoracolumbar fascia, subcutaneous, and subcuticular planes. I used dermabond for a Sterile dressing.   PLAN OF CARE: Admit to inpatient   PATIENT DISPOSITION:  PACU - hemodynamically stable.   Delay start of Pharmacological VTE agent (>24hrs) due to surgical blood loss or risk of bleeding:  yes

## 2013-05-01 NOTE — Transfer of Care (Signed)
Immediate Anesthesia Transfer of Care Note  Patient: [name]  Procedure(s) Performed: Procedure(s) with comments: LUMBAR LAMINECTOMY/DECOMPRESSION MICRODISCECTOMY 2 LEVELS (N/A) - Lumbar Three-Four, Four-Five Laminectomies   Patient Location: PACU  Anesthesia Type: General  Level of Consciousness: sedated  Airway & Oxygen Therapy: Patient Spontanous Breathing and Patient connected to nasal cannula oxygen  Post-op Assessment: Report given to PACU RN and Post -op Vital signs reviewed and stable  Post vital signs: Reviewed and stable  Complications: No apparent anesthesia complications

## 2013-05-01 NOTE — Anesthesia Postprocedure Evaluation (Signed)
  Anesthesia Post-op Note  Patient: [name]  Procedure(s) Performed: Procedure(s) with comments: LUMBAR LAMINECTOMY/DECOMPRESSION MICRODISCECTOMY 2 LEVELS (N/A) - Lumbar Three-Four, Four-Five Laminectomies   Patient Location: PACU  Anesthesia Type:General  Level of Consciousness: awake, alert  and oriented  Airway and Oxygen Therapy: Patient Spontanous Breathing and Patient connected to nasal cannula oxygen  Post-op Pain: mild  Post-op Assessment: Post-op Vital signs reviewed, Patient's Cardiovascular Status Stable and Pain level controlled  Post-op Vital Signs: stable  Complications: No apparent anesthesia complications

## 2013-05-02 LAB — GLUCOSE, CAPILLARY
Glucose-Capillary: 111 mg/dL — ABNORMAL HIGH (ref 70–99)
Glucose-Capillary: 132 mg/dL — ABNORMAL HIGH (ref 70–99)
Glucose-Capillary: 119 mg/dL — ABNORMAL HIGH (ref 70–99)
Glucose-Capillary: 122 mg/dL — ABNORMAL HIGH (ref 70–99)

## 2013-05-02 NOTE — Progress Notes (Signed)
Patient ID: [name], female   DOB: 1927-06-15, 79 y.o.   MRN: [id number] Patient seems to be doing fairly well posterior day 1 from a decompressive laminectomy. Significant improvement preoperative radicular symptoms. Still with a lot of back pain. In slow to mobilize. She is not angulated the halls. Patient will need an additional day to increase her strength and mobility possible discharge tomorrow. Her neurologic exam is intact strength 5 out of 5 wound clean and dry.

## 2013-05-02 NOTE — Evaluation (Signed)
Occupational Therapy Evaluation Patient Details Name: [name] MRN: [id number] DOB: [birth year] Today's Date: 05/02/2013 Time: 2440-1027 OT Time Calculation (min): 27 min  OT Assessment / Plan / Recommendation History of present illness s/p L3-4. L4-5 lumbar lami/decompression microdiscectomy   Clinical Impression   Pt admitted with above. Will continue to follow pt acutely in order to address below problem list. Recommending 24/7 supervision/assist at home.    OT Assessment  Patient needs continued OT Services    Follow Up Recommendations  No OT follow up;Supervision/Assistance - 24 hour    Barriers to Discharge      Equipment Recommendations  3 in 1 bedside comode    Recommendations for Other Services    Frequency  Min 2X/week    Precautions / Restrictions Precautions Precautions: Back Precaution Comments: Educated pt on 3/3 back precautions.   Pertinent Vitals/Pain See vitals    ADL  Eating/Feeding: Performed;Independent Where Assessed - Eating/Feeding: Chair Grooming: Performed;Wash/dry hands;Min guard Where Assessed - Grooming: Unsupported standing Upper Body Bathing: Simulated;Set up Where Assessed - Upper Body Bathing: Unsupported sitting Lower Body Bathing: Simulated;Minimal assistance Where Assessed - Lower Body Bathing: Unsupported sit to stand Upper Body Dressing: Performed;Set up Where Assessed - Upper Body Dressing: Unsupported sitting Lower Body Dressing: Simulated;Minimal assistance Where Assessed - Lower Body Dressing: Unsupported sit to stand Toilet Transfer: Performed;Minimal assistance Toilet Transfer Method: Sit to stand Toilet Transfer Equipment: Comfort height toilet;Grab bars Toileting - Clothing Manipulation and Hygiene: Performed;Min guard Where Assessed - Engineer, mining and Hygiene: Sit to stand from 3-in-1 or toilet Equipment Used: Gait belt;Rolling walker Transfers/Ambulation Related to ADLs: Min assist with RW.  Assist and verbl cues for correct use of RW. ADL Comments: Incr time for all tasks but overall doing well.    OT Diagnosis: Generalized weakness;Acute pain  OT Problem List: Decreased strength;Decreased activity tolerance;Impaired balance (sitting and/or standing);Decreased knowledge of use of DME or AE;Decreased knowledge of precautions;Pain OT Treatment Interventions: Self-care/ADL training;DME and/or AE instruction;Therapeutic activities;Patient/family education;Balance training   OT Goals(Current goals can be found in the care plan section) Acute Rehab OT Goals Patient Stated Goal: to return to being active OT Goal Formulation: With patient Time For Goal Achievement: 05/09/13 Potential to Achieve Goals: Good  Visit Information  Last OT Received On: 05/02/13 Assistance Needed: +1 History of Present Illness: s/p L3-4. L4-5 lumbar lami/decompression microdiscectomy       Prior Functioning     Home Living Family/patient expects to be discharged to:: Private residence Living Arrangements: Spouse/significant other Available Help at Discharge: Available 24 hours/day;Family Type of Home: House Home Access: Stairs to enter Secretary/administrator of Steps: 3 Entrance Stairs-Rails: Left Home Layout: One level Home Equipment: Cane - single point Prior Function Level of Independence: Independent Communication Communication: No difficulties Dominant Hand: Right         Vision/Perception Vision - History Baseline Vision: Wears glasses only for reading   Cognition  Cognition Arousal/Alertness: Awake/alert Behavior During Therapy: WFL for tasks assessed/performed Overall Cognitive Status: Within Functional Limits for tasks assessed    Extremity/Trunk Assessment Upper Extremity Assessment Upper Extremity Assessment: Overall WFL for tasks assessed     Mobility Bed Mobility Bed Mobility: Rolling Left;Left Sidelying to Sit;Sitting - Scoot to [place] of Bed Rolling Left: 4: Min  assist;With rail Left Sidelying to Sit: 4: Min assist Sitting - Scoot to [place] of Bed: 4: Min guard Details for Bed Mobility Assistance: Assist to support trunk OOB and to maintain back precautions using log roll technique. Transfers  Transfers: Stand to Sit;Sit to Stand Sit to Stand: 4: Min guard;From bed;From toilet;With upper extremity assist Stand to Sit: 4: Min guard;To chair/3-in-1;To toilet;With armrests;With upper extremity assist Details for Transfer Assistance: VCs for safe hand placement.     Exercise     Balance     End of Session OT - End of Session Equipment Utilized During Treatment: Gait belt;Rolling walker Activity Tolerance: Patient tolerated treatment well Patient left: in chair;with call bell/phone within reach;with family/visitor present Nurse Communication: Mobility status  GO    05/02/2013 Cipriano Mile OTR/L Pager [phone] Office [phone]  Cipriano Mile 05/02/2013, 2:17 PM

## 2013-05-02 NOTE — Evaluation (Signed)
Physical Therapy Evaluation Patient Details Name: [name] MRN: [id number] DOB: 09/18/1926 Today's Date: 05/02/2013 Time: 4098-1191 PT Time Calculation (min): 27 min  PT Assessment / Plan / Recommendation History of Present Illness  s/p L3-4. L4-5 lumbar lami/decompression microdiscectomy  Clinical Impression  Patient is s/p surgery resulting in the deficits listed below (see PT Problem List). Patient will benefit from skilled PT to increase their independence and safety with mobility (while adhering to their precautions) to allow discharge home with support from family. Reviewed back precautions with patient and family as well as safety with bed mobility.      PT Assessment  Patient needs continued PT services    Follow Up Recommendations  Home health PT;Supervision for mobility/OOB    Does the patient have the potential to tolerate intense rehabilitation      Barriers to Discharge        Equipment Recommendations  Rolling walker with 5" wheels    Recommendations for Other Services     Frequency Min 5X/week    Precautions / Restrictions Precautions Precautions: Fall Precaution Comments: Educated pt on 3/3 back precautions. Restrictions Weight Bearing Restrictions: No   Pertinent Vitals/Pain Denies pain     Mobility  Bed Mobility Bed Mobility: Rolling Left;Left Sidelying to Sit;Sitting - Scoot to [place] of Bed Rolling Left: 4: Min assist;With rail Left Sidelying to Sit: 4: Min assist Sitting - Scoot to [place] of Bed: 4: Min guard Details for Bed Mobility Assistance: Assist to support trunk OOB and to maintain back precautions using log roll technique. Transfers Sit to Stand: 4: Min guard;From bed;From toilet;With upper extremity assist Stand to Sit: 4: Min guard;To chair/3-in-1;To toilet;With armrests;With upper extremity assist Details for Transfer Assistance: VCs for safe hand placement. Ambulation/Gait Ambulation/Gait Assistance: 4: Min guard Ambulation  Distance (Feet): 100 Feet Assistive device: Rolling walker Ambulation/Gait Assistance Details: cues for tall posture and safe use of RW Gait Pattern: Step-through pattern Gait velocity: decreased    Exercises     PT Diagnosis: Difficulty walking;Generalized weakness;Acute pain  PT Problem List: Decreased strength;Decreased activity tolerance;Decreased mobility;Decreased knowledge of precautions;Decreased knowledge of use of DME;Decreased balance PT Treatment Interventions: DME instruction;Gait training;Functional mobility training;Therapeutic activities;Therapeutic exercise;Stair training;Balance training;Patient/family education     PT Goals(Current goals can be found in the care plan section) Acute Rehab PT Goals Patient Stated Goal: to return to being active PT Goal Formulation: With patient Time For Goal Achievement: 05/09/13 Potential to Achieve Goals: Good  Visit Information  Last PT Received On: 05/02/13 Assistance Needed: +1 History of Present Illness: s/p L3-4. L4-5 lumbar lami/decompression microdiscectomy       Prior Functioning  Home Living Family/patient expects to be discharged to:: Private residence Living Arrangements: Spouse/significant other Available Help at Discharge: Available 24 hours/day;Family Type of Home: House Home Access: Stairs to enter Secretary/administrator of Steps: 3 Entrance Stairs-Rails: Left Home Layout: One level Home Equipment: Cane - single point Prior Function Level of Independence: Independent Communication Communication: No difficulties Dominant Hand: Right    Cognition  Cognition Arousal/Alertness: Awake/alert Behavior During Therapy: WFL for tasks assessed/performed Overall Cognitive Status: Within Functional Limits for tasks assessed    Extremity/Trunk Assessment Upper Extremity Assessment Upper Extremity Assessment: Overall WFL for tasks assessed Lower Extremity Assessment Lower Extremity Assessment: Generalized  weakness   Balance  Slight LOB when hands off the RW in static standing needing minA to correct  End of Session PT - End of Session Equipment Utilized During Treatment: Gait belt Activity Tolerance: Patient tolerated  treatment well Patient left: in chair;with call bell/phone within reach Nurse Communication: Mobility status;Other (comment) (swollen and painful IV site)  GP     Derren Suydam H 05/02/2013, 2:28 PM

## 2013-05-03 LAB — GLUCOSE, CAPILLARY
Glucose-Capillary: 110 mg/dL — ABNORMAL HIGH (ref 70–99)
Glucose-Capillary: 115 mg/dL — ABNORMAL HIGH (ref 70–99)
Glucose-Capillary: 113 mg/dL — ABNORMAL HIGH (ref 70–99)
Glucose-Capillary: 106 mg/dL — ABNORMAL HIGH (ref 70–99)

## 2013-05-03 NOTE — Progress Notes (Signed)
Patient ID: [name], female   DOB: 06-29-1927, 79 y.o.   MRN: [id number] Subjective:  The patient is alert and pleasant. She looks well. She complains of an annoying cough.  Objective: Vital signs in last 24 hours: Temp:  [97.9 F (36.6 C)-99.2 F (37.3 C)] 98.4 F (36.9 C) (09/07 1012) Pulse Rate:  [57-63] 63 (09/07 1012) Resp:  [17-20] 17 (09/07 1012) BP: (101-137)/(38-65) 117/65 mmHg (09/07 1012) SpO2:  [91 %-98 %] 96 % (09/07 1012)  Intake/Output from previous day: 09/06 0701 - 09/07 0700 In: 1550 [P.O.:1230; I.V.:320] Out: 300 [Urine:300] Intake/Output this shift: Total I/O In: 240 [P.O.:240] Out: -   Physical exam the patient is alert and oriented. She is moving her lower extremities well.  Lab Results: No results found for this basename: WBC, HGB, HCT, PLT,  in the last 72 hours BMET No results found for this basename: NA, K, CL, CO2, GLUCOSE, BUN, CREATININE, CALCIUM,  in the last 72 hours  Studies/Results: Dg Lumbar Spine 2-3 Views  05/01/2013   *RADIOLOGY REPORT*  Clinical Data: L4-L5 laminectomy.  LUMBAR SPINE - 2-3 VIEW  Comparison: MRI 03/25/2013.  Findings: Two intraoperative images.  The first, labeled 1616 hours, demonstrates a surgical device projecting posterior to the L4-L5 level.  The second image, labeled 1621 hours, demonstrates a surgical device projecting posterior to the L3-L4 junction.  IMPRESSION: Intraoperative localization of L3-L4 and L4-L5.   Original Report Authenticated By: Jeronimo Greaves, M.D.    Assessment/Plan: Postop day #2: The patient does not feel she is ready to go home yet. We will continue to mobilize her. She may be able to go home tomorrow.  LOS: 2 days     Piper Hassebrock D 05/03/2013, 12:37 PM

## 2013-05-03 NOTE — Progress Notes (Signed)
Physical Therapy Treatment Patient Details Name: [name] MRN: [id number] DOB: Mar 21, 1927 Today's Date: 05/03/2013 Time: 5621-3086 PT Time Calculation (min): 24 min  PT Assessment / Plan / Recommendation  History of Present Illness s/p L3-4. L4-5 lumbar lami/decompression microdiscectomy   PT Comments   Patient making progress with mobility/gait.  Continues to need cues for back precautions and safety.  Patient declined sitting in chair this am.  Encouraged patient to ambulate with nursing and get OOB to chair this pm.  Follow Up Recommendations  Home health PT;Supervision for mobility/OOB     Does the patient have the potential to tolerate intense rehabilitation     Barriers to Discharge        Equipment Recommendations  Rolling walker with 5" wheels    Recommendations for Other Services    Frequency Min 5X/week   Progress towards PT Goals Progress towards PT goals: Progressing toward goals  Plan Current plan remains appropriate    Precautions / Restrictions Precautions Precautions: Back;Fall Precaution Comments: Reviewed back precautions with patient.  Could recall 1/3. Restrictions Weight Bearing Restrictions: No   Pertinent Vitals/Pain     Mobility  Bed Mobility Bed Mobility: Rolling Right;Right Sidelying to Sit;Sitting - Scoot to [place] of Bed;Sit to Sidelying Right Rolling Right: 4: Min guard Right Sidelying to Sit: 4: Min guard;HOB flat Sitting - Scoot to Edge of Bed: 4: Min guard Sit to Sidelying Right: 4: Min assist;HOB flat Details for Bed Mobility Assistance: Removed rail and put HOB flat.  Spent time reviewing precautions, log rolling, and sequencing for moving side <> sit.  Patient required increased time, and repeated verbal cues through each step of sequence. Transfers Transfers: Sit to Stand;Stand to Sit Sit to Stand: 4: Min guard;With upper extremity assist;From bed Stand to Sit: 4: Min guard;With upper extremity assist;To bed Details for Transfer  Assistance: Patient needed reminders for safe hand placement during transfers.  Cues to keep back straight when moving to standing. Ambulation/Gait Ambulation/Gait Assistance: 4: Min guard Ambulation Distance (Feet): 200 Feet Assistive device: Rolling walker Ambulation/Gait Assistance Details: Cues to stand upright and stay close to RW during gait. Gait Pattern: Step-through pattern;Decreased stride length Gait velocity: Slow gait Stairs: No (Patient declined today.)      PT Goals (current goals can now be found in the care plan section)    Visit Information  Last PT Received On: 05/03/13 Assistance Needed: +1 History of Present Illness: s/p L3-4. L4-5 lumbar lami/decompression microdiscectomy    Subjective Data  Subjective: My mouth is so dry.   Cognition  Cognition Arousal/Alertness: Awake/alert Behavior During Therapy: WFL for tasks assessed/performed Overall Cognitive Status: Within Functional Limits for tasks assessed Memory: Decreased recall of precautions    Balance     End of Session PT - End of Session Equipment Utilized During Treatment: Gait belt Activity Tolerance: Patient tolerated treatment well;Patient limited by fatigue Patient left: in bed;with call bell/phone within reach;with family/visitor present (Declined to sit in chair this am.) Nurse Communication: Mobility status (Encourage mobility and OOB)   GP     [name] 05/03/2013, 8:54 AM [name]. [name], [name] Acute Rehab Services Pager [phone]

## 2013-05-03 NOTE — Progress Notes (Signed)
Occupational Therapy Treatment Patient Details Name: [name] MRN: [id number] DOB: 1927/02/14 Today's Date: 05/03/2013 Time: 4098-1191 OT Time Calculation (min): 23 min  OT Assessment / Plan / Recommendation  History of present illness s/p L3-4. L4-5 lumbar lami/decompression microdiscectomy   OT comments  Pt progressing toward goals but requires frequent cueing for safety and precautions during functional transfers.  Will focus on AE education next session. Updated d/c plan to [facility] to ensure pt independence and safety with ADLs at home.  Follow Up Recommendations  Home health OT;Supervision/Assistance - 24 hour    Barriers to Discharge       Equipment Recommendations  3 in 1 bedside comode    Recommendations for Other Services    Frequency Min 2X/week   Progress towards OT Goals Progress towards OT goals: Progressing toward goals  Plan Discharge plan needs to be updated    Precautions / Restrictions Precautions Precautions: Back;Fall Precaution Comments: Reviewed back precautions. Restrictions Weight Bearing Restrictions: No   Pertinent Vitals/Pain See vitals    ADL  Grooming: Performed;Wash/dry hands;Supervision/safety Where Assessed - Grooming: Unsupported standing Lower Body Bathing: Simulated;Minimal assistance Lower Body Dressing: Performed;Minimal assistance Where Assessed - Lower Body Dressing: Unsupported sit to stand Toilet Transfer: Performed;Min guard Toilet Transfer Method: Sit to Barista: Comfort height toilet;Grab bars Toileting - Clothing Manipulation and Hygiene: Performed;Min guard Where Assessed - Engineer, mining and Hygiene: Sit to stand from 3-in-1 or toilet Equipment Used: Long-handled sponge;Rolling walker;Reacher Transfers/Ambulation Related to ADLs: min guard with RW ADL Comments: Began AE education, but pt somewhat groggy and having difficulty focusing on education. Will reinforce next session.     OT Diagnosis:    OT Problem List:   OT Treatment Interventions:     OT Goals(current goals can now be found in the care plan section) Acute Rehab OT Goals Patient Stated Goal: to return to being active OT Goal Formulation: With patient Time For Goal Achievement: 05/09/13 Potential to Achieve Goals: Good ADL Goals Pt Will Perform Lower Body Bathing: with supervision;with adaptive equipment;sit to/from stand Pt Will Perform Lower Body Dressing: with supervision;with adaptive equipment;sit to/from stand Pt Will Transfer to Toilet: with supervision;ambulating Pt Will Perform Toileting - Clothing Manipulation and hygiene: with supervision;sit to/from stand Pt Will Perform Tub/Shower Transfer: Shower transfer;with supervision;3 in 1;rolling walker Additional ADL Goal #1: Pt will perform bed mobility at supervision level as precursor for EOB ADLs.  Visit Information  Last OT Received On: 05/03/13 Assistance Needed: +1 History of Present Illness: s/p L3-4. L4-5 lumbar lami/decompression microdiscectomy    Subjective Data      Prior Functioning       Cognition  Cognition Arousal/Alertness: Awake/alert Behavior During Therapy: WFL for tasks assessed/performed Overall Cognitive Status: Within Functional Limits for tasks assessed Memory: Decreased recall of precautions    Mobility  Bed Mobility Bed Mobility: Rolling Right;Right Sidelying to Sit;Sitting - Scoot to [place] of Bed;Sit to Sidelying Right Rolling Right: 5: Supervision Right Sidelying to Sit: 4: Min guard;HOB flat Sitting - Scoot to Edge of Bed: 5: Supervision Sit to Sidelying Right: 4: Min guard;HOB flat;With rail Details for Bed Mobility Assistance: Incr time Transfers Transfers: Sit to Stand;Stand to Sit Sit to Stand: 4: Min guard;From bed;With upper extremity assist;From toilet Stand to Sit: 4: Min guard;To bed;With upper extremity assist;To toilet Details for Transfer Assistance: Requires cueing for hand placement.     Exercises      Balance     End of Session OT - End  of Session Equipment Utilized During Treatment: Gait belt;Rolling walker Activity Tolerance: Patient limited by fatigue Patient left: in bed;with call bell/phone within reach;with family/visitor present Nurse Communication: Mobility status  GO    05/03/2013 Cipriano Mile OTR/L Pager [phone] Office [phone]  Cipriano Mile 05/03/2013, 10:10 AM

## 2013-05-04 ENCOUNTER — Encounter (HOSPITAL_COMMUNITY): Payer: Self-pay | Admitting: Neurosurgery

## 2013-05-04 ENCOUNTER — Inpatient Hospital Stay (HOSPITAL_COMMUNITY): Payer: Medicare Other

## 2013-05-04 LAB — CBC WITH DIFFERENTIAL/PLATELET
Basophils Absolute: 0 10*3/uL (ref 0.0–0.1)
Basophils Relative: 0 % (ref 0–1)
Eosinophils Absolute: 0.3 10*3/uL (ref 0.0–0.7)
Eosinophils Relative: 2 % (ref 0–5)
HCT: 25 % — ABNORMAL LOW (ref 36.0–46.0)
Hemoglobin: 7.9 g/dL — ABNORMAL LOW (ref 12.0–15.0)
Lymphocytes Relative: 16 % (ref 12–46)
Lymphs Abs: 2.2 10*3/uL (ref 0.7–4.0)
MCH: 22.4 pg — ABNORMAL LOW (ref 26.0–34.0)
MCHC: 31.6 g/dL (ref 30.0–36.0)
MCV: 71 fL — ABNORMAL LOW (ref 78.0–100.0)
Monocytes Absolute: 1.5 10*3/uL — ABNORMAL HIGH (ref 0.1–1.0)
Monocytes Relative: 11 % (ref 3–12)
Neutro Abs: 9.6 10*3/uL — ABNORMAL HIGH (ref 1.7–7.7)
Neutrophils Relative %: 70 % (ref 43–77)
Platelets: 323 10*3/uL (ref 150–400)
RBC: 3.52 MIL/uL — ABNORMAL LOW (ref 3.87–5.11)
RDW: 19.5 % — ABNORMAL HIGH (ref 11.5–15.5)
WBC: 13.7 10*3/uL — ABNORMAL HIGH (ref 4.0–10.5)

## 2013-05-04 LAB — BASIC METABOLIC PANEL
BUN: 17 mg/dL (ref 6–23)
CO2: 21 mEq/L (ref 19–32)
Calcium: 8.5 mg/dL (ref 8.4–10.5)
Chloride: 96 mEq/L (ref 96–112)
Creatinine, Ser: 0.83 mg/dL (ref 0.50–1.10)
GFR calc Af Amer: 74 mL/min — ABNORMAL LOW (ref >90)
GFR calc non Af Amer: 63 mL/min — ABNORMAL LOW (ref >90)
Glucose, Bld: 111 mg/dL — ABNORMAL HIGH (ref 70–99)
Potassium: 4.8 mEq/L (ref 3.5–5.1)
Sodium: 129 mEq/L — ABNORMAL LOW (ref 135–145)

## 2013-05-04 LAB — GLUCOSE, CAPILLARY
Glucose-Capillary: 104 mg/dL — ABNORMAL HIGH (ref 70–99)
Glucose-Capillary: 112 mg/dL — ABNORMAL HIGH (ref 70–99)
Glucose-Capillary: 117 mg/dL — ABNORMAL HIGH (ref 70–99)

## 2013-05-04 MED ORDER — FUROSEMIDE 20 MG PO TABS
20.0000 mg | ORAL_TABLET | Freq: Every day | ORAL | Status: DC
  Administered 2013-05-05 – 2013-05-07 (×2): 20 mg via ORAL
  Filled 2013-05-04 (×5): qty 1

## 2013-05-04 MED ORDER — SODIUM CHLORIDE 0.9 % IV BOLUS (SEPSIS): 500.0000 mL | Freq: Once | INTRAVENOUS | Status: DC

## 2013-05-04 MED ORDER — KETOROLAC TROMETHAMINE 15 MG/ML IJ SOLN
15.0000 mg | Freq: Four times a day (QID) | INTRAMUSCULAR | Status: DC
  Filled 2013-05-04 (×3): qty 1

## 2013-05-04 MED ORDER — SODIUM CHLORIDE 1 G PO TABS
2.0000 g | ORAL_TABLET | Freq: Two times a day (BID) | ORAL | Status: DC
  Administered 2013-05-05 – 2013-05-07 (×5): 2 g via ORAL
  Filled 2013-05-04 (×8): qty 2

## 2013-05-04 NOTE — Progress Notes (Signed)
Patient's IV infiltrated for the 2nd time and I attempted a new one with no luck.. IV team came and tried to get one and after one failed attempt patient asking to not be re-stuck.  Patient receiving IV toradol for pain. I explained to the patient that she would not be able to get this anymore.  She understands.  Patient is eating and drinking, has had a BM and is voiding.  Will continue to monitor. Lance Bosch, RN

## 2013-05-04 NOTE — Progress Notes (Addendum)
Spoke with Dr. Franky Macho regarding patient recent blood work and temperature of 101.0 F.  He said he was "coming over to the hospital now."  I relayed his message to the patient and will continue to monitor patient.  Lance Bosch, RN

## 2013-05-04 NOTE — Progress Notes (Addendum)
Occupational Therapy Treatment Patient Details Name: [name] MRN: [id number] DOB: 1927/04/23 Today's Date: 05/04/2013 Time: 4098-1191 OT Time Calculation (min): 47 min  OT Assessment / Plan / Recommendation  History of present illness s/p L3-4. L4-5 lumbar lami/decompression microdiscectomy   OT comments  Pt limited today due to pain and fatigue. Practiced donning underwear with reacher, grooming at sink, and bed mobility, as well as toileting.   Follow Up Recommendations  Home health OT;Supervision/Assistance - 24 hour    Barriers to Discharge       Equipment Recommendations  3 in 1 bedside comode    Recommendations for Other Services    Frequency Min 2X/week   Progress towards OT Goals Progress towards OT goals: Progressing toward goals (part of bed mobility)  Plan Discharge plan remains appropriate    Precautions / Restrictions Precautions Precautions: Back;Fall Precaution Comments: Reviewed back precautions. Restrictions Weight Bearing Restrictions: No   Pertinent Vitals/Pain Pain 5/10 in back. Called nurse for pain meds. O2 on RA during session around 86-88% and trended up. Educated to take deep breaths. Placed back on O2 at end of session. Nurse notified.     ADL  Grooming: Performed;Teeth care;Set up;Supervision/safety Where Assessed - Grooming: Supported standing Lower Body Dressing: Performed;Moderate assistance Where Assessed - Lower Body Dressing: Supported sit to stand Toilet Transfer: Hydrographic surveyor Method: Sit to Barista: Raised toilet seat with arms (or 3-in-1 over toilet) Toileting - Clothing Manipulation and Hygiene: Min guard Where Assessed - Toileting Clothing Manipulation and Hygiene: Sit to stand from 3-in-1 or toilet Equipment Used: Gait belt;Rolling walker;Reacher;Long-handled sponge;Long-handled shoe horn Transfers/Ambulation Related to ADLs: Min guard for ambulation and Min guard/Min A for transfers. ADL  Comments: Pt practiced with reacher donning underwear and required assistance-pt with increased pain as well as fatigue- assistance with underwear as well as for balance. OT educated pt and family on donning underwear supine in bed by rolling and also discussed with spouse using belt around her for safety as she had decreased balance but explained supine and rolling may be better as we do not want her to fall with him at home until she gets stronger. Educated to have rugs picked up at home and shoes with backs and rubber sole are safest.  Pt performed teeth care at sink and OT educated on using two cups as well as setting up items on right side of sink to avoid breaking precautions. Educated also on toilet aide to assist with hygiene-cues for technique for hygiene and reinforced to be sure to maintain precautions.    OT Diagnosis:    OT Problem List:   OT Treatment Interventions:     OT Goals(current goals can now be found in the care plan section) Acute Rehab OT Goals Patient Stated Goal: to go back to work' OT Goal Formulation: With patient Time For Goal Achievement: 05/09/13 Potential to Achieve Goals: Good ADL Goals Pt Will Perform Lower Body Bathing: with supervision;with adaptive equipment;sit to/from stand Pt Will Perform Lower Body Dressing: with supervision;with adaptive equipment;sit to/from stand Pt Will Transfer to Toilet: with supervision;ambulating Pt Will Perform Toileting - Clothing Manipulation and hygiene: with supervision;sit to/from stand Pt Will Perform Tub/Shower Transfer: Shower transfer;with supervision;3 in 1;rolling walker Additional ADL Goal #1: Pt will perform bed mobility at supervision level as precursor for EOB ADLs.  Visit Information  Last OT Received On: 05/04/13 Assistance Needed: +1 History of Present Illness: s/p L3-4. L4-5 lumbar lami/decompression microdiscectomy    Subjective Data  Prior Functioning       Cognition   Cognition Arousal/Alertness: Awake/alert Behavior During Therapy: WFL for tasks assessed/performed Overall Cognitive Status: Within Functional Limits for tasks assessed    Mobility  Bed Mobility Bed Mobility: Rolling Right;Right Sidelying to Sit;Sitting - Scoot to [place] of Bed;Sit to Sidelying Right;Rolling Left Rolling Right: 4: Min assist Rolling Left: 5: Supervision Right Sidelying to Sit: 3: Mod assist;HOB flat Sitting - Scoot to Edge of Bed: 5: Supervision Sit to Sidelying Right: HOB flat;5: Supervision Details for Bed Mobility Assistance: A to lift trunk from sidelying position as well as to roll all the way on side. Cues for technique and precautions.  Transfers Transfers: Sit to Stand;Stand to Sit Sit to Stand: 4: Min assist;With upper extremity assist;From bed;4: Min guard;From chair/3-in-1 Stand to Sit: 4: Min guard;With upper extremity assist;To chair/3-in-1;To bed Details for Transfer Assistance: Min A to stand from bed. Min guard for sit <> stand on 3 in 1 and also to sit in chair. Cues to control descent when sitting in recliner chair as pt plopped.  Cues for hand placement.    Exercises      Balance     End of Session OT - End of Session Equipment Utilized During Treatment: Gait belt;Rolling walker;Oxygen Activity Tolerance: Patient limited by fatigue;Patient limited by pain Patient left: in bed;with call bell/phone within reach;with family/visitor present Nurse Communication: Other (comment);Patient requests pain meds (O2 sats)  GO     Earlie Raveling OTR/L [phone] 05/04/2013, 10:31 AM

## 2013-05-04 NOTE — Progress Notes (Signed)
PT Cancellation Note  Patient Details Name: [name] MRN: [id number] DOB: 09/28/1926   Cancelled Treatment:    Reason Eval/Treat Not Completed: Other (comment) (per RN and OT recommendations).  OT saw pt twice today in anticipation that she might go home.  Per OT the second visit she was very lethargic and her O2 sats on RA with gait were in the 70s.  RN also thinks that there is,  "something not right" with the pt and is calling the MD to see if labs should be drawn.  Since OT saw twice and pt is not discharging today PT will hold tx and check back tomorrow AM.   Thanks, Rollene Rotunda. Jennise Both, PT, DPT [phone]   05/04/2013, 4:26 PM

## 2013-05-04 NOTE — Progress Notes (Signed)
Patient very weak and every time she works with therapy her O2 sats drop into the 70's but then rebound once she is calm.  Her diastolic pressures have been running low (30-50's) all day. I have restarted her IV fluids and paged Dr. Franky Macho who ordered a CBC and BMET.  Will continue to monitor.  Lance Bosch, RN

## 2013-05-04 NOTE — Progress Notes (Signed)
   CARE MANAGEMENT NOTE 05/04/2013  Patient:  [name], [name]   Account Number:  [id number]  Date Initiated:  05/04/2013  Documentation initiated by:  Jiles Crocker  Subjective/Objective Assessment:   ADMITTED FOR SURGERY - LUMBAR LAMINECTOMY/DECOMPRESSION MICRODISCECTOMY 2 LEVELS     Action/Plan:   LIVES AT HOME WITH SPOUSE; CM FOLLOWING FOR DCP   Anticipated DC Date:  05/05/2013   Anticipated DC Plan:  HOME W HOME HEALTH SERVICES      DC Planning Services  CM consult          Status of service:  In process, will continue to follow Medicare Important Message given?  NA - LOS <3 / Initial given by admissions (If response is "NO", the following Medicare IM given date fields will be blank)  Per UR Regulation:  Reviewed for med. necessity/level of care/duration of stay  Comments:  05/04/2013- B Jacobb Alen RN,BSN,MHA

## 2013-05-04 NOTE — Progress Notes (Signed)
Patient ID: [name], female   DOB: October 23, 1926, 79 y.o.   MRN: [id number] BP 147/46  Pulse 73  Temp(Src) 99 F (37.2 C) (Oral)  Resp 18  Ht 5\' 4"  (1.626 m)  Wt 78 kg (171 lb 15.3 oz)  BMI 29.5 kg/m2  SpO2 96% Alert, feels ill Moving all extremities well Wound is clean, and dry Will give na since she is now 89, , and will start lasix daily, not prn. Chest xray, desaturates when standing

## 2013-05-04 NOTE — Progress Notes (Addendum)
Occupational Therapy Treatment Patient Details Name: [name] MRN: [id number] DOB: 10-28-1926 Today's Date: 05/04/2013 Time: 1191-4782 OT Time Calculation (min): 30 min  OT Assessment / Plan / Recommendation  History of present illness s/p L3-4. L4-5 lumbar lami/decompression microdiscectomy   OT comments  Pt practiced simulated shower transfer and OT educated family as well. Pt ambulated in hallway. Pt seemed very weak and tired during session.   Follow Up Recommendations  Home health OT;Supervision/Assistance - 24 hour    Barriers to Discharge       Equipment Recommendations  3 in 1 bedside comode    Recommendations for Other Services    Frequency Min 2X/week   Progress towards OT Goals Progress towards OT goals: Progressing toward goals  Plan Discharge plan remains appropriate    Precautions / Restrictions Precautions Precautions: Back;Fall Precaution Comments: Reviewed back precautions. Restrictions Weight Bearing Restrictions: No   Pertinent Vitals/Pain Pain 5/10. Increased activity and repositioned in bed. O2 sats in 70's during ambulation on room air but trended up (unsure if pulse oximeter was accurate). Nurse notified and pt placed back on O2. BP 151/46 sitting EOB at beginning of session.     ADL  Toilet Transfer: Simulated;Min Pension scheme manager Method: Sit to Barista: Other (comment) (from bed and BSC) Tub/Shower Transfer: Simulated;Moderate assistance Tub/Shower Transfer Method: Science writer: Walk in shower;Other (comment) (3 in 1) Equipment Used: Gait belt;Rolling walker;Other (comment) (O2) Transfers/Ambulation Related to ADLs: Min guard  ADL Comments: Practiced simulated shower transfer- assistance with walker and cues for technique. Educated and demonstrated for pt and family. Pt ambulated in hallway and O2 in 70's (unsure if pulse oximeter was accurate) Nurse notified of this.  Discussed using  long handled sponge for bathing or having husband assist with this-explained to have him with her.     OT Diagnosis:    OT Problem List:   OT Treatment Interventions:     OT Goals(current goals can now be found in the care plan section) Acute Rehab OT Goals Patient Stated Goal: to go back to work OT Goal Formulation: With patient Time For Goal Achievement: 05/09/13 Potential to Achieve Goals: Good ADL Goals Pt Will Perform Lower Body Bathing: with supervision;with adaptive equipment;sit to/from stand Pt Will Perform Lower Body Dressing: with supervision;with adaptive equipment;sit to/from stand Pt Will Transfer to Toilet: with supervision;ambulating Pt Will Perform Toileting - Clothing Manipulation and hygiene: with supervision;sit to/from stand Pt Will Perform Tub/Shower Transfer: Shower transfer;with supervision;3 in 1;rolling walker Additional ADL Goal #1: Pt will perform bed mobility at supervision level as precursor for EOB ADLs.  Visit Information  Last OT Received On: 05/04/13 Assistance Needed: +1 History of Present Illness: s/p L3-4. L4-5 lumbar lami/decompression microdiscectomy    Subjective Data      Prior Functioning       Cognition  Cognition Arousal/Alertness: Awake/alert Behavior During Therapy: WFL for tasks assessed/performed Overall Cognitive Status: Within Functional Limits for tasks assessed Memory: Decreased recall of precautions    Mobility  Bed Mobility Bed Mobility: Rolling Right;Right Sidelying to Sit;Sitting - Scoot to [place] of Bed;Sit to Sidelying Right;Rolling Left Rolling Right: 4: Min guard Rolling Left: 5: Supervision Right Sidelying to Sit: HOB flat;3: Mod assist Sitting - Scoot to Edge of Bed: 5: Supervision Sit to Sidelying Right: 3: Mod assist;HOB flat Details for Bed Mobility Assistance: A to lift both legs on bed when going from sit to right sidelying position and also to lift trunk when going from  sidelying to sitting position. Cues  for technique. Cues for precautions while adjusting in bed.  Transfers Transfers: Sit to Stand;Stand to Sit Sit to Stand: 4: Min guard;With upper extremity assist;From bed;From chair/3-in-1 Stand to Sit: 4: Min guard;With upper extremity assist;To bed;To chair/3-in-1 Details for Transfer Assistance: Min guard for safety. Cues for hand placement.  Two attempts to stand from bed    Exercises      Balance     End of Session OT - End of Session Equipment Utilized During Treatment: Gait belt;Rolling walker;Oxygen Activity Tolerance: Patient limited by fatigue Patient left: in bed;with call bell/phone within reach;with family/visitor present Nurse Communication: Other (comment);Mobility status (O2 sats and BP)  GO     Earlie Raveling OTR/L [phone] 05/04/2013, 3:45 PM

## 2013-05-05 LAB — BASIC METABOLIC PANEL
BUN: 12 mg/dL (ref 6–23)
CO2: 25 mEq/L (ref 19–32)
Calcium: 8.7 mg/dL (ref 8.4–10.5)
Chloride: 100 mEq/L (ref 96–112)
Creatinine, Ser: 0.86 mg/dL (ref 0.50–1.10)
GFR calc Af Amer: 70 mL/min — ABNORMAL LOW (ref >90)
GFR calc non Af Amer: 61 mL/min — ABNORMAL LOW (ref >90)
Glucose, Bld: 104 mg/dL — ABNORMAL HIGH (ref 70–99)
Potassium: 3.7 mEq/L (ref 3.5–5.1)
Sodium: 135 mEq/L (ref 135–145)

## 2013-05-05 LAB — GLUCOSE, CAPILLARY
Glucose-Capillary: 108 mg/dL — ABNORMAL HIGH (ref 70–99)
Glucose-Capillary: 97 mg/dL (ref 70–99)
Glucose-Capillary: 119 mg/dL — ABNORMAL HIGH (ref 70–99)
Glucose-Capillary: 113 mg/dL — ABNORMAL HIGH (ref 70–99)
Glucose-Capillary: 134 mg/dL — ABNORMAL HIGH (ref 70–99)

## 2013-05-05 NOTE — Progress Notes (Signed)
Occupational Therapy Treatment Patient Details Name: [name] MRN: [id number] DOB: Jun 29, 1927 Today's Date: 05/05/2013 Time: 4098-1191 OT Time Calculation (min): 30 min  OT Assessment / Plan / Recommendation  History of present illness s/p L3-4. L4-5 lumbar lami/decompression microdiscectomy   OT comments  Pt limited in session due to fatigue and pain. Performed toileting tasks and grooming as well as bed mobility. Pt ambulated in room, but became fatigued.   Follow Up Recommendations  Home health OT;Supervision/Assistance - 24 hour    Barriers to Discharge       Equipment Recommendations  3 in 1 bedside comode    Recommendations for Other Services    Frequency Min 2X/week   Progress towards OT Goals Progress towards OT goals: Not progressing toward goals - comment  Plan Discharge plan remains appropriate    Precautions / Restrictions Precautions Precautions: Back;Fall Precaution Comments: Reviewed back precautions. Restrictions Weight Bearing Restrictions: No   Pertinent Vitals/Pain Pain 5/10. Asked nurse if she had pain meds. Repositioned.     ADL  Grooming: Performed;Wash/dry hands;Teeth care;Min guard;Set up;Supervision/safety;Brushing hair (Min guard-standing and setup/supervision-sitting) Where Assessed - Grooming: Supported sitting;Supported standing (brushed hair and washed hands-standing and teeth-sitting) Toilet Transfer: Performed;Minimal assistance Toilet Transfer Method: Sit to Barista: Raised toilet seat with arms (or 3-in-1 over toilet) Toileting - Clothing Manipulation and Hygiene: Min guard Where Assessed - Toileting Clothing Manipulation and Hygiene: Sit to stand from 3-in-1 or toilet Equipment Used: Gait belt;Rolling walker;Other (comment) (O2) Transfers/Ambulation Related to ADLs: Min guard/Min A for ambulation-pt losing balance at times. Min guard/Min A for transfers. ADL Comments: Pt performed toileting tasks as well as  grooming. Pt performed hygiene and OT gave cues for precautions and explained technique-told pt to get someone to help her with hygiene while she is here until she has tongs to use at home. Pt stood to wash hands and then sat for teeth care as she was fatigued. Pt ambulated in room. Practiced bed mobility and educated family.     OT Diagnosis:    OT Problem List:   OT Treatment Interventions:     OT Goals(current goals can now be found in the care plan section) Acute Rehab OT Goals Patient Stated Goal: to go back to work OT Goal Formulation: With patient Time For Goal Achievement: 05/09/13 Potential to Achieve Goals: Good ADL Goals Pt Will Perform Lower Body Bathing: with supervision;with adaptive equipment;sit to/from stand Pt Will Perform Lower Body Dressing: with supervision;with adaptive equipment;sit to/from stand Pt Will Transfer to Toilet: with supervision;ambulating Pt Will Perform Toileting - Clothing Manipulation and hygiene: with supervision;sit to/from stand Pt Will Perform Tub/Shower Transfer: Shower transfer;with supervision;3 in 1;rolling walker Additional ADL Goal #1: Pt will perform bed mobility at supervision level as precursor for EOB ADLs.  Visit Information  Last OT Received On: 05/05/13 Assistance Needed: +1 History of Present Illness: s/p L3-4. L4-5 lumbar lami/decompression microdiscectomy    Subjective Data      Prior Functioning       Cognition  Cognition Arousal/Alertness: Awake/alert Behavior During Therapy: WFL for tasks assessed/performed Overall Cognitive Status: Within Functional Limits for tasks assessed    Mobility  Bed Mobility Bed Mobility: Rolling Left;Rolling Right;Right Sidelying to Sit;Sitting - Scoot to [place] of Bed Rolling Right: 4: Min assist Rolling Left: 4: Min assist;With rail Right Sidelying to Sit: 3: Mod assist;HOB flat Sitting - Scoot to Edge of Bed: 4: Min guard Sit to Sidelying Right: 3: Mod assist;HOB flat Details for  Bed  Mobility Assistance: A to lift trunk from sidelying position and also to lift legs onto bed when going to sidelying position.  Transfers Transfers: Sit to Stand;Stand to Sit Sit to Stand: 4: Min guard;4: Min assist;With upper extremity assist;From bed;From chair/3-in-1 Stand to Sit: 4: Min guard;With upper extremity assist;To bed;To chair/3-in-1 Details for Transfer Assistance: Cues for hand placement and to have walker in front when sitting down as she was in a hurry to sit on 3 in 1 to urinate.     Exercises      Balance     End of Session OT - End of Session Equipment Utilized During Treatment: Gait belt;Rolling walker;Oxygen Activity Tolerance: Patient limited by fatigue;Patient limited by pain Patient left: in bed;with call bell/phone within reach;with family/visitor present Nurse Communication: Other (comment) (asked nurse in room if she had pain meds)  GO     Earlie Raveling OTR/L [phone] 05/05/2013, 9:30 AM

## 2013-05-05 NOTE — Progress Notes (Signed)
Physical Therapy Treatment Patient Details Name: [name] MRN: [id number] DOB: 23-Sep-1926 Today's Date: 05/05/2013 Time: 7829-5621 PT Time Calculation (min): 24 min  PT Assessment / Plan / Recommendation  History of Present Illness s/p L3-4. L4-5 lumbar lami/decompression microdiscectomy   PT Comments   Slower today. Appears more fatigued. Noted cough, she reports this was new after surgery. Encouraged use of incentive spirometer of which she performed x10 following our session with cues for technique. Utilized teach back method for back precautions and frequency of incentive spirometer. Does not feel she will have enough support at home. Will update d/c recommendations.   Follow Up Recommendations  SNF     Does the patient have the potential to tolerate intense rehabilitation     Barriers to Discharge        Equipment Recommendations  Rolling walker with 5" wheels    Recommendations for Other Services    Frequency Min 5X/week   Progress towards PT Goals Progress towards PT goals: Progressing toward goals (slowly)  Plan Current plan remains appropriate;Discharge plan needs to be updated    Precautions / Restrictions Precautions Precautions: Fall;Back Precaution Comments: Reviewed back precautions. Restrictions Weight Bearing Restrictions: No   Pertinent Vitals/Pain Pain not rated but grimaces with standing which she says improves with ambulation    Mobility  Bed Mobility Bed Mobility: Rolling Right;Right Sidelying to Sit Rolling Right: With rail Right Sidelying to Sit: HOB elevated;With rails;4: Min assist Sitting - Scoot to [place] of Bed: 4: Min assist Details for Bed Mobility Assistance: facilitation to lift trunk and use of pad for reciprocal scoot Transfers Transfers: Sit to Stand;Stand to Sit Sit to Stand: 4: Min assist;With upper extremity assist;From bed;From toilet Stand to Sit: 4: Min assist;With upper extremity assist;To chair/3-in-1;To toilet Details for  Transfer Assistance: Cues for hand placement and to have walker in front when sitting down as she was in a hurry to sit on 3 in 1 to urinate; cues for safe hand placement Ambulation/Gait Ambulation/Gait Assistance: 4: Min guard Ambulation Distance (Feet): 150 Feet Assistive device: Rolling walker Ambulation/Gait Assistance Details: cues for tall posture, close gaurding as pt a little shaky and weak today Gait Pattern: Step-through pattern;Decreased stride length Gait velocity: Slow gait      PT Goals (current goals can now be found in the care plan section)    Visit Information  Last PT Received On: 05/05/13 Assistance Needed: +1 History of Present Illness: s/p L3-4. L4-5 lumbar lami/decompression microdiscectomy    Subjective Data      Cognition  Cognition Arousal/Alertness: Awake/alert Behavior During Therapy: WFL for tasks assessed/performed Area of Impairment: Problem solving Problem Solving: Slow processing General Comments: slower than on evaluation, appears tired    Balance     End of Session PT - End of Session Equipment Utilized During Treatment: Gait belt Activity Tolerance: Patient tolerated treatment well Patient left: in chair;with call bell/phone within reach Nurse Communication: Mobility status   GP     Ludger Nutting 05/05/2013, 5:20 PM

## 2013-05-05 NOTE — Progress Notes (Signed)
Patient ID: [name], female   DOB: [birth year], 79 y.o.   MRN: [id number] BP 145/46  Pulse 65  Temp(Src) 98.2 F (36.8 C) (Oral)  Resp 20  Ht 5\' 4"  (1.626 m)  Wt 78 kg (171 lb 15.3 oz)  BMI 29.5 kg/m2  SpO2 97% Alert and oriented x 4 Moving all extremities well Na is now 135, will give again tonight. Will transfuse 2units prbc tomorrow, which i think will help with oxygenation and fatigue.

## 2013-05-05 NOTE — Progress Notes (Signed)
CM  CONSULT Talked to patient and spouse about discharge planning, patient wants to return home but does not feel as if she is strong enough to go home at discharge. Patient lives with spouse and her daughter is here from [place] visiting. Patient is agreeable to short term SNF at discharge and request [facility] skilled nursing facility; Lily Peer Worker made aware and will see the patientAlexis Goodell [phone]

## 2013-05-06 LAB — GLUCOSE, CAPILLARY
Glucose-Capillary: 112 mg/dL — ABNORMAL HIGH (ref 70–99)
Glucose-Capillary: 126 mg/dL — ABNORMAL HIGH (ref 70–99)
Glucose-Capillary: 119 mg/dL — ABNORMAL HIGH (ref 70–99)
Glucose-Capillary: 117 mg/dL — ABNORMAL HIGH (ref 70–99)

## 2013-05-06 LAB — CBC
HCT: 23.7 % — ABNORMAL LOW (ref 36.0–46.0)
HCT: 34.6 % — ABNORMAL LOW (ref 36.0–46.0)
Hemoglobin: 7.5 g/dL — ABNORMAL LOW (ref 12.0–15.0)
Hemoglobin: 11.8 g/dL — ABNORMAL LOW (ref 12.0–15.0)
MCH: 22.4 pg — ABNORMAL LOW (ref 26.0–34.0)
MCH: 25.1 pg — ABNORMAL LOW (ref 26.0–34.0)
MCHC: 31.6 g/dL (ref 30.0–36.0)
MCHC: 34.1 g/dL (ref 30.0–36.0)
MCV: 70.7 fL — ABNORMAL LOW (ref 78.0–100.0)
MCV: 73.5 fL — ABNORMAL LOW (ref 78.0–100.0)
Platelets: 329 10*3/uL (ref 150–400)
Platelets: 329 10*3/uL (ref 150–400)
RBC: 3.35 MIL/uL — ABNORMAL LOW (ref 3.87–5.11)
RBC: 4.71 MIL/uL (ref 3.87–5.11)
RDW: 19.5 % — ABNORMAL HIGH (ref 11.5–15.5)
RDW: 18.5 % — ABNORMAL HIGH (ref 11.5–15.5)
WBC: 14.1 10*3/uL — ABNORMAL HIGH (ref 4.0–10.5)
WBC: 16.3 10*3/uL — ABNORMAL HIGH (ref 4.0–10.5)

## 2013-05-06 LAB — PREPARE RBC (CROSSMATCH)

## 2013-05-06 LAB — PROTIME-INR
INR: 1.07 (ref 0.00–1.49)
INR: 1.02 (ref 0.00–1.49)
Prothrombin Time: 13.7 seconds (ref 11.6–15.2)
Prothrombin Time: 13.2 seconds (ref 11.6–15.2)

## 2013-05-06 LAB — APTT
aPTT: 34 seconds (ref 24–37)
aPTT: 35 seconds (ref 24–37)

## 2013-05-06 MED ORDER — DIPHENHYDRAMINE HCL 25 MG PO CAPS
25.0000 mg | ORAL_CAPSULE | Freq: Once | ORAL | Status: AC
  Administered 2013-05-06: 25 mg via ORAL
  Filled 2013-05-06 (×2): qty 1

## 2013-05-06 MED ORDER — ACETAMINOPHEN 325 MG PO TABS
650.0000 mg | ORAL_TABLET | Freq: Once | ORAL | Status: AC
  Administered 2013-05-06: 650 mg via ORAL
  Filled 2013-05-06: qty 2

## 2013-05-06 MED ORDER — FUROSEMIDE 10 MG/ML IJ SOLN
20.0000 mg | Freq: Once | INTRAMUSCULAR | Status: AC
  Administered 2013-05-06: 20 mg via INTRAVENOUS
  Filled 2013-05-06: qty 2

## 2013-05-06 NOTE — Progress Notes (Signed)
Pt is getting 2nd unit of RBC, tolerates well.  [name]

## 2013-05-06 NOTE — Progress Notes (Signed)
Patient ID: [name], female   DOB: 08/26/1927, 79 y.o.   MRN: [id number] BP 164/56  Pulse 65  Temp(Src) 98.4 F (36.9 C) (Oral)  Resp 16  Ht 5\' 4"  (1.626 m)  Wt 78 kg (171 lb 15.3 oz)  BMI 29.5 kg/m2  SpO2 92% Alert, feeling better Moving lower extremities well Wound is clean, dry, and without signs of infection Transfusing 2 units today Possible transfer tomorrow.

## 2013-05-06 NOTE — Clinical Social Work Psychosocial (Signed)
Clinical Social Work Department BRIEF PSYCHOSOCIAL ASSESSMENT 05/06/2013  Patient:  [name], [name]     Account Number:  [id number]     Admit date:  05/01/2013  Clinical Social Worker:  Sherre Lain  Date/Time:  05/06/2013 03:08 PM  Referred by:  Physician  Date Referred:  05/06/2013 Referred for  SNF Placement   Other Referral:   none.   Interview type:  Patient Other interview type:   Pt's daughter and pt's husband were present at bedside.    PSYCHOSOCIAL DATA Living Status:  HUSBAND Admitted from facility:   Level of care:   Primary support name:  [name] Primary support relationship to patient:  SPOUSE Degree of support available:   Strong.    CURRENT CONCERNS Current Concerns  Post-Acute Placement   Other Concerns:   none.    SOCIAL WORK ASSESSMENT / PLAN CSW met with pt, pt's daughter, and pt's husband at bedside. Pt stated that prior to being admitted to [facility] she was living at home with her husband. Pt was understanding and agreeable to PT recommendations for SNF placement once medically discharged from [facility]. Pt stated that her first preference for SNF would be [facility] in [facility]. CSW to continue to follow and assist with discharge planning needs.   Assessment/plan status:  Psychosocial Support/Ongoing Assessment of Needs Other assessment/ plan:   none.   Information/referral to community resources:   [facility] SNF placement.    PATIENTS/FAMILYS RESPONSE TO PLAN OF CARE: Pt and pt's family were understanding and agreeable to CSW plan of care.       Darlyn Chamber, MSW, LCSWA Clinical Social Work [phone]

## 2013-05-06 NOTE — Progress Notes (Signed)
Occupational Therapy Treatment Patient Details Name: [name] MRN: [id number] DOB: [birth year] Today's Date: 05/06/2013 Time: 4696-2952 OT Time Calculation (min): 26 min  OT Assessment / Plan / Recommendation       OT comments  Mod a for all aspects of bed mobility with hob flat and no rails to simulate home environment.  Sim. toileting with min guard a.  Declined a/e practice today stating it had already been reviewed with her.  Still reports being tired but pain is managed well 3/10 per pt.  Follow Up Recommendations  Home health OT;Supervision/Assistance - 24 hour                      Frequency Min 2X/week   Progress towards OT Goals Progress towards OT goals: Progressing toward goals  Plan Discharge plan remains appropriate    Precautions / Restrictions Precautions Precautions: Fall;Back Precaution Comments: pt. able to recall 3/3    Pertinent Vitals/Pain 3/10, repositioned    ADL  Where Assessed - Lower Body Bathing: Other (comment) (declined a/e review) Where Assessed - Lower Body Dressing: Other (comment) (declined a/e review) Toilet Transfer: Simulated;Min Pension scheme manager Method: Stand pivot Toileting - Clothing Manipulation and Hygiene: Simulated;Minimal assistance Where Assessed - Toileting Clothing Manipulation and Hygiene: Standing Transfers/Ambulation Related to ADLs: min guard/min a from elevated surface ADL Comments: performed sim. toilet transfer with min guard a       OT Goals(current goals can now be found in the care plan section)    Visit Information  Last OT Received On: 05/06/13                 Cognition  Cognition Arousal/Alertness: Awake/alert Behavior During Therapy: [facility] for tasks assessed/performed Overall Cognitive Status: Within Functional Limits for tasks assessed    Mobility  Bed Mobility Bed Mobility: Rolling Right;Right Sidelying to Sit;Sitting - Scoot to [place] of Bed Rolling Right: 3: Mod assist Right  Sidelying to Sit: 3: Mod assist;HOB flat Sitting - Scoot to Edge of Bed: 4: Min assist Details for Bed Mobility Assistance: hob flat, no rails pt. required mod a, difficulty initiating roll. has positioning and sequencing correct but limited by pain Transfers Transfers: Sit to Stand;Stand to Sit Sit to Stand: 4: Min guard;With upper extremity assist;From bed;From elevated surface Stand to Sit: 4: Min guard;With upper extremity assist;With armrests;To chair/3-in-1 Details for Transfer Assistance: cues for hand placement on arm rests               End of Session OT - End of Session Equipment Utilized During Treatment: Rolling walker Activity Tolerance: Patient limited by fatigue;Patient limited by pain Patient left: in chair;with call bell/phone within reach       Robet Leu, COTA/L 05/06/2013, 8:23 AM

## 2013-05-06 NOTE — Clinical Social Work Placement (Signed)
Clinical Social Work Department CLINICAL SOCIAL WORK PLACEMENT NOTE 05/06/2013  Patient:  RILIE, GLANZ  Account Number:  [id number] Admit date:  05/01/2013  Clinical Social Worker:  Irving Burton [place], LCSWA  Date/time:  05/06/2013 03:12 PM  Clinical Social Work is seeking post-discharge placement for this patient at the following level of care:   SKILLED NURSING   (*CSW will update this form in Epic as items are completed)   05/06/2013  Patient/family provided with [facility] Department of Clinical Social Works list of facilities offering this level of care within the geographic area requested by the patient (or if unable, by the patients family).  05/06/2013  Patient/family informed of their freedom to choose among providers that offer the needed level of care, that participate in Medicare, Medicaid or managed care program needed by the patient, have an available bed and are willing to accept the patient.  05/06/2013  Patient/family informed of [facility] ownership interest in [facility], as well as of the fact that they are under no obligation to receive care at this facility.  PASARR submitted to EDS on 05/06/2013 PASARR number received from EDS on 05/06/2013  FL2 transmitted to all facilities in geographic area requested by pt/family on  05/06/2013 FL2 transmitted to all facilities within larger geographic area on   Patient informed that his/her managed care company has contracts with or will negotiate with  certain facilities, including the following:     Patient/family informed of bed offers received:   Patient chooses bed at  Physician recommends and patient chooses bed at    Patient to be transferred to  on   Patient to be transferred to facility by   The following physician request were entered in Epic:   Additional Comments:  Darlyn Chamber, MSW, LCSWA Clinical Social Work [phone]

## 2013-05-06 NOTE — Progress Notes (Signed)
Physical Therapy Treatment Patient Details Name: [name] MRN: [id number] DOB: 28-Mar-1927 Today's Date: 05/06/2013 Time: 4696-2952 PT Time Calculation (min): 25 min  PT Assessment / Plan / Recommendation  History of Present Illness s/p L3-4. L4-5 lumbar lami/decompression microdiscectomy   PT Comments   Slow progress again today however able to ambulate. Plans to transfuse 2 units of blood. Hopeful this will give her more energy. Still plans for SNF.   Follow Up Recommendations  SNF     Does the patient have the potential to tolerate intense rehabilitation     Barriers to Discharge        Equipment Recommendations  Rolling walker with 5" wheels    Recommendations for Other Services    Frequency Min 5X/week   Progress towards PT Goals Progress towards PT goals: Progressing toward goals  Plan Current plan remains appropriate    Precautions / Restrictions Precautions Precautions: Fall;Back Precaution Comments: pt. able to recall 3/3 (not with BAT terminology but able to describe)   Pertinent Vitals/Pain See pain section; upon entering her room the patient did not have her O2 on, SaO2 82% so I reapplied this and it rose to 97% on 2 liters    Mobility  Bed Mobility Bed Mobility: Sit to Sidelying Right;Rolling Left Rolling Left: 5: Supervision Sit to Sidelying Right: 4: Min assist Details for Bed Mobility Assistance: assist to lift legs to bed, cues for technique Transfers Transfers: Sit to Stand;Stand to Sit Sit to Stand: 4: Min assist;From chair/3-in-1;With upper extremity assist Stand to Sit: 4: Min assist;To bed;With upper extremity assist Details for Transfer Assistance: assist for follow through and for anterior weight shift Ambulation/Gait Ambulation/Gait Assistance: 4: Min assist Ambulation Distance (Feet): 150 Feet Assistive device: Rolling walker Ambulation/Gait Assistance Details: cues for safe positioning with RW and tall posture, 1 standing rest Gait  Pattern: Step-through pattern;Decreased stride length Gait velocity: Slow gait      PT Goals (current goals can now be found in the care plan section)    Visit Information  Last PT Received On: 05/06/13 Assistance Needed: +1 History of Present Illness: s/p L3-4. L4-5 lumbar lami/decompression microdiscectomy    Subjective Data      Cognition  Cognition Arousal/Alertness: Awake/alert Behavior During Therapy: WFL for tasks assessed/performed Overall Cognitive Status: Within Functional Limits for tasks assessed General Comments: slower than on evaluation, appears tired, plans for 2 units RBC today    Balance     End of Session PT - End of Session Equipment Utilized During Treatment: Gait belt Activity Tolerance: Patient tolerated treatment well;Patient limited by fatigue Patient left: with call bell/phone within reach;in bed Nurse Communication: Mobility status   GP     Ludger Nutting 05/06/2013, 4:33 PM

## 2013-05-07 LAB — TYPE AND SCREEN
ABO/RH(D): O NEG
Antibody Screen: POSITIVE
DAT, IgG: NEGATIVE
Unit division: 0
Unit division: 0

## 2013-05-07 LAB — GLUCOSE, CAPILLARY
Glucose-Capillary: 119 mg/dL — ABNORMAL HIGH (ref 70–99)
Glucose-Capillary: 110 mg/dL — ABNORMAL HIGH (ref 70–99)
Glucose-Capillary: 111 mg/dL — ABNORMAL HIGH (ref 70–99)

## 2013-05-07 MED ORDER — HYDROCODONE-ACETAMINOPHEN 5-325 MG PO TABS: 1.0000 | ORAL_TABLET | Freq: Four times a day (QID) | ORAL | Status: DC | PRN

## 2013-05-07 MED ORDER — ONDANSETRON 4 MG PO TBDP
4.0000 mg | ORAL_TABLET | ORAL | Status: DC | PRN
  Administered 2013-05-07 (×2): 4 mg via ORAL
  Filled 2013-05-07: qty 1

## 2013-05-07 MED ORDER — CYCLOBENZAPRINE HCL 10 MG PO TABS: 10.0000 mg | ORAL_TABLET | Freq: Three times a day (TID) | ORAL | Status: DC | PRN

## 2013-05-07 MED ORDER — ONDANSETRON 4 MG PO TBDP
ORAL_TABLET | ORAL | Status: AC
  Administered 2013-05-07: 4 mg via ORAL
  Filled 2013-05-07: qty 1

## 2013-05-07 NOTE — Discharge Summary (Signed)
Physician Discharge Summary  Patient ID: [name] MRN: [id number] DOB/AGE: May 17, 1927 79 y.o.  Admit date: 05/01/2013 Discharge date: 05/07/2013  Admission Diagnoses:Lumbar stenosis L3-5  Discharge Diagnoses: Lumbar Stenosis L3-5 Post op anemia,  Active Problems:   * No active hospital problems. *   Discharged Condition: good  Hospital Course: [name] was admitted and taken to the operating room where she underwent an uncomplicated lumbar laminectomy for decompression. Post op she had very good relief of pain in the lower extremities, but did feel fatigued. CBC showed a hematocrit in the mid 20's. I eventually transfused 2 units of PRBC, and she felt much better. There was no significant blood loss during the case. She is ambulating, voiding, and tolerating a regular diet.   Consults: None  Significant Diagnostic Studies: none  Treatments: surgery: LUMBAR LAMINECTOMY/DECOMPRESSION MICRODISCECTOMY 2 LEVELS  L4, and L3 laminectomies for decompression of the L3, L4, and L5 roots.  and blood transfusion post op  Discharge Exam: Blood pressure 133/49, pulse 63, temperature 98.4 F (36.9 C), temperature source Oral, resp. rate 18, height 5\' 4"  (1.626 m), weight 78 kg (171 lb 15.3 oz), SpO2 95.00%. General appearance: alert, cooperative, appears stated age and no distress Neurologic: Alert and oriented X 3, normal strength and tone. Normal symmetric reflexes. Normal coordination and gait Wound is clean, dry, and without signs of infection.   Disposition: 01-Home or Self Care   Future Appointments Provider Department Dept Phone   08/13/2013 1:45 PM Rollene Rotunda, MD [place] [facility] Main Office [place]) [phone]       Medication List         amLODipine 5 MG tablet  Commonly known as:  NORVASC  Take 5 mg by mouth daily.     aspirin 81 MG tablet  Take 81 mg by mouth daily.     cyclobenzaprine 10 MG tablet  Commonly known as:  FLEXERIL  Take 1 tablet (10 mg  total) by mouth 3 (three) times daily as needed for muscle spasms.     fish oil-omega-3 fatty acids 1000 MG capsule  Take 1 g by mouth 2 (two) times daily.     furosemide 20 MG tablet  Commonly known as:  LASIX  Take 20 mg by mouth 2 (two) times daily as needed for fluid.     HYDROcodone-acetaminophen 5-325 MG per tablet  Commonly known as:  NORCO/VICODIN  Take 1 tablet by mouth every 6 (six) hours as needed for pain.     HYDROcodone-acetaminophen 5-325 MG per tablet  Commonly known as:  NORCO/VICODIN  Take 1 tablet by mouth every 6 (six) hours as needed for pain.     LORazepam 1 MG tablet  Commonly known as:  ATIVAN  Take 1 mg by mouth at bedtime.     metoprolol tartrate 25 MG tablet  Commonly known as:  LOPRESSOR  Take 25 mg by mouth 2 (two) times daily.     omeprazole 20 MG capsule  Commonly known as:  PRILOSEC  Take 20 mg by mouth daily.     ONE TOUCH ULTRA TEST test strip  Generic drug:  glucose blood     ONETOUCH DELICA LANCETS Misc     sertraline 100 MG tablet  Commonly known as:  ZOLOFT  Take 100 mg by mouth at bedtime.           Follow-up Information   Follow up with Saif Peter L, MD. (call to make appointment when released from Rehabilitation)    Specialty:  Neurosurgery  Contact information:   [address]                         [address] [place] [address] [phone]       Signed: Aerionna Moravek L 05/07/2013, 1:32 PM

## 2013-05-07 NOTE — Clinical Social Work Note (Signed)
CSW spoke with pt and pt's family at bedside. Pt's first choice for SNF placement upon medical discharge is [facility] in [place], [place]. [facility] contacted CSW on 05/07/2013 to confirm that [facility] is able to provide SNF placement for pt as early as today if medically ready to be discharged. CSW updated family on [facility] SNF approval. CSW will continue to follow and assist with discharge once pt is stable and medically ready for transfer to SNF placement.  Darlyn Chamber, MSW, LCSWA Clinical Social Work [phone]

## 2013-05-07 NOTE — Progress Notes (Signed)
Tried to wean her off from O2, but the O2 sat been dropping down to lower 80's. Noticed this to her daughter who was planning to give the patient a ride in her car to the SNF. Reccommended to transport in ambulance, they are aggreble so called non emergency ambulance for transportation. Called and give report to [facility], waiting for transportation.

## 2013-05-07 NOTE — Progress Notes (Signed)
Physical Therapy Treatment Patient Details Name: [name] MRN: [id number] DOB: November 08, 1926 Today's Date: 05/07/2013 Time: 1202-1217 PT Time Calculation (min): 15 min  PT Assessment / Plan / Recommendation  History of Present Illness s/p L3-4. L4-5 lumbar lami/decompression microdiscectomy   PT Comments   Pt making steady progress.  Follow Up Recommendations  SNF     Does the patient have the potential to tolerate intense rehabilitation     Barriers to Discharge        Equipment Recommendations  Rolling walker with 5" wheels    Recommendations for Other Services    Frequency Min 5X/week   Progress towards PT Goals Progress towards PT goals: Progressing toward goals  Plan Current plan remains appropriate    Precautions / Restrictions Precautions Precautions: Fall;Back   Pertinent Vitals/Pain No c/o's.    Mobility  Bed Mobility Rolling Right: 4: Min assist Right Sidelying to Sit: 3: Mod assist;HOB flat Sitting - Scoot to Edge of Bed: 4: Min assist Details for Bed Mobility Assistance: Assist to bring trunk up. Transfers Sit to Stand: 4: Min assist;With upper extremity assist;From bed Stand to Sit: 4: Min assist;With upper extremity assist;To chair/3-in-1;With armrests Details for Transfer Assistance: Assist to bring hips up Ambulation/Gait Ambulation/Gait Assistance: 4: Min assist Ambulation Distance (Feet): 150 Feet Gait Pattern: Step-through pattern;Decreased stride length Gait velocity: Slow gait    Exercises     PT Diagnosis:    PT Problem List:   PT Treatment Interventions:     PT Goals (current goals can now be found in the care plan section)    Visit Information  Last PT Received On: 05/07/13 Assistance Needed: +1 History of Present Illness: s/p L3-4. L4-5 lumbar lami/decompression microdiscectomy    Subjective Data      Cognition  Cognition Arousal/Alertness: Awake/alert Behavior During Therapy: WFL for tasks assessed/performed Overall  Cognitive Status: Within Functional Limits for tasks assessed    Balance  Balance Balance Assessed: Yes Static Standing Balance Static Standing - Balance Support: Bilateral upper extremity supported Static Standing - Level of Assistance: 4: Min assist  End of Session PT - End of Session Equipment Utilized During Treatment: Gait belt Activity Tolerance: Patient tolerated treatment well Patient left: in chair;with call bell/phone within reach;with family/visitor present   GP     [name] 05/07/2013, 1:11 PM  [employer] PT [phone]

## 2013-08-13 ENCOUNTER — Encounter: Payer: Self-pay | Admitting: Cardiology

## 2013-08-13 ENCOUNTER — Ambulatory Visit (INDEPENDENT_AMBULATORY_CARE_PROVIDER_SITE_OTHER): Payer: Medicare Other | Admitting: Cardiology

## 2013-08-13 VITALS — BP 151/68 | HR 68 | Ht 64.5 in | Wt 153.8 lb

## 2013-08-13 DIAGNOSIS — I6523 Occlusion and stenosis of bilateral carotid arteries: Secondary | ICD-10-CM

## 2013-08-13 DIAGNOSIS — I658 Occlusion and stenosis of other precerebral arteries: Secondary | ICD-10-CM

## 2013-08-13 DIAGNOSIS — I2510 Atherosclerotic heart disease of native coronary artery without angina pectoris: Secondary | ICD-10-CM

## 2013-08-13 DIAGNOSIS — I6529 Occlusion and stenosis of unspecified carotid artery: Secondary | ICD-10-CM

## 2013-08-13 NOTE — Progress Notes (Signed)
HPI The patient presents for evaulation of known coronary disease. Since I last saw her she has done well from a cardiovascular standpoint.  She was in [facility] with dehydration earlier in the year.  She has had back surgery.   She does have chronic dyspnea with exertion.  However, she does not have angina.  She denies any resting SOB.  She has no PND or orthopnea.  She has no neck or arm discomfort. There have been no reported palpitations, presyncope or syncope.    Allergies  Allergen Reactions  . Atorvastatin     Muscle pain  . Penicillins   . Statins     MUSCLE PAIN    Current Outpatient Prescriptions  Medication Sig Dispense Refill  . amLODipine (NORVASC) 5 MG tablet Take 5 mg by mouth daily.      Marland Kitchen aspirin 81 MG tablet Take 81 mg by mouth daily.        . fish oil-omega-3 fatty acids 1000 MG capsule Take 1 g by mouth 2 (two) times daily.       . furosemide (LASIX) 20 MG tablet Take 20 mg by mouth 2 (two) times daily as needed for fluid.       Marland Kitchen HYDROcodone-acetaminophen (NORCO/VICODIN) 5-325 MG per tablet Take 1 tablet by mouth every 6 (six) hours as needed for pain.      Marland Kitchen HYDROcodone-acetaminophen (NORCO/VICODIN) 5-325 MG per tablet Take 1 tablet by mouth every 6 (six) hours as needed for pain.  70 tablet  0  . LORazepam (ATIVAN) 1 MG tablet Take 1 mg by mouth at bedtime.      . metoprolol tartrate (LOPRESSOR) 25 MG tablet Take 25 mg by mouth 2 (two) times daily.        Marland Kitchen omeprazole (PRILOSEC) 20 MG capsule Take 20 mg by mouth daily.        . ONE TOUCH ULTRA TEST test strip       . ONETOUCH DELICA LANCETS MISC       . sertraline (ZOLOFT) 100 MG tablet Take 100 mg by mouth at bedtime.       No current facility-administered medications for this visit.    Past Medical History  Diagnosis Date  . CAD (coronary artery disease)   . HTN (hypertension)   . Dyslipidemia   . Cardiomyopathy, ischemic     EF 40% at the time cath  . Diabetes mellitus without complication       borderline no meds  . Headache(784.0)   . Arthritis   . Anemia     hx    Past Surgical History  Procedure Laterality Date  . Coronary artery bypass graft      LIMA to the LAD, SVG to diagonal, SVG to obtuse marginal, SVG to PDA..  2008  . Shoulder arthroscopy w/ rotator cuff repair Right     04  . Appendectomy    . Tonsillectomy    . Cesarean section      x2  . Lumbar laminectomy/decompression microdiscectomy N/A 05/01/2013    Procedure: LUMBAR LAMINECTOMY/DECOMPRESSION MICRODISCECTOMY 2 LEVELS;  Surgeon: Carmela Hurt, MD;  Location: [facility];  Service: Neurosurgery;  Laterality: N/A;  Lumbar Three-Four, Four-Five Laminectomies     ROS:  As stated in the HPI and negative for all other systems.  PHYSICAL EXAM BP 151/68  Pulse 68  Ht 5' 4.5" (1.638 m)  Wt 153 lb 12.8 oz (69.763 kg)  BMI 26.00 kg/m2 GENERAL:  Well appearing HEENT:  Pupils equal round and reactive, fundi not visualized, oral mucosa unremarkable NECK:  No jugular venous distention, waveform within normal limits, carotid upstroke brisk and symmetric, soft bilateral bruits, no thyromegaly or LYMPHATICS:  No cervical, inguinal adenopathy LUNGS:  Clear to auscultation bilaterally BACK:  No CVA tenderness CHEST:  Well healed sternotomy scar. HEART:  PMI not displaced or sustained,S1 and S2 within normal limits, no S3, no S4, no clicks, no rubs, no murmurs ABD:  Flat, positive bowel sounds normal in frequency in pitch, no bruits, no rebound, no guarding, no midline pulsatile mass, no hepatomegaly, no splenomegaly EXT:  2 plus pulses throughout, no edema, no cyanosis no clubbing   EKG:  Sinus rhythm, rate 68, axis within normal limits, intervals within normal limits, no acute ST-T wave changes.  IRBBB.   08/13/2013.   ASSESSMENT AND PLAN  CAD - She has no new symptoms. We will continue aggressive risk reduction. No further studies are indicated at this time.   HYPERTENSION - Blood pressure is well  controlled. She will continue on the meds as listed.  HYPERLIPIDEMIA -  She is intolerant of statins.   I will defer management of her lipids to [facility],KIRSTEN, MD  CAROTID STENOSIS-  She is due to have carotid Doppler follow up this year.

## 2013-08-13 NOTE — Patient Instructions (Signed)

## 2013-08-25 ENCOUNTER — Ambulatory Visit (HOSPITAL_COMMUNITY): Payer: Medicare Other | Attending: Cardiovascular Disease

## 2013-08-25 ENCOUNTER — Encounter: Payer: Self-pay | Admitting: Cardiovascular Disease

## 2013-08-25 DIAGNOSIS — R0989 Other specified symptoms and signs involving the circulatory and respiratory systems: Secondary | ICD-10-CM | POA: Insufficient documentation

## 2013-08-25 DIAGNOSIS — E785 Hyperlipidemia, unspecified: Secondary | ICD-10-CM | POA: Insufficient documentation

## 2013-08-25 DIAGNOSIS — I2510 Atherosclerotic heart disease of native coronary artery without angina pectoris: Secondary | ICD-10-CM | POA: Insufficient documentation

## 2013-08-25 DIAGNOSIS — I6523 Occlusion and stenosis of bilateral carotid arteries: Secondary | ICD-10-CM

## 2013-08-25 DIAGNOSIS — I658 Occlusion and stenosis of other precerebral arteries: Secondary | ICD-10-CM | POA: Insufficient documentation

## 2013-08-25 DIAGNOSIS — I6529 Occlusion and stenosis of unspecified carotid artery: Secondary | ICD-10-CM

## 2013-08-25 DIAGNOSIS — I10 Essential (primary) hypertension: Secondary | ICD-10-CM | POA: Insufficient documentation

## 2014-06-17 ENCOUNTER — Other Ambulatory Visit (HOSPITAL_COMMUNITY): Payer: Self-pay | Admitting: Neurosurgery

## 2014-07-08 ENCOUNTER — Encounter (HOSPITAL_COMMUNITY): Payer: Self-pay

## 2014-07-08 ENCOUNTER — Encounter (HOSPITAL_COMMUNITY)
Admission: RE | Admit: 2014-07-08 | Discharge: 2014-07-08 | Disposition: A | Discharge status: Home / Self Care | Payer: Medicare Other | Source: Ambulatory Visit | Attending: Neurosurgery | Admitting: Neurosurgery

## 2014-07-08 DIAGNOSIS — Z01818 Encounter for other preprocedural examination: Secondary | ICD-10-CM | POA: Insufficient documentation

## 2014-07-08 DIAGNOSIS — D649 Anemia, unspecified: Secondary | ICD-10-CM | POA: Diagnosis not present

## 2014-07-08 DIAGNOSIS — I255 Ischemic cardiomyopathy: Secondary | ICD-10-CM | POA: Insufficient documentation

## 2014-07-08 DIAGNOSIS — I517 Cardiomegaly: Secondary | ICD-10-CM | POA: Insufficient documentation

## 2014-07-08 DIAGNOSIS — M1990 Unspecified osteoarthritis, unspecified site: Secondary | ICD-10-CM | POA: Insufficient documentation

## 2014-07-08 DIAGNOSIS — I10 Essential (primary) hypertension: Secondary | ICD-10-CM | POA: Diagnosis not present

## 2014-07-08 DIAGNOSIS — I351 Nonrheumatic aortic (valve) insufficiency: Secondary | ICD-10-CM | POA: Diagnosis not present

## 2014-07-08 DIAGNOSIS — R51 Headache: Secondary | ICD-10-CM | POA: Diagnosis not present

## 2014-07-08 DIAGNOSIS — E785 Hyperlipidemia, unspecified: Secondary | ICD-10-CM | POA: Diagnosis not present

## 2014-07-08 DIAGNOSIS — I340 Nonrheumatic mitral (valve) insufficiency: Secondary | ICD-10-CM | POA: Diagnosis not present

## 2014-07-08 DIAGNOSIS — I6523 Occlusion and stenosis of bilateral carotid arteries: Secondary | ICD-10-CM | POA: Diagnosis not present

## 2014-07-08 DIAGNOSIS — I252 Old myocardial infarction: Secondary | ICD-10-CM | POA: Insufficient documentation

## 2014-07-08 DIAGNOSIS — Z951 Presence of aortocoronary bypass graft: Secondary | ICD-10-CM | POA: Diagnosis not present

## 2014-07-08 DIAGNOSIS — E119 Type 2 diabetes mellitus without complications: Secondary | ICD-10-CM | POA: Diagnosis not present

## 2014-07-08 DIAGNOSIS — I2510 Atherosclerotic heart disease of native coronary artery without angina pectoris: Secondary | ICD-10-CM | POA: Insufficient documentation

## 2014-07-08 HISTORY — DX: Acute myocardial infarction, unspecified: I21.9

## 2014-07-08 HISTORY — DX: Frequency of micturition: R35.0

## 2014-07-08 HISTORY — DX: Depression, unspecified: F32.A

## 2014-07-08 HISTORY — DX: Gastro-esophageal reflux disease without esophagitis: K21.9

## 2014-07-08 HISTORY — DX: Major depressive disorder, single episode, unspecified: F32.9

## 2014-07-08 LAB — BASIC METABOLIC PANEL
Anion gap: 14 (ref 5–15)
BUN: 20 mg/dL (ref 6–23)
CO2: 21 mEq/L (ref 19–32)
Calcium: 8.9 mg/dL (ref 8.4–10.5)
Chloride: 94 mEq/L — ABNORMAL LOW (ref 96–112)
Creatinine, Ser: 0.77 mg/dL (ref 0.50–1.10)
GFR calc Af Amer: 87 mL/min — ABNORMAL LOW (ref >90)
GFR calc non Af Amer: 75 mL/min — ABNORMAL LOW (ref >90)
Glucose, Bld: 92 mg/dL (ref 70–99)
Potassium: 7.1 mEq/L (ref 3.7–5.3)
Sodium: 129 mEq/L — ABNORMAL LOW (ref 137–147)

## 2014-07-08 LAB — CBC
HCT: 37.6 % (ref 36.0–46.0)
Hemoglobin: 13.2 g/dL (ref 12.0–15.0)
MCH: 30.1 pg (ref 26.0–34.0)
MCHC: 35.1 g/dL (ref 30.0–36.0)
MCV: 85.6 fL (ref 78.0–100.0)
Platelets: 347 10*3/uL (ref 150–400)
RBC: 4.39 MIL/uL (ref 3.87–5.11)
RDW: 13.3 % (ref 11.5–15.5)
WBC: 8.4 10*3/uL (ref 4.0–10.5)

## 2014-07-08 LAB — SURGICAL PCR SCREEN
MRSA, PCR: NEGATIVE
Staphylococcus aureus: NEGATIVE

## 2014-07-08 NOTE — Progress Notes (Signed)
Critical lab value called by lab, potassium  7.1. Specimen was hemolyzed per lab.  Called Dr. Lacy Duverney office notified. Information given to Susanne Greenhouse., states she will get the message to Dr. Christella Noa.

## 2014-07-08 NOTE — Progress Notes (Signed)
Requested EKG and ov from  Dr. Mel Almond

## 2014-07-08 NOTE — Pre-Procedure Instructions (Signed)
[place]  07/08/2014   Your procedure is scheduled on:  07-14-2014    Wednesday    Report to [facility] Admitting at 7:30 AM.   Call this number if you have problems the morning of surgery: [phone]   Remember:   Do not eat food or drink liquids after midnight.    Take these medicines the morning of surgery with A SIP OF WATER:pain medication if needed,metoprolol(Lopressor),omeprazole(Prilosec),              Stop all supplements fish oil and NSAIDS(aleve,ibuprofen,goody's powders) five days prior to surgery    Do not wear jewelry, make-up or nail polish.  Do not wear lotions, powders, or perfumes. You may not wear deodorant.  Do not shave 48 hours prior to surgery. Men may shave face and neck.   Do not bring valuables to the hospital.  [facility] is not responsible for any belongings or valuables.               Contacts, dentures or bridgework may not be worn into surgery.   Leave suitcase in the car. After surgery it may be brought to your room.   For patients admitted to the hospital, discharge time is determined by your treatment team.               .    Special Instructions: See attached sheet for instructions of CHG  Shower/bath   Please read over the following fact sheets that you were given: Pain Booklet, Coughing and Deep Breathing and Surgical Site Infection Prevention

## 2014-07-09 NOTE — Progress Notes (Signed)
Anesthesia Chart Review:  Patient is a 80 year old female scheduled for L2-3 decompression on 07/14/14 by Dr. Christella Noa.  History includes CAD/NSTEMI s/p PTCA 03/2007 and s/p CABG (LIMA to the LAD, SVG to both branches of diagonal, SVG to obtuse marginal, SVG to PDA) 04/2007, ischemic CM (EF 40% in 03/2007, improved to 60% in 11/2008), HTN, DM2, dyslipidemia, anemia, headaches, arthritis, tonsillectomy, appendectomy, non-smoker, L3-5 laminectomies 05/01/13 PCP is listed as Dr. Rochel Brome.  Meds include ASA 81 mg, furosemide, lisinopril, metoprolol, omeprazole, sertraline.  Her cardiologist is Dr. Percival Spanish, last visit on 08/15/13. She had no new CV symptoms, so he felt no further cardiac studies were indicated at that time.One year follow-up recommended (scheduled for 08/05/14).  EKG on 12/31/13 ([facility]) showed SB at 50 bpm, anterolateral non-specific ST/T wave abnormality.  EKG appears stable when compared to tracing form 08/15/13.  Echo on 11/25/08: - Left ventricular ejection fraction was estimated to be 60 %. There was hypokinesis of the septal wall. There was mild focal basal septal hypertrophy. Doppler parameters were consistent with high left ventricular filling pressure. - The aortic valve was mildly calcified. There was trivial aortic valvular regurgitation. - There was mild mitral annular calcification. There was mild mitral valvular regurgitation. - The left atrium was mildly dilated. - The estimated peak right ventricular systolic pressure was mild to moderately increased. - There was mild to moderate tricuspid valvular regurgitation.  No cath or stress noted since her 2008 CABG.  Carotid duplex 08/26/13: Mild heterogenous plaque noted, bilaterally.  Stable 40-59% bilateral ICA stenosis. Moderate right SCA stenosis. Patent vertebral arteries with antegrade flow.  Preoperative labs noted.  Na 129, K 7.1 (specimen "marked hemolyis" which may affect result), Cr 0.77, BUN 20.  CBC  WNL. Critical K+ results were already called to the surgeon's office.  If Dr. Christella Noa did not have labs rechecked before her surgery date then she will need an ISTAT4 on arrival to recheck Na and K.  Cardiac history reviewed with anesthesiologist Dr. Marcie Bal.  Patient has had cardiology follow-up within the past year and tolerated surgery last September.  If no new CV symptoms and follow-up labs are acceptable then it is anticipated that she can proceed as planned.    [facility]/Anesthesiology Phone [phone] 07/09/2014 12:51 PM

## 2014-07-13 MED ORDER — VANCOMYCIN HCL IN DEXTROSE 1-5 GM/200ML-% IV SOLN
1000.0000 mg | INTRAVENOUS | Status: AC
  Administered 2014-07-14: 1000 mg via INTRAVENOUS

## 2014-07-14 ENCOUNTER — Encounter (HOSPITAL_COMMUNITY): Payer: Self-pay | Admitting: *Deleted

## 2014-07-14 ENCOUNTER — Inpatient Hospital Stay (HOSPITAL_COMMUNITY): Payer: Medicare Other | Admitting: Anesthesiology

## 2014-07-14 ENCOUNTER — Inpatient Hospital Stay (HOSPITAL_COMMUNITY)
Admission: RE | Admit: 2014-07-14 | Discharge: 2014-07-16 | DRG: 552 | Disposition: A | Discharge status: Home Health | Payer: Medicare Other | Source: Ambulatory Visit | Attending: Neurosurgery | Admitting: Neurosurgery

## 2014-07-14 ENCOUNTER — Encounter (HOSPITAL_COMMUNITY)
Admission: RE | Disposition: A | Discharge status: Home Health | Payer: Self-pay | Source: Ambulatory Visit | Attending: Neurosurgery

## 2014-07-14 ENCOUNTER — Inpatient Hospital Stay (HOSPITAL_COMMUNITY): Payer: Medicare Other | Admitting: Vascular Surgery

## 2014-07-14 ENCOUNTER — Inpatient Hospital Stay (HOSPITAL_COMMUNITY): Payer: Medicare Other

## 2014-07-14 DIAGNOSIS — Z79899 Other long term (current) drug therapy: Secondary | ICD-10-CM

## 2014-07-14 DIAGNOSIS — M48061 Spinal stenosis, lumbar region without neurogenic claudication: Secondary | ICD-10-CM | POA: Diagnosis present

## 2014-07-14 DIAGNOSIS — G9519 Other vascular myelopathies: Secondary | ICD-10-CM | POA: Diagnosis present

## 2014-07-14 DIAGNOSIS — Z880 Allergy status to penicillin: Secondary | ICD-10-CM

## 2014-07-14 DIAGNOSIS — I252 Old myocardial infarction: Secondary | ICD-10-CM

## 2014-07-14 DIAGNOSIS — Z7982 Long term (current) use of aspirin: Secondary | ICD-10-CM

## 2014-07-14 DIAGNOSIS — E119 Type 2 diabetes mellitus without complications: Secondary | ICD-10-CM | POA: Diagnosis present

## 2014-07-14 DIAGNOSIS — M545 Low back pain: Secondary | ICD-10-CM | POA: Diagnosis present

## 2014-07-14 DIAGNOSIS — I10 Essential (primary) hypertension: Secondary | ICD-10-CM | POA: Diagnosis present

## 2014-07-14 DIAGNOSIS — M4806 Spinal stenosis, lumbar region: Principal | ICD-10-CM | POA: Diagnosis present

## 2014-07-14 HISTORY — PX: LUMBAR LAMINECTOMY/DECOMPRESSION MICRODISCECTOMY: SHX5026

## 2014-07-14 LAB — GLUCOSE, CAPILLARY
Glucose-Capillary: 110 mg/dL — ABNORMAL HIGH (ref 70–99)
Glucose-Capillary: 112 mg/dL — ABNORMAL HIGH (ref 70–99)
Glucose-Capillary: 109 mg/dL — ABNORMAL HIGH (ref 70–99)
Glucose-Capillary: 117 mg/dL — ABNORMAL HIGH (ref 70–99)

## 2014-07-14 LAB — POCT I-STAT 4, (NA,K, GLUC, HGB,HCT)
Glucose, Bld: 100 mg/dL — ABNORMAL HIGH (ref 70–99)
HCT: 39 % (ref 36.0–46.0)
Hemoglobin: 13.3 g/dL (ref 12.0–15.0)
Potassium: 4.5 meq/L (ref 3.7–5.3)
Sodium: 137 meq/L (ref 137–147)

## 2014-07-14 SURGERY — LUMBAR LAMINECTOMY/DECOMPRESSION MICRODISCECTOMY 1 LEVEL
Anesthesia: General | Site: Spine Lumbar

## 2014-07-14 MED ORDER — CEFAZOLIN SODIUM-DEXTROSE 2-3 GM-% IV SOLR
INTRAVENOUS | Status: AC
  Filled 2014-07-14: qty 50

## 2014-07-14 MED ORDER — HYDROMORPHONE HCL 1 MG/ML IJ SOLN
INTRAMUSCULAR | Status: AC
  Filled 2014-07-14: qty 1

## 2014-07-14 MED ORDER — KETOROLAC TROMETHAMINE 30 MG/ML IJ SOLN
30.0000 mg | Freq: Four times a day (QID) | INTRAMUSCULAR | Status: DC
  Administered 2014-07-15 – 2014-07-16 (×7): 30 mg via INTRAVENOUS
  Filled 2014-07-14 (×7): qty 1

## 2014-07-14 MED ORDER — 0.9 % SODIUM CHLORIDE (POUR BTL) OPTIME
TOPICAL | Status: DC | PRN
  Administered 2014-07-14: 1000 mL

## 2014-07-14 MED ORDER — HYDROCODONE-ACETAMINOPHEN 5-325 MG PO TABS
1.0000 | ORAL_TABLET | ORAL | Status: DC | PRN
  Administered 2014-07-14: 1 via ORAL
  Administered 2014-07-14: 2 via ORAL
  Administered 2014-07-15: 1 via ORAL
  Administered 2014-07-16: 2 via ORAL
  Filled 2014-07-14: qty 1
  Filled 2014-07-14 (×3): qty 2

## 2014-07-14 MED ORDER — ROCURONIUM BROMIDE 50 MG/5ML IV SOLN
INTRAVENOUS | Status: AC
  Filled 2014-07-14: qty 1

## 2014-07-14 MED ORDER — ROCURONIUM BROMIDE 100 MG/10ML IV SOLN
INTRAVENOUS | Status: DC | PRN
  Administered 2014-07-14: 30 mg via INTRAVENOUS

## 2014-07-14 MED ORDER — NEOSTIGMINE METHYLSULFATE 10 MG/10ML IV SOLN
INTRAVENOUS | Status: DC | PRN
  Administered 2014-07-14: 2.5 mg via INTRAVENOUS

## 2014-07-14 MED ORDER — ONDANSETRON HCL 4 MG/2ML IJ SOLN
INTRAMUSCULAR | Status: DC | PRN
  Administered 2014-07-14: 4 mg via INTRAVENOUS

## 2014-07-14 MED ORDER — THROMBIN 5000 UNITS EX SOLR
CUTANEOUS | Status: DC | PRN
  Administered 2014-07-14 (×2): 5000 [IU] via TOPICAL

## 2014-07-14 MED ORDER — GLYCOPYRROLATE 0.2 MG/ML IJ SOLN
INTRAMUSCULAR | Status: AC
  Filled 2014-07-14: qty 2

## 2014-07-14 MED ORDER — EPHEDRINE SULFATE 50 MG/ML IJ SOLN
INTRAMUSCULAR | Status: DC | PRN
  Administered 2014-07-14 (×3): 10 mg via INTRAVENOUS

## 2014-07-14 MED ORDER — LISINOPRIL 10 MG PO TABS
10.0000 mg | ORAL_TABLET | Freq: Every day | ORAL | Status: DC
  Administered 2014-07-14 – 2014-07-16 (×3): 10 mg via ORAL
  Filled 2014-07-14 (×3): qty 1

## 2014-07-14 MED ORDER — LIDOCAINE-EPINEPHRINE 0.5 %-1:200000 IJ SOLN
INTRAMUSCULAR | Status: DC | PRN
  Administered 2014-07-14: 20 mL
  Administered 2014-07-14: 10 mL

## 2014-07-14 MED ORDER — LIDOCAINE HCL (CARDIAC) 20 MG/ML IV SOLN
INTRAVENOUS | Status: AC
  Filled 2014-07-14: qty 5

## 2014-07-14 MED ORDER — VANCOMYCIN HCL IN DEXTROSE 1-5 GM/200ML-% IV SOLN
INTRAVENOUS | Status: AC
  Filled 2014-07-14: qty 200

## 2014-07-14 MED ORDER — ACETAMINOPHEN 650 MG RE SUPP: 650.0000 mg | RECTAL | Status: DC | PRN

## 2014-07-14 MED ORDER — INSULIN ASPART 100 UNIT/ML ~~LOC~~ SOLN: 0.0000 [IU] | Freq: Three times a day (TID) | SUBCUTANEOUS | Status: DC

## 2014-07-14 MED ORDER — PROPOFOL 10 MG/ML IV BOLUS
INTRAVENOUS | Status: AC
  Filled 2014-07-14: qty 20

## 2014-07-14 MED ORDER — GLYCOPYRROLATE 0.2 MG/ML IJ SOLN
INTRAMUSCULAR | Status: AC
  Filled 2014-07-14: qty 1

## 2014-07-14 MED ORDER — MORPHINE SULFATE 2 MG/ML IJ SOLN: 1.0000 mg | INTRAMUSCULAR | Status: DC | PRN

## 2014-07-14 MED ORDER — EPHEDRINE SULFATE 50 MG/ML IJ SOLN
INTRAMUSCULAR | Status: AC
  Filled 2014-07-14: qty 1

## 2014-07-14 MED ORDER — INSULIN ASPART 100 UNIT/ML ~~LOC~~ SOLN: 0.0000 [IU] | Freq: Every day | SUBCUTANEOUS | Status: DC

## 2014-07-14 MED ORDER — FENTANYL CITRATE 0.05 MG/ML IJ SOLN
INTRAMUSCULAR | Status: AC
  Filled 2014-07-14: qty 5

## 2014-07-14 MED ORDER — ONDANSETRON HCL 4 MG/2ML IJ SOLN
INTRAMUSCULAR | Status: AC
  Filled 2014-07-14: qty 2

## 2014-07-14 MED ORDER — HEMOSTATIC AGENTS (NO CHARGE) OPTIME
TOPICAL | Status: DC | PRN
  Administered 2014-07-14: 1 via TOPICAL

## 2014-07-14 MED ORDER — GLYCOPYRROLATE 0.2 MG/ML IJ SOLN
INTRAMUSCULAR | Status: DC | PRN
  Administered 2014-07-14: 0.1 mg via INTRAVENOUS
  Administered 2014-07-14: 0.4 mg via INTRAVENOUS

## 2014-07-14 MED ORDER — SENNA 8.6 MG PO TABS
1.0000 | ORAL_TABLET | Freq: Two times a day (BID) | ORAL | Status: DC
  Administered 2014-07-14 – 2014-07-15 (×3): 8.6 mg via ORAL
  Filled 2014-07-14 (×3): qty 1

## 2014-07-14 MED ORDER — METOPROLOL TARTRATE 25 MG PO TABS
25.0000 mg | ORAL_TABLET | Freq: Two times a day (BID) | ORAL | Status: DC
  Administered 2014-07-14 – 2014-07-16 (×3): 25 mg via ORAL
  Filled 2014-07-14 (×3): qty 1

## 2014-07-14 MED ORDER — ASPIRIN 81 MG PO CHEW
81.0000 mg | CHEWABLE_TABLET | Freq: Every day | ORAL | Status: DC
  Administered 2014-07-14 – 2014-07-16 (×3): 81 mg via ORAL
  Filled 2014-07-14 (×3): qty 1

## 2014-07-14 MED ORDER — OXYCODONE HCL 5 MG PO TABS: 5.0000 mg | ORAL_TABLET | Freq: Once | ORAL | Status: DC | PRN

## 2014-07-14 MED ORDER — ZOLPIDEM TARTRATE 5 MG PO TABS: 5.0000 mg | ORAL_TABLET | Freq: Every evening | ORAL | Status: DC | PRN

## 2014-07-14 MED ORDER — LORAZEPAM 1 MG PO TABS
1.0000 mg | ORAL_TABLET | Freq: Every day | ORAL | Status: DC
  Administered 2014-07-14: 1 mg via ORAL
  Filled 2014-07-14 (×2): qty 1

## 2014-07-14 MED ORDER — PROPOFOL 10 MG/ML IV BOLUS
INTRAVENOUS | Status: DC | PRN
  Administered 2014-07-14: 130 mg via INTRAVENOUS

## 2014-07-14 MED ORDER — PANTOPRAZOLE SODIUM 40 MG PO TBEC
40.0000 mg | DELAYED_RELEASE_TABLET | Freq: Every day | ORAL | Status: DC
  Administered 2014-07-14 – 2014-07-16 (×3): 40 mg via ORAL
  Filled 2014-07-14 (×3): qty 1

## 2014-07-14 MED ORDER — OXYCODONE-ACETAMINOPHEN 5-325 MG PO TABS: 1.0000 | ORAL_TABLET | ORAL | Status: DC | PRN

## 2014-07-14 MED ORDER — CYCLOBENZAPRINE HCL 10 MG PO TABS: 10.0000 mg | ORAL_TABLET | Freq: Three times a day (TID) | ORAL | Status: DC | PRN

## 2014-07-14 MED ORDER — HYDROMORPHONE HCL 1 MG/ML IJ SOLN
0.2500 mg | INTRAMUSCULAR | Status: DC | PRN
  Administered 2014-07-14: 0.5 mg via INTRAVENOUS

## 2014-07-14 MED ORDER — ONDANSETRON HCL 4 MG/2ML IJ SOLN: 4.0000 mg | Freq: Once | INTRAMUSCULAR | Status: DC | PRN

## 2014-07-14 MED ORDER — FUROSEMIDE 20 MG PO TABS: 20.0000 mg | ORAL_TABLET | Freq: Two times a day (BID) | ORAL | Status: DC | PRN

## 2014-07-14 MED ORDER — SODIUM CHLORIDE 0.9 % IJ SOLN
INTRAMUSCULAR | Status: AC
  Filled 2014-07-14: qty 10

## 2014-07-14 MED ORDER — MENTHOL 3 MG MT LOZG: 1.0000 | LOZENGE | OROMUCOSAL | Status: DC | PRN

## 2014-07-14 MED ORDER — SERTRALINE HCL 100 MG PO TABS
100.0000 mg | ORAL_TABLET | Freq: Every day | ORAL | Status: DC
  Administered 2014-07-14 – 2014-07-15 (×2): 100 mg via ORAL
  Filled 2014-07-14 (×2): qty 1

## 2014-07-14 MED ORDER — PHENYLEPHRINE HCL 10 MG/ML IJ SOLN
10.0000 mg | INTRAMUSCULAR | Status: DC | PRN
  Administered 2014-07-14: 50 ug/min via INTRAVENOUS

## 2014-07-14 MED ORDER — SODIUM CHLORIDE 0.9 % IJ SOLN
3.0000 mL | Freq: Two times a day (BID) | INTRAMUSCULAR | Status: DC
  Administered 2014-07-15: 3 mL via INTRAVENOUS

## 2014-07-14 MED ORDER — ONDANSETRON HCL 4 MG/2ML IJ SOLN: 4.0000 mg | INTRAMUSCULAR | Status: DC | PRN

## 2014-07-14 MED ORDER — FENTANYL CITRATE 0.05 MG/ML IJ SOLN
INTRAMUSCULAR | Status: DC | PRN
  Administered 2014-07-14: 50 ug via INTRAVENOUS
  Administered 2014-07-14: 100 ug via INTRAVENOUS

## 2014-07-14 MED ORDER — LACTATED RINGERS IV SOLN
INTRAVENOUS | Status: DC
  Administered 2014-07-14: 08:00:00 via INTRAVENOUS

## 2014-07-14 MED ORDER — SODIUM CHLORIDE 0.9 % IV SOLN: 250.0000 mL | INTRAVENOUS | Status: DC

## 2014-07-14 MED ORDER — PHENOL 1.4 % MT LIQD: 1.0000 | OROMUCOSAL | Status: DC | PRN

## 2014-07-14 MED ORDER — LIDOCAINE HCL (CARDIAC) 20 MG/ML IV SOLN
INTRAVENOUS | Status: DC | PRN
  Administered 2014-07-14: 40 mg via INTRAVENOUS

## 2014-07-14 MED ORDER — OXYCODONE HCL 5 MG/5ML PO SOLN: 5.0000 mg | Freq: Once | ORAL | Status: DC | PRN

## 2014-07-14 MED ORDER — POTASSIUM CHLORIDE IN NACL 20-0.9 MEQ/L-% IV SOLN
INTRAVENOUS | Status: DC
  Administered 2014-07-14 – 2014-07-15 (×2): via INTRAVENOUS
  Filled 2014-07-14 (×3): qty 1000

## 2014-07-14 MED ORDER — ASPIRIN 81 MG PO TABS: 81.0000 mg | ORAL_TABLET | Freq: Every day | ORAL | Status: DC

## 2014-07-14 MED ORDER — ACETAMINOPHEN 325 MG PO TABS: 650.0000 mg | ORAL_TABLET | ORAL | Status: DC | PRN

## 2014-07-14 MED ORDER — SODIUM CHLORIDE 0.9 % IJ SOLN: 3.0000 mL | INTRAMUSCULAR | Status: DC | PRN

## 2014-07-14 MED ORDER — NEOSTIGMINE METHYLSULFATE 10 MG/10ML IV SOLN
INTRAVENOUS | Status: AC
  Filled 2014-07-14: qty 1

## 2014-07-14 SURGICAL SUPPLY — 57 items
APL SKNCLS STERI-STRIP NONHPOA (GAUZE/BANDAGES/DRESSINGS)
BAG DECANTER FOR FLEXI CONT (MISCELLANEOUS) ×1 IMPLANT
BENZOIN TINCTURE PRP APPL 2/3 (GAUZE/BANDAGES/DRESSINGS) IMPLANT
BLADE CLIPPER SURG (BLADE) IMPLANT
BUR MATCHSTICK NEURO 3.0 LAGG (BURR) ×3 IMPLANT
CANISTER SUCT 3000ML (MISCELLANEOUS) ×3 IMPLANT
CLOSURE WOUND 1/2 X4 (GAUZE/BANDAGES/DRESSINGS)
CONT SPEC 4OZ CLIKSEAL STRL BL (MISCELLANEOUS) ×3 IMPLANT
DECANTER SPIKE VIAL GLASS SM (MISCELLANEOUS) ×3 IMPLANT
DRAPE LAPAROTOMY 100X72X124 (DRAPES) ×3 IMPLANT
DRAPE MICROSCOPE LEICA (MISCELLANEOUS) ×3 IMPLANT
DRAPE POUCH INSTRU U-SHP 10X18 (DRAPES) ×3 IMPLANT
DRAPE SURG 17X23 STRL (DRAPES) ×3 IMPLANT
DURAPREP 26ML APPLICATOR (WOUND CARE) ×3 IMPLANT
ELECT REM PT RETURN 9FT ADLT (ELECTROSURGICAL) ×3
ELECTRODE REM PT RTRN 9FT ADLT (ELECTROSURGICAL) ×1 IMPLANT
GAUZE SPONGE 4X4 12PLY STRL (GAUZE/BANDAGES/DRESSINGS) IMPLANT
GAUZE SPONGE 4X4 16PLY XRAY LF (GAUZE/BANDAGES/DRESSINGS) IMPLANT
GLOVE BIO SURGEON STRL SZ8 (GLOVE) ×3 IMPLANT
GLOVE BIOGEL PI IND STRL 7.0 (GLOVE) ×1 IMPLANT
GLOVE BIOGEL PI IND STRL 7.5 (GLOVE) ×1 IMPLANT
GLOVE BIOGEL PI IND STRL 8 (GLOVE) ×3 IMPLANT
GLOVE BIOGEL PI INDICATOR 7.0 (GLOVE) ×2
GLOVE BIOGEL PI INDICATOR 7.5 (GLOVE) ×2
GLOVE BIOGEL PI INDICATOR 8 (GLOVE) ×6
GLOVE ECLIPSE 6.5 STRL STRAW (GLOVE) ×3 IMPLANT
GLOVE ECLIPSE 7.5 STRL STRAW (GLOVE) ×21 IMPLANT
GLOVE EXAM NITRILE LRG STRL (GLOVE) IMPLANT
GLOVE EXAM NITRILE MD LF STRL (GLOVE) IMPLANT
GLOVE EXAM NITRILE XL STR (GLOVE) IMPLANT
GLOVE EXAM NITRILE XS STR PU (GLOVE) IMPLANT
GLOVE INDICATOR 8.5 STRL (GLOVE) ×2 IMPLANT
GOWN STRL REUS W/ TWL LRG LVL3 (GOWN DISPOSABLE) ×1 IMPLANT
GOWN STRL REUS W/ TWL XL LVL3 (GOWN DISPOSABLE) IMPLANT
GOWN STRL REUS W/TWL 2XL LVL3 (GOWN DISPOSABLE) ×6 IMPLANT
GOWN STRL REUS W/TWL LRG LVL3 (GOWN DISPOSABLE) ×3
GOWN STRL REUS W/TWL XL LVL3 (GOWN DISPOSABLE) ×6
KIT BASIN OR (CUSTOM PROCEDURE TRAY) ×3 IMPLANT
KIT ROOM TURNOVER OR (KITS) ×3 IMPLANT
LIQUID BAND (GAUZE/BANDAGES/DRESSINGS) ×3 IMPLANT
NEEDLE HYPO 25X1 1.5 SAFETY (NEEDLE) ×3 IMPLANT
NEEDLE SPNL 18GX3.5 QUINCKE PK (NEEDLE) ×3 IMPLANT
NS IRRIG 1000ML POUR BTL (IV SOLUTION) ×3 IMPLANT
PACK LAMINECTOMY NEURO (CUSTOM PROCEDURE TRAY) ×3 IMPLANT
PAD ARMBOARD 7.5X6 YLW CONV (MISCELLANEOUS) ×13 IMPLANT
RUBBERBAND STERILE (MISCELLANEOUS) ×6 IMPLANT
SPONGE LAP 4X18 X RAY DECT (DISPOSABLE) IMPLANT
SPONGE SURGIFOAM ABS GEL SZ50 (HEMOSTASIS) ×3 IMPLANT
STRIP CLOSURE SKIN 1/2X4 (GAUZE/BANDAGES/DRESSINGS) IMPLANT
SUT VIC AB 0 CT1 18XCR BRD8 (SUTURE) ×1 IMPLANT
SUT VIC AB 0 CT1 8-18 (SUTURE) ×3
SUT VIC AB 2-0 CT1 18 (SUTURE) ×3 IMPLANT
SUT VIC AB 3-0 SH 8-18 (SUTURE) ×5 IMPLANT
SYR 20ML ECCENTRIC (SYRINGE) ×3 IMPLANT
TOWEL OR 17X24 6PK STRL BLUE (TOWEL DISPOSABLE) ×3 IMPLANT
TOWEL OR 17X26 10 PK STRL BLUE (TOWEL DISPOSABLE) ×3 IMPLANT
WATER STERILE IRR 1000ML POUR (IV SOLUTION) ×3 IMPLANT

## 2014-07-14 NOTE — Anesthesia Postprocedure Evaluation (Signed)
  Anesthesia Post-op Note  Patient: [name]  Procedure(s) Performed: Procedure(s): LUMBAR LAMINECTOMY/DECOMPRESSION MICRODISCECTOMY 1 LEVEL LUMBAR TWO-THREE (N/A)  Patient Location: PACU  Anesthesia Type:General  Level of Consciousness: awake, alert  and oriented  Airway and Oxygen Therapy: Patient Spontanous Breathing and Patient connected to nasal cannula oxygen  Post-op Pain: mild  Post-op Assessment: Post-op Vital signs reviewed, Patient's Cardiovascular Status Stable, Respiratory Function Stable and Patent Airway  Post-op Vital Signs: stable  Last Vitals:  Filed Vitals:   07/14/14 1443  BP:   Pulse: 51  Temp:   Resp: 15    Complications: No apparent anesthesia complications

## 2014-07-14 NOTE — Progress Notes (Signed)
Pt is admitted to 4N24 from PACU

## 2014-07-14 NOTE — Progress Notes (Signed)
Found patient to have hole in hemovac tubing. Unmeasured amount of blood was removed from hemovac. Moderate amount on sheets and pt. Tubing was repaired. Pt was cleaned and sheets were changed. Will continue to monitor.

## 2014-07-14 NOTE — Progress Notes (Signed)
Report given to [place] rn

## 2014-07-14 NOTE — H&P (Signed)
  BP 166/52 mmHg  Pulse 56  Temp(Src) 97 F (36.1 C) (Oral)  Resp 20  Wt 69.4 kg (153 lb)  SpO2 98% [name] is well known to me having undergone a lumbar laminectomy for stenosis in 2014. She has been doing very well until approximately a month ago. She started to have significant pain in the lower back and posterior thigh in the right side. She has pain also radiating in between the shoulder blades, but mainly on the left side. She said that the right hand will often go asleep and has been noticeable loss over just last month. She has had no bowel or bladder dysfunction. She does not necessarily feel weak, but she does feel pain if she is working in and around the house. She feel pain if she has to stand or walk for a prolonged period of time. There was no antecedent trauma and she really is not aware of anything that may have cause this with regards to the pain around the shoulder, she thinks that is more than likely from her spine because she says it just feels that way. [name] is 64, right-handed, currently retired. On the pain chart, she only lists pain again along the right shoulder and in the right lower extremity. PAST MEDICAL HISTORY:  Current Medical Conditions: Significant for hypertension, myocardial infarction, diabetes. She feels weak all over.  Medications and Allergies: SHE HAS AN ALLERGY TO PENICILLIN. Medications, fish oil, aspirin, metoprolol, lorazepam, Lasix, omeprazole, sertraline, lisinopril. SOCIAL HISTORY: She does not smoke. She does not use alcohol. She does not use illicit drugs. PHYSICAL EXAMINATION: She is 5 feet 3.75 inches, weight 156.2 pounds, blood pressure 143/78, pulse 57, respiratory rate 14, temperature 98.1 Fahrenheit. Pain is 6/10. On exam, she has reflexes which are elicited at the knees and ankles bilaterally. No weakness. No pain with Patrick's maneuver of the right or left side. She does have a markedly positive Tinel sign in the right wrist. Direct  palpation of the left shoulder does not heal a painful area. She has normal reflexes in the upper extremities and normal sensation. HOPI: [name] returns today. She states that she's just in enough pain that she doesn't think she can do nothing. Given that and given the MRI findings which today show progression of the stenosis at L2-3 which is one level above the decompression that she had in July of 2014. I think she would do well. I think the compression is greatest on the right side where she is having most of her pain. I do not believe she needs to be fused. She has had no progression of a slip or any other degenerative change at 3-4 or 4-5 where she was previously operated.

## 2014-07-14 NOTE — Anesthesia Procedure Notes (Signed)
Procedure Name: Intubation Date/Time: 07/14/2014 10:40 AM Performed by: Kyung Rudd Pre-anesthesia Checklist: Patient identified, Emergency Drugs available, Suction available, Patient being monitored and Timeout performed Patient Re-evaluated:Patient Re-evaluated prior to inductionOxygen Delivery Method: Circle system utilized Preoxygenation: Pre-oxygenation with 100% oxygen Intubation Type: IV induction Ventilation: Mask ventilation without difficulty and Oral airway inserted - appropriate to patient size Laryngoscope Size: Mac and 3 Grade View: Grade I Tube type: Oral Tube size: 7.0 mm Number of attempts: 1 Airway Equipment and Method: Stylet Placement Confirmation: ETT inserted through vocal cords under direct vision,  positive ETCO2 and breath sounds checked- equal and bilateral Secured at: 21 cm Tube secured with: Tape Dental Injury: Teeth and Oropharynx as per pre-operative assessment

## 2014-07-14 NOTE — Anesthesia Preprocedure Evaluation (Addendum)
Anesthesia Evaluation  Patient identified by MRN, date of birth, ID band Patient awake    Reviewed: Allergy & Precautions, H&P , NPO status , Patient's Chart, lab work & pertinent test results, reviewed documented beta blocker date and time   Airway Mallampati: II  TM Distance: >3 FB Neck ROM: Full    Dental  (+) Teeth Intact, Dental Advisory Given   Pulmonary  breath sounds clear to auscultation        Cardiovascular hypertension, Rhythm:Regular     Neuro/Psych    GI/Hepatic   Endo/Other  diabetes  Renal/GU      Musculoskeletal   Abdominal   Peds  Hematology   Anesthesia Other Findings   Reproductive/Obstetrics                            Anesthesia Physical Anesthesia Plan  ASA: II  Anesthesia Plan: General   Post-op Pain Management:    Induction: Intravenous  Airway Management Planned: Oral ETT  Additional Equipment:   Intra-op Plan:   Post-operative Plan: Extubation in OR  Informed Consent: I have reviewed the patients History and Physical, chart, labs and discussed the procedure including the risks, benefits and alternatives for the proposed anesthesia with the patient or authorized representative who has indicated his/her understanding and acceptance.   Dental advisory given  Plan Discussed with: CRNA and Anesthesiologist  Anesthesia Plan Comments: (Lumbar spinal stenosis CAD S/PCABG (LIMA to the LAD, SVG to both branches of diagonal, SVG to obtuse marginal, SVG to PDA) 04/2007, no angina EF 60% in 11/2008 by echo  Hypertension Type 2 DM diet controlled glucose 100  Plan GA with oral ETT  Roberts Gaudy)        Anesthesia Quick Evaluation

## 2014-07-14 NOTE — Transfer of Care (Signed)
Immediate Anesthesia Transfer of Care Note  Patient: [name]  Procedure(s) Performed: Procedure(s): LUMBAR LAMINECTOMY/DECOMPRESSION MICRODISCECTOMY 1 LEVEL LUMBAR TWO-THREE (N/A)  Patient Location: PACU  Anesthesia Type:General  Level of Consciousness: awake, alert  and oriented  Airway & Oxygen Therapy: Patient Spontanous Breathing and Patient connected to face mask oxygen  Post-op Assessment: Report given to PACU RN, Post -op Vital signs reviewed and stable and Patient moving all extremities  Post vital signs: Reviewed and stable  Complications: No apparent anesthesia complications

## 2014-07-14 NOTE — Progress Notes (Signed)
Transported per [place] and Sydell Axon, NT

## 2014-07-14 NOTE — Progress Notes (Signed)
Pt. Didn't take beta blocker this morning. States she thought it would upset her stomach. Checked pulse 56, beta blocker not given per protocol.

## 2014-07-14 NOTE — Op Note (Signed)
07/14/2014  12:39 PM  PATIENT:  [name]  80 y.o. female with neurogenic claudication due to stenosis at L2/3  PRE-OPERATIVE DIAGNOSIS:  lumbar stenosis L2/3  POST-OPERATIVE DIAGNOSIS:  lumbar stenosis L2/3, neurogenic claudication  PROCEDURE:  Procedure(s): LUMBAR LAMINECTOMY/DECOMPRESSION MICRODISCECTOMY 1 LEVEL LUMBAR TWO-THREE  SURGEON:Iram Astorino L  ASSISTANTS:Cram, Dominica Severin  ANESTHESIA:   general  EBL:  Total I/O In: 500 [I.V.:500] Out: 50 [Blood:50]  BLOOD ADMINISTERED:none  CELL SAVER GIVEN:no  COUNT:per nursing  DRAINS: none   SPECIMEN:  No Specimen  DICTATION: [name] ws brought to the operating room intubated and placed under a general anesthetic without difficulty. She was positioned prone on a Wilson frame, with all pressure points padded. Her back was prepped and draped in a sterile manner. I infiltrated 10 cc lidocaine into the planned incision. I started the incision just rostral to her old incision, and incorporated the rostral portion of the old incision. I took the incision down to the thoracolumbar fascia with a 10 blade. I exposed the lamina of L1, and L2. I confirmed my location with an intraoperative xray.  I then exposed the right L3 lamina using the monopolar cautery. I completed semihemilaminectomies of L2 bilaterally with the drill, and the Kerrison punches.I removed the ligamentum flavum, decompressing the canal. I decompressed laterally using the Kerrison punches over the disc spaces at L2/3, freeing to some extent the L2 roots. Dr. Saintclair Halsted assisted with the decompression and closure. The central canal wAS decompressed and the lateral bone. I irrigated then closed the wound approximating the thoracolumbar fascia, and the subcutaneous, and subcuticular planes with vicryl suture. I used dermabond for a sterile dressing.   PLAN OF CARE: Admit to inpatient   PATIENT DISPOSITION:  PACU - hemodynamically stable.   Delay start of Pharmacological VTE  agent (>24hrs) due to surgical blood loss or risk of bleeding:  yes

## 2014-07-15 LAB — GLUCOSE, CAPILLARY
Glucose-Capillary: 85 mg/dL (ref 70–99)
Glucose-Capillary: 108 mg/dL — ABNORMAL HIGH (ref 70–99)
Glucose-Capillary: 100 mg/dL — ABNORMAL HIGH (ref 70–99)
Glucose-Capillary: 135 mg/dL — ABNORMAL HIGH (ref 70–99)

## 2014-07-15 LAB — HEMOGLOBIN A1C
Hgb A1c MFr Bld: 6.1 % — ABNORMAL HIGH (ref <5.7)
Mean Plasma Glucose: 128 mg/dL — ABNORMAL HIGH (ref <117)

## 2014-07-15 MED ORDER — POLYETHYLENE GLYCOL 3350 17 G PO PACK
17.0000 g | PACK | Freq: Every day | ORAL | Status: DC
  Administered 2014-07-15: 17 g via ORAL
  Filled 2014-07-15: qty 1

## 2014-07-15 NOTE — Progress Notes (Signed)
Occupational Therapy Evaluation Patient Details Name: [name] MRN: [id number] DOB: 08/28/1926 Today's Date: 07/15/2014    History of Present Illness Adm 07/14/14 for L2-3 laminectomy and microdiscectomy. PMHx- 04/2013 one level laminectomy, CAD, HTN, cardiomyopathy, OA, DM, Rt shoulder arthroscopy, CABG   Clinical Impression   Patient is s/p above surgery resulting in functional limitations due to the deficits listed below (see OT problem list). Pt unable to verbalize any back precautions, but is demonstrating good adherence to them in during functional activities. Pt educated and practiced with AE for LB adls. Pt is moving very well without an assistive device with no overt LOB during session today. Patient will benefit from skilled OT acutely to increase independence and safety with ADLS to allow discharge home.     Follow Up Recommendations  No OT follow up;Supervision/Assistance - 24 hour    Equipment Recommendations  Other (comment) (sock aide, long-handled sponge)    Recommendations for Other Services       Precautions / Restrictions Precautions Precautions: Back Precaution Comments: Pt unable to verbalize any back precautions at beginning of session Restrictions Weight Bearing Restrictions: No      Mobility Bed Mobility Overal bed mobility: Needs Assistance Bed Mobility: Rolling;Sidelying to Sit;Sit to Sidelying Rolling: Supervision Sidelying to sit: Supervision     Sit to sidelying: Supervision General bed mobility comments: HOB flat; use of bed rails to simulate home environment  Transfers Overall transfer level: Needs assistance Equipment used: None Transfers: Sit to/from Stand Sit to Stand: Min guard         General transfer comment: x4 with no overt LOB or reports of dizziness. Pt demonstrating good adherence to back precautions    Balance Overall balance assessment: Needs assistance Sitting-balance support: No upper extremity supported;Feet  supported Sitting balance-Leahy Scale: Good     Standing balance support: No upper extremity supported;During functional activity Standing balance-Leahy Scale: Good                              ADL Overall ADL's : Needs assistance/impaired     Grooming: Oral care;Wash/dry hands;Min guard;Cueing for compensatory techniques;Standing               Lower Body Dressing: Supervision/safety;Cueing for compensatory techniques;Adhering to back precautions;Sitting/lateral leans   Toilet Transfer: Min guard;Ambulation;Regular Toilet;RW Toilet Transfer Details (indicate cue type and reason): Min guard (A) for safety/balance Toileting- Clothing Manipulation and Hygiene: Min guard;Adhering to back precautions;Sit to/from stand       Functional mobility during ADLs: Min guard General ADL Comments: Pt completed toilet transfer and sink level ADLs at min guard (A) level for safety/balance. Pt had no overt LOB during session. Pt educated and practiced with AE for LB ADLs.      Vision                     Perception     Praxis      Pertinent Vitals/Pain Pain Assessment: 0-10 Pain Score: 2  Pain Location: low back, RLE Pain Descriptors / Indicators: Sore;Shooting Pain Intervention(s): Monitored during session;Repositioned     Hand Dominance     Extremity/Trunk Assessment Upper Extremity Assessment Upper Extremity Assessment: Overall WFL for tasks assessed   Lower Extremity Assessment Lower Extremity Assessment: Defer to PT evaluation   Cervical / Trunk Assessment Cervical / Trunk Assessment: Normal   Communication Communication Communication: HOH   Cognition Arousal/Alertness: Awake/alert Behavior During Therapy: WFL for tasks  assessed/performed Overall Cognitive Status: Within Functional Limits for tasks assessed       Memory: Decreased recall of precautions             General Comments       Exercises       Shoulder Instructions       Home Living Family/patient expects to be discharged to:: Private residence Living Arrangements: Spouse/significant other                 Bathroom Shower/Tub: Walk-in shower;Curtain   Bathroom Toilet: Handicapped height     Home Equipment: Other (comment) (reacher)          Prior Functioning/Environment               OT Diagnosis: Generalized weakness;Acute pain   OT Problem List: Decreased activity tolerance;Decreased coordination;Decreased safety awareness;Decreased knowledge of use of DME or AE;Decreased knowledge of precautions;Pain   OT Treatment/Interventions: Self-care/ADL training;Therapeutic exercise;DME and/or AE instruction;Therapeutic activities;Patient/family education;Balance training    OT Goals(Current goals can be found in the care plan section) Acute Rehab OT Goals Patient Stated Goal: to be able to pick up great great grandkids again OT Goal Formulation: With patient Time For Goal Achievement: 07/29/14 Potential to Achieve Goals: Good  OT Frequency: Min 2X/week   Barriers to D/C:            Co-evaluation              End of Session Equipment Utilized During Treatment: Gait belt Nurse Communication: Mobility status;Precautions  Activity Tolerance: Patient tolerated treatment well Patient left: in bed;with call bell/phone within reach;with bed alarm set;with family/visitor present   Time: 0092-3300 OT Time Calculation (min): 32 min Charges:    G-Codes:    Redmond Baseman 30-Jul-2014, 3:33 PM

## 2014-07-15 NOTE — Progress Notes (Signed)
Patient ID: [name], female   DOB: 1926-11-28, 80 y.o.   MRN: [id number] BP 199/58 mmHg  Pulse 62  Temp(Src) 97.5 F (36.4 C) (Oral)  Resp 20  Wt 69.4 kg (153 lb)  SpO2 93% Alert and oriented x 4, speech is clear and fluent Moving lower extremities well Worked with pt today Wound is clean, dry, without signs of infection.

## 2014-07-15 NOTE — Evaluation (Signed)
Physical Therapy Evaluation Patient Details Name: [name] MRN: [id number] DOB: 1927-07-22 Today's Date: 07/15/2014   History of Present Illness  Adm 07/14/14 for L2-3 laminectomy and microdiscectomy. PMHx- 04/2013 one level laminectomy, CAD, HTN, cardiomyopathy, OA, DM, Rt shoulder arthroscopy, CABG  Clinical Impression  Patient is s/p above surgery resulting in the deficits listed below (see PT Problem List). Pt is mobilizing very well. Required instructional cues to maintain back precautions during mobility. Patient will benefit from skilled PT to increase their independence and safety with mobility (while adhering to their precautions) to allow discharge       Follow Up Recommendations No PT follow up;Supervision for mobility/OOB    Equipment Recommendations  None recommended by PT    Recommendations for Other Services OT consult     Precautions / Restrictions Precautions Precautions: Back Precaution Booklet Issued: Yes (comment) Precaution Comments: pt unable to state precautions prior to session; required vc for no twisting during session; able to describe 3/3 precautions by end of session Required Braces or Orthoses:  (no brace)      Mobility  Bed Mobility Overal bed mobility: Needs Assistance Bed Mobility: Rolling;Sidelying to Sit Rolling: Supervision Sidelying to sit: Min assist       General bed mobility comments: using rail as she has one attached to her bed at home; vc for technique to maintain precautions; assist to come to sit due to pain  Transfers Overall transfer level: Needs assistance Equipment used: None Transfers: Sit to/from Stand Sit to Stand: Min guard         General transfer comment: x 2; no loss of balance or dizziness  Ambulation/Gait Ambulation/Gait assistance: Min guard Ambulation Distance (Feet): 200 Feet Assistive device: None Gait Pattern/deviations: Step-through pattern;Decreased stride length     General Gait Details:  slight drift left and right, however no overt loss of balance (seemed worse when turning head to talk to therapist)  Stairs            Wheelchair Mobility    Modified Rankin (Stroke Patients Only)       Balance                                             Pertinent Vitals/Pain Pain Assessment: 0-10 Pain Score: 1  Pain Location: ow back Pain Descriptors / Indicators: Sore Pain Intervention(s): Limited activity within patient's tolerance;Monitored during session;Repositioned    Home Living Family/patient expects to be discharged to:: Private residence Living Arrangements: Spouse/significant other Available Help at Discharge: Family;Available 24 hours/day Type of Home: House Home Access: Stairs to enter Entrance Stairs-Rails: Right Entrance Stairs-Number of Steps: 3 Home Layout: One level Home Equipment: Cane - single point;Bedside commode;Wheelchair - manual (bed rail, lift chair) Additional Comments: most equipment was her brother's (now deceased)    Prior Function Level of Independence: Independent         Comments: pt reports she did not use a RW after prior back surgery     Hand Dominance        Extremity/Trunk Assessment   Upper Extremity Assessment: Defer to OT evaluation           Lower Extremity Assessment: Overall WFL for tasks assessed      Cervical / Trunk Assessment: Normal  Communication   Communication: HOH  Cognition Arousal/Alertness: Awake/alert Behavior During Therapy: WFL for tasks assessed/performed Overall Cognitive Status: Within  Functional Limits for tasks assessed                      General Comments General comments (skin integrity, edema, etc.): husband present. Pt reports MD was not planning on her using a brace post-op (did not use one after previous surgery either)    Exercises        Assessment/Plan    PT Assessment Patient needs continued PT services  PT Diagnosis Difficulty  walking   PT Problem List Decreased mobility;Decreased knowledge of precautions;Pain  PT Treatment Interventions Gait training;Stair training;Functional mobility training;Therapeutic activities;Patient/family education   PT Goals (Current goals can be found in the Care Plan section) Acute Rehab PT Goals Patient Stated Goal: go home ASAP PT Goal Formulation: With patient Time For Goal Achievement: 07/16/14 Potential to Achieve Goals: Good    Frequency Min 5X/week   Barriers to discharge        Co-evaluation               End of Session Equipment Utilized During Treatment: Gait belt Activity Tolerance: Patient tolerated treatment well Patient left: in chair;with call bell/phone within reach;with chair alarm set;with family/visitor present;with nursing/sitter in room Nurse Communication: Mobility status         Time: 1019-1040 PT Time Calculation (min) (ACUTE ONLY): 21 min   Charges:   PT Evaluation $Initial PT Evaluation Tier I: 1 Procedure PT Treatments $Therapeutic Activity: 8-22 mins   PT G Codes:          Deantae Shackleton July 19, 2014, 10:54 AM Pager [phone]

## 2014-07-15 NOTE — Progress Notes (Signed)
CARE MANAGEMENT NOTE 07/15/2014  Patient:  [name], [name]   Account Number:  [id number]  Date Initiated:  07/15/2014  Documentation initiated by:  Olga Coaster  Subjective/Objective Assessment:   ADMITTED FOR SURGERY     Action/Plan:   CM FOLLOWING FOR DCP   Anticipated DC Date:  07/19/2014   Anticipated DC Plan:  AWAITING FOR PT/OT EVALS FOR DISPOSITION NEEDS;     DC Planning Services  CM consult          Status of service:  In process, will continue to follow Medicare Important Message given?   (If response is "NO", the following Medicare IM given date fields will be blank)  Per UR Regulation:  Reviewed for med. necessity/level of care/duration of stay  Comments:  11/19/2015Mindi Slicker RN,BSN,MHA [phone]

## 2014-07-16 ENCOUNTER — Encounter (HOSPITAL_COMMUNITY): Payer: Self-pay | Admitting: Neurosurgery

## 2014-07-16 LAB — GLUCOSE, CAPILLARY
Glucose-Capillary: 84 mg/dL (ref 70–99)
Glucose-Capillary: 90 mg/dL (ref 70–99)

## 2014-07-16 MED ORDER — CYCLOBENZAPRINE HCL 7.5 MG PO TABS: 7.5000 mg | ORAL_TABLET | Freq: Three times a day (TID) | ORAL | Status: DC | PRN

## 2014-07-16 MED ORDER — HYDROCODONE-ACETAMINOPHEN 5-325 MG PO TABS: 1.0000 | ORAL_TABLET | Freq: Four times a day (QID) | ORAL | Status: DC | PRN

## 2014-07-16 NOTE — Progress Notes (Signed)
Occupational Therapy Treatment Patient Details Name: [name] MRN: [id number] DOB: 30-Sep-1926 Today's Date: 07/16/2014    History of present illness Adm 07/14/14 for L2-3 laminectomy and microdiscectomy. PMHx- 04/2013 one level laminectomy, CAD, HTN, cardiomyopathy, OA, DM, Rt shoulder arthroscopy, CABG   OT comments  Pt progressing very well with mobility. Pt verbalized 2/3 back precautions, but demonstrating good understanding and adherence. Pt completed dynamic balance activities today with minimal LOB during horizontal head turns. Discussed fall prevention strategies during functional activities and for home environment. Feel pt is safe to d/c. All education has been provided to pt and husband and they have no further questions. OT to sign off.   Follow Up Recommendations  No OT follow up;Supervision/Assistance - 24 hour    Equipment Recommendations  Other (comment) (sock aide, long-handled sponge)    Recommendations for Other Services      Precautions / Restrictions Precautions Precautions: Back Precaution Comments: Pt verbalized 2/3 precautions and no lifting >5 lbs precaution. Pt demonstrating good understanding and adherence to back precautions Restrictions Weight Bearing Restrictions: No       Mobility Bed Mobility Overal bed mobility: Needs Assistance Bed Mobility: Rolling;Sidelying to Sit;Sit to Sidelying Rolling: Min guard Sidelying to sit: Min guard     Sit to sidelying: Supervision General bed mobility comments: HOB flat, no use of bedrails.   Transfers Overall transfer level: Needs assistance Equipment used: None Transfers: Sit to/from Stand Sit to Stand: Supervision         General transfer comment: x2 with no overt LOB or reports of dizziness. Pt demonstrating good adherence to back precautions    Balance Overall balance assessment: Needs assistance Sitting-balance support: No upper extremity supported;Feet supported       Standing  balance support: No upper extremity supported;During functional activity Standing balance-Leahy Scale: Good                     ADL Overall ADL's : Needs assistance/impaired     Grooming: Wash/dry hands;Supervision/safety;Standing                   Toilet Transfer: Supervision/safety;Ambulation;Regular Toilet   Toileting- Water quality scientist and Hygiene: Supervision/safety;Sitting/lateral lean   Tub/ Banker: Min guard;Ambulation   Functional mobility during ADLs: Supervision/safety General ADL Comments: Pt completed toilet transfer and sink level ADLs at supervision level. PT completed laundry folding task with no LOB and adhering to back precautions. Pt ambulated 300' in hallway and completed DGI with some LOB with horizontal head turns. Discussed strategies to prevent falls based on performance on DGI. Pt/husband educated on ways to decr fall risk at home. Pt anxious to drive and be able to pick up/play with grandkids. Discussed importance of following back precautions for ~12 weeks.        Vision                     Perception     Praxis      Cognition   Behavior During Therapy: [facility] for tasks assessed/performed Overall Cognitive Status: Within Functional Limits for tasks assessed       Memory: Decreased recall of precautions               Extremity/Trunk Assessment               Exercises     Shoulder Instructions       General Comments      Pertinent Vitals/ Pain  Pain Assessment: 0-10 Pain Score: 5  Pain Location: Lower abdomen, upper thighs Pain Descriptors / Indicators: Sore Pain Intervention(s): Monitored during session;Repositioned  Home Living                                          Prior Functioning/Environment              Frequency       Progress Toward Goals  OT Goals(current goals can now be found in the care plan section)  Progress towards OT goals: Goals  met/education completed, patient discharged from OT  Acute Rehab OT Goals Patient Stated Goal: to be able to pick up great great grandkids again OT Goal Formulation: With patient Time For Goal Achievement: 07/29/14 Potential to Achieve Goals: Good ADL Goals Pt Will Perform Lower Body Bathing: with modified independence;with adaptive equipment;sit to/from stand Pt Will Perform Tub/Shower Transfer: Shower transfer;ambulating;shower seat;rolling walker Additional ADL Goal #1: Pt will verbalize 3/3 back precautions with 100% accuracy before discharge.  Plan All goals met and education completed, patient discharged from OT services    Co-evaluation                 End of Session Equipment Utilized During Treatment: Gait belt   Activity Tolerance Patient tolerated treatment well   Patient Left in bed;with call bell/phone within reach;with bed alarm set;with family/visitor present   Nurse Communication Mobility status;Precautions        Time: 5597-4163 OT Time Calculation (min): 20 min  Charges:    Redmond Baseman 07/16/2014, 1:15 PM

## 2014-07-16 NOTE — Discharge Summary (Signed)
  Physician Discharge Summary  Patient ID: [name] MRN: [id number] DOB/AGE: May 21, 1927 80 y.o.  Admit date: 07/14/2014 Discharge date: 07/16/2014  Admission Diagnoses:Lumbar canal stenosis  Discharge Diagnoses:  Active Problems:   Lumbar canal stenosis   Discharged Condition: good  Hospital Course: [name] was taken to the operating room and underwent an uncomplicated lumbar laminectomy for stenosis. Post op she has been walking, voiding, and tolerating a regular diet.  Her wound is clean, dry, and without signs of infection.   Treatments: surgery: LUMBAR LAMINECTOMY/DECOMPRESSION MICRODISCECTOMY 1 LEVEL LUMBAR TWO-THREE   Discharge Exam: Blood pressure 151/51, pulse 49, temperature 98.6 F (37 C), temperature source Oral, resp. rate 20, weight 69.4 kg (153 lb), SpO2 95 %. General appearance: alert, cooperative and appears stated age Neurologic: Alert and oriented X 3, normal strength and tone. Normal symmetric reflexes. Normal coordination and gait  Disposition: 03-Skilled Nursing Facility lumbar stenosis    Medication List    TAKE these medications        ALEVE 220 MG tablet  Generic drug:  naproxen sodium  Take 440 mg by mouth 2 (two) times daily as needed (for pain).     aspirin 81 MG tablet  Take 81 mg by mouth daily.     cyclobenzaprine 7.5 MG tablet  Commonly known as:  FEXMID  Take 1 tablet (7.5 mg total) by mouth 3 (three) times daily as needed for muscle spasms.     fish oil-omega-3 fatty acids 1000 MG capsule  Take 1 g by mouth 2 (two) times daily.     furosemide 20 MG tablet  Commonly known as:  LASIX  Take 20 mg by mouth 2 (two) times daily as needed for fluid.     HYDROcodone-acetaminophen 5-325 MG per tablet  Commonly known as:  NORCO/VICODIN  Take 1 tablet by mouth every 6 (six) hours as needed for pain.     HYDROcodone-acetaminophen 5-325 MG per tablet  Commonly known as:  NORCO/VICODIN  Take 1 tablet by mouth every 6 (six) hours  as needed for moderate pain.     lisinopril 10 MG tablet  Commonly known as:  PRINIVIL,ZESTRIL  Take 10 mg by mouth daily.     LORazepam 1 MG tablet  Commonly known as:  ATIVAN  Take 1 mg by mouth at bedtime.     metoprolol tartrate 25 MG tablet  Commonly known as:  LOPRESSOR  Take 25 mg by mouth 2 (two) times daily.     omeprazole 20 MG capsule  Commonly known as:  PRILOSEC  Take 20 mg by mouth daily.     ONE TOUCH ULTRA TEST test strip  Generic drug:  glucose blood     ONETOUCH DELICA LANCETS Misc     sertraline 100 MG tablet  Commonly known as:  ZOLOFT  Take 100 mg by mouth at bedtime.           Follow-up Information    Follow up with Dyanna Seiter L, MD In 3 weeks.   Specialty:  Neurosurgery   Why:  call office to make an appointment   Contact information:   [address] [place] [address] [phone]       Signed: Kirstina Leinweber L 07/16/2014, 2:08 PM

## 2014-07-16 NOTE — Progress Notes (Signed)
Discharge orders received. Pt for discharge home today. IV d/c'd. Dressing clean, dry, intact to lower back. Pt given discharge instructions and prescriptions with verbalized understanding. Family in room to assist with discharge. Staff brought pt downstairs via wheelchair.

## 2014-07-16 NOTE — Discharge Instructions (Signed)
Laminectomy - Laminotomy - Discectomy  Your surgeon has decided that a laminectomy (entire lamina removal) or laminotomy (partial lamina removal) is the best treatment for your back problem. These procedures involve removal of bone to relieve pressure on nerve roots. It allows the surgeon access to parts of the spine where other problems are located. This could be an injured disc (the cartilage-like structures located between the bones of the back). In this surgery your surgeon removes a part of the boney arch that surrounds your spinal canal. This may be compressing nerve roots. In some cases, the surgeon will remove the disc and fuse (stick together) vertebral bodies (the bones of your back) to make the spine more stable. The type of procedure you will need is usually decided prior to surgery, however modifications may be necessary. The time in surgery depends on the findings in surgery and the procedure necessary to correct the problems.  DISCECTOMY  For people with disc problems, the surgeon removes the portion of the disc that is causing the pressure on the nerve root. Some surgeons perform a micro (small) discectomy, which may require removal of only a small portion of the lamina. A disc nucleus (center) may also be removed either through a needle (percutaneous discectomy) or by injecting an enzyme called chymopapain into the disc. Chymopapain is an enzyme that dissolves the disc. For people with back instability, the surgeon fuses vertebrae that are next to each other with tiny pieces of bone. These are used as bone grafts on the facets, or between the vertebrae. When this heals, the bones will no longer be able to move. These bone chips are often taken from the pelvic bones. Bones and bone grafts grow into one unit, stabilizing the segments of the spinal column.  LET YOUR CAREGIVER KNOW ABOUT:  · Allergies.  · Medicines taken including herbs, eyedrops, over-the-counter medicines, and creams.  · Use of  steroids (by mouth or creams).  · Previous problems with anesthetics or numbing medicine.  · Possibility of pregnancy, if this applies.  · History of blood clots (thrombophlebitis).  · History of bleeding or blood problems.  · Previous surgery.  · Other health problems.  RISKS AND COMPLICATIONS  Your caregiver will discuss possible risks and complications with you before surgery. In addition to the usual risks of anesthesia, other common risks and complications include:  · Blood loss and replacement.  · Temporary increase in pain due to surgery.  · Uncorrected back pain.  · Infection.  · New nerve damage (tingling, numbness, and pain).  BEFORE THE PROCEDURE  · Stop smoking at least 1 week prior to surgery. This lowers risk during surgery.  · Your caregiver may advise that you stop taking certain medicines that may affect the outcome of the surgery and your ability to heal. For example, you may need to stop taking anti-inflammatories, such as aspirin, because of possible bleeding problems. Other medicines may have interactions with anesthesia.  · Tell your caregiver if you have been on steroids for long periods of time. Often, additional steroids are administered intravenously before and during the procedure to prevent complications.  · You should be present 60 minutes prior to your procedure or as directed.  AFTER THE PROCEDURE  After surgery, you will be taken to the recovery area where a nurse will watch and check your progress. Generally, you will be allowed to go home within 1 week barring other problems.  HOME CARE INSTRUCTIONS   · Check the surgical cut (  incision) twice a day for signs of infection. Some signs may include a bad smelling, greenish or yellowish discharge from the wound; increased pain or increased redness over the incision site; an opening of the incision; flu-like symptoms; or a temperature above 101.5° F (38.6° C).  · Change your bandages in about 24 to 36 hours following surgery or as  directed.  · You may shower once the bandage is removed or as directed. Avoid bathtubs, swimming pools, and hot tubs for 3 weeks or until your incision has healed completely. If you have stitches (sutures) or staples they may be removed 2 to 3 weeks after surgery, or as directed by your caregiver.  · Follow your caregiver's instructions for activities, exercises, and physical therapy.  · Weight reduction may be beneficial if you are overweight.  · Walking is permitted. You may use a treadmill without an incline. Cut down on activities if you have discomfort. You may also go up and down stairs as tolerated.  · Do not lift anything heavier than 10 to 15 pounds. Avoid bending or twisting at the waist. Always bend your knees.  · Maintain strength and range of motion as instructed.  · No driving is permitted for 2 to 3 weeks, or as directed by your caregiver. You may be a passenger for 20 to 30 minute trips. Lying back in the passenger seat may be more comfortable for you.  · Limit your sitting to 20 to 30 minute intervals. You should lie down or walk in between sitting periods. There are no limitations for sitting in a recliner chair.  · Only take over-the-counter or prescription medicines for pain, discomfort, or fever as directed by your caregiver.  SEEK MEDICAL CARE IF:   · There is increased bleeding (more than a small spot) from the wound.  · You notice redness, swelling, or increasing pain in the wound.  · Pus is coming from the wound.  · An unexplained oral temperature above 102° F (38.9° C) develops.  · You notice a bad smell coming from the wound or dressing.  SEEK IMMEDIATE MEDICAL CARE IF:   · You develop a rash.  · You have difficulty breathing.  · You have any allergic problems.  Document Released: 08/10/2000 Document Revised: 12/28/2013 Document Reviewed: 06/08/2008  Exit[employer]® Patient Information ©2015 [employer]. This information is not intended to replace advice given to you by your health care  provider. Make sure you discuss any questions you have with your health care provider.

## 2014-08-05 ENCOUNTER — Ambulatory Visit (INDEPENDENT_AMBULATORY_CARE_PROVIDER_SITE_OTHER): Payer: Medicare Other | Admitting: Cardiology

## 2014-08-05 ENCOUNTER — Encounter: Payer: Self-pay | Admitting: Cardiology

## 2014-08-05 VITALS — BP 122/60 | HR 58 | Ht 64.0 in | Wt 148.0 lb

## 2014-08-05 DIAGNOSIS — I2510 Atherosclerotic heart disease of native coronary artery without angina pectoris: Secondary | ICD-10-CM

## 2014-08-05 DIAGNOSIS — I6523 Occlusion and stenosis of bilateral carotid arteries: Secondary | ICD-10-CM

## 2014-08-05 DIAGNOSIS — I10 Essential (primary) hypertension: Secondary | ICD-10-CM

## 2014-08-05 NOTE — Progress Notes (Signed)
HPI The patient presents for evaulation of known coronary disease. Since I last saw her she has done well from a cardiovascular standpoint.  She has had back surgery again.   She does have chronic dyspnea with exertion and is fatigued.  However, she does not have angina.  She denies any resting SOB.  She has no PND or orthopnea.  She has no neck or arm discomfort. There have been no reported palpitations, presyncope or syncope.   She is limited by her back pain.   Allergies  Allergen Reactions  . Atorvastatin     Muscle pain  . Penicillins   . Statins     MUSCLE PAIN  . Hydrocodone Rash    Current Outpatient Prescriptions  Medication Sig Dispense Refill  . amLODipine (NORVASC) 5 MG tablet Take 5 mg by mouth daily.    Marland Kitchen aspirin 81 MG tablet Take 81 mg by mouth daily.      . fish oil-omega-3 fatty acids 1000 MG capsule Take 1 g by mouth 2 (two) times daily.     . furosemide (LASIX) 20 MG tablet Take 20 mg by mouth 2 (two) times daily as needed for fluid.     Marland Kitchen lisinopril (PRINIVIL,ZESTRIL) 10 MG tablet Take 10 mg by mouth daily.    Marland Kitchen LORazepam (ATIVAN) 1 MG tablet Take 1 mg by mouth at bedtime.    . metoprolol tartrate (LOPRESSOR) 25 MG tablet Take 25 mg by mouth 2 (two) times daily.      . naproxen sodium (ALEVE) 220 MG tablet Take 440 mg by mouth 2 (two) times daily as needed (for pain).    Marland Kitchen omeprazole (PRILOSEC) 20 MG capsule Take 20 mg by mouth daily.      . ONE TOUCH ULTRA TEST test strip     . [place] LANCETS MISC     . predniSONE (DELTASONE) 20 MG tablet Take 20 mg by mouth daily.    . sertraline (ZOLOFT) 100 MG tablet Take 100 mg by mouth at bedtime.     No current facility-administered medications for this visit.    Past Medical History  Diagnosis Date  . CAD (coronary artery disease)   . HTN (hypertension)   . Dyslipidemia   . Cardiomyopathy, ischemic     EF 40% at the time cath  . Arthritis   . Anemia     hx  . Myocardial infarction     mild  .  Diabetes mellitus without complication     borderline no meds  . Depression   . Urinary frequency   . GERD (gastroesophageal reflux disease)     Past Surgical History  Procedure Laterality Date  . Coronary artery bypass graft      LIMA to the LAD, SVG to diagonal, SVG to obtuse marginal, SVG to PDA..  2008  . Shoulder arthroscopy w/ rotator cuff repair Right     04  . Appendectomy    . Tonsillectomy    . Cesarean section      x2  . Lumbar laminectomy/decompression microdiscectomy N/A 05/01/2013    Procedure: LUMBAR LAMINECTOMY/DECOMPRESSION MICRODISCECTOMY 2 LEVELS;  Surgeon: Winfield Cunas, MD;  Location: [place];  Service: Neurosurgery;  Laterality: N/A;  Lumbar Three-Four, Four-Five Laminectomies   . Lumbar laminectomy/decompression microdiscectomy N/A 07/14/2014    Procedure: LUMBAR LAMINECTOMY/DECOMPRESSION MICRODISCECTOMY 1 LEVEL LUMBAR TWO-THREE;  Surgeon: Ashok Pall, MD;  Location: [place];  Service: Neurosurgery;  Laterality: N/A;    ROS:  As stated  in the HPI and negative for all other systems.  PHYSICAL EXAM BP 122/60 mmHg  Pulse 58  Ht 5\' 4"  (1.626 m)  Wt 148 lb (67.132 kg)  BMI 25.39 kg/m2 GENERAL:  Well appearing HEENT:  Pupils equal round and reactive, fundi not visualized, oral mucosa unremarkable NECK:  No jugular venous distention, waveform within normal limits, carotid upstroke brisk and symmetric, soft bilateral bruits, no thyromegaly or LYMPHATICS:  No cervical, inguinal adenopathy LUNGS:  Clear to auscultation bilaterally BACK:  No CVA tenderness CHEST:  Well healed sternotomy scar. HEART:  PMI not displaced or sustained,S1 and S2 within normal limits, no S3, no S4, no clicks, no rubs, no murmurs ABD:  Flat, positive bowel sounds normal in frequency in pitch, no bruits, no rebound, no guarding, no midline pulsatile mass, no hepatomegaly, no splenomegaly EXT:  2 plus pulses throughout, no edema, no cyanosis no clubbing   EKG:  Sinus rhythm,  rate 58, axis within normal limits, intervals within normal limits, no acute ST-T wave changes.  IRBBB.   08/05/2014.   ASSESSMENT AND PLAN  CAD - She has no new symptoms. We will continue aggressive risk reduction. No further studies are indicated at this time.   HYPERTENSION - Blood pressure is well controlled. She will continue on the meds as listed.  HYPERLIPIDEMIA -  She is intolerant of statins.   I will defer management of her lipids to [place], MD  CAROTID STENOSIS-  She was notified to have a follow up but she has not yet agreed to have this.   FATIGUE -  If this does not improve as she gets out further from her last back surgery we could try stopping the beta blocker.

## 2014-08-05 NOTE — Patient Instructions (Signed)
Your physician recommends that you schedule a follow-up appointment in: one year with Dr.Hochrein with Dr. Percival Spanish  We are ordering an Echo for you to get done

## 2014-08-09 ENCOUNTER — Ambulatory Visit (HOSPITAL_COMMUNITY)
Admission: RE | Admit: 2014-08-09 | Discharge: 2014-08-09 | Disposition: A | Discharge status: Home / Self Care | Payer: Medicare Other | Source: Ambulatory Visit | Attending: Cardiology | Admitting: Cardiology

## 2014-08-09 DIAGNOSIS — I779 Disorder of arteries and arterioles, unspecified: Secondary | ICD-10-CM

## 2014-08-09 DIAGNOSIS — I672 Cerebral atherosclerosis: Secondary | ICD-10-CM | POA: Diagnosis present

## 2014-08-09 DIAGNOSIS — I6523 Occlusion and stenosis of bilateral carotid arteries: Secondary | ICD-10-CM | POA: Diagnosis not present

## 2014-08-09 NOTE — Progress Notes (Signed)
Carotid Duplex Completed. A mild to moderate amount of heterogeneous plaque visualized in both ICAs with less than 50% reduction. Oda Cogan, BS, RDMS, RVT

## 2014-10-08 DIAGNOSIS — E780 Pure hypercholesterolemia: Secondary | ICD-10-CM | POA: Diagnosis not present

## 2014-10-08 DIAGNOSIS — I10 Essential (primary) hypertension: Secondary | ICD-10-CM | POA: Diagnosis not present

## 2014-10-08 DIAGNOSIS — F320 Major depressive disorder, single episode, mild: Secondary | ICD-10-CM | POA: Diagnosis not present

## 2014-10-08 DIAGNOSIS — E119 Type 2 diabetes mellitus without complications: Secondary | ICD-10-CM | POA: Diagnosis not present

## 2014-10-08 DIAGNOSIS — M4806 Spinal stenosis, lumbar region: Secondary | ICD-10-CM | POA: Diagnosis not present

## 2015-01-14 DIAGNOSIS — E1121 Type 2 diabetes mellitus with diabetic nephropathy: Secondary | ICD-10-CM | POA: Diagnosis not present

## 2015-01-14 DIAGNOSIS — M25512 Pain in left shoulder: Secondary | ICD-10-CM | POA: Diagnosis not present

## 2015-01-14 DIAGNOSIS — E780 Pure hypercholesterolemia: Secondary | ICD-10-CM | POA: Diagnosis not present

## 2015-01-14 DIAGNOSIS — I10 Essential (primary) hypertension: Secondary | ICD-10-CM | POA: Diagnosis not present

## 2015-01-14 DIAGNOSIS — M4806 Spinal stenosis, lumbar region: Secondary | ICD-10-CM | POA: Diagnosis not present

## 2015-01-28 DIAGNOSIS — I10 Essential (primary) hypertension: Secondary | ICD-10-CM | POA: Diagnosis not present

## 2015-03-23 DIAGNOSIS — H40059 Ocular hypertension, unspecified eye: Secondary | ICD-10-CM | POA: Diagnosis not present

## 2015-03-23 DIAGNOSIS — H43393 Other vitreous opacities, bilateral: Secondary | ICD-10-CM | POA: Diagnosis not present

## 2015-03-23 DIAGNOSIS — H26499 Other secondary cataract, unspecified eye: Secondary | ICD-10-CM | POA: Diagnosis not present

## 2015-04-20 DIAGNOSIS — E780 Pure hypercholesterolemia: Secondary | ICD-10-CM | POA: Diagnosis not present

## 2015-04-20 DIAGNOSIS — M4806 Spinal stenosis, lumbar region: Secondary | ICD-10-CM | POA: Diagnosis not present

## 2015-04-20 DIAGNOSIS — I10 Essential (primary) hypertension: Secondary | ICD-10-CM | POA: Diagnosis not present

## 2015-04-20 DIAGNOSIS — E1121 Type 2 diabetes mellitus with diabetic nephropathy: Secondary | ICD-10-CM | POA: Diagnosis not present

## 2015-04-20 DIAGNOSIS — K219 Gastro-esophageal reflux disease without esophagitis: Secondary | ICD-10-CM | POA: Diagnosis not present

## 2015-04-22 ENCOUNTER — Encounter: Payer: Self-pay | Admitting: Cardiology

## 2015-04-26 ENCOUNTER — Telehealth: Payer: Self-pay | Admitting: Pharmacist Clinician (PhC)/ Clinical Pharmacy Specialist

## 2015-04-26 NOTE — Telephone Encounter (Signed)
-----   Message from Minus Breeding, MD sent at 04/21/2015  1:02 PM EDT ----- Her MD wants her to try a PCSK9.  Can you get her into a Lipid clinic appt when you have time?  Thanks.  Maylon Cos

## 2015-04-26 NOTE — Telephone Encounter (Signed)
LMOM for patient to call regarding PCSK-9 use.    Pt returned call, she will think about potentially using this class of medication.  Does not want to drive to [facility] and isn't sure if this is something she wants to tackle at the age of 79.  I gave her my contact information should she change her mind or have further questions.

## 2015-05-11 DIAGNOSIS — Z23 Encounter for immunization: Secondary | ICD-10-CM | POA: Diagnosis not present

## 2015-05-31 ENCOUNTER — Telehealth: Payer: Self-pay | Admitting: Cardiology

## 2015-05-31 NOTE — Telephone Encounter (Signed)
Received FMLA paperwork from [employer] on [name] for daughter [name].  Given to [employer] for Dr Hochrein's review and sign. lp

## 2015-06-15 ENCOUNTER — Telehealth: Payer: Self-pay | Admitting: Cardiology

## 2015-06-15 NOTE — Telephone Encounter (Signed)
Received signed FMLA [facility]) forms back from Dr Percival Spanish.  Called patient on 10/17 [facility].  Spoke with patient and faxed signed form to [facility] on 06/14/15. lp

## 2015-08-01 DIAGNOSIS — Z1211 Encounter for screening for malignant neoplasm of colon: Secondary | ICD-10-CM | POA: Diagnosis not present

## 2015-08-01 DIAGNOSIS — Z0000 Encounter for general adult medical examination without abnormal findings: Secondary | ICD-10-CM | POA: Diagnosis not present

## 2015-08-01 DIAGNOSIS — Z6827 Body mass index (BMI) 27.0-27.9, adult: Secondary | ICD-10-CM | POA: Diagnosis not present

## 2015-08-01 DIAGNOSIS — E119 Type 2 diabetes mellitus without complications: Secondary | ICD-10-CM | POA: Diagnosis not present

## 2015-08-01 DIAGNOSIS — R8299 Other abnormal findings in urine: Secondary | ICD-10-CM | POA: Diagnosis not present

## 2015-08-01 DIAGNOSIS — Z13820 Encounter for screening for osteoporosis: Secondary | ICD-10-CM | POA: Diagnosis not present

## 2015-08-01 DIAGNOSIS — E782 Mixed hyperlipidemia: Secondary | ICD-10-CM | POA: Diagnosis not present

## 2015-08-11 ENCOUNTER — Encounter: Payer: Self-pay | Admitting: Cardiology

## 2015-08-11 ENCOUNTER — Ambulatory Visit (INDEPENDENT_AMBULATORY_CARE_PROVIDER_SITE_OTHER): Payer: Medicare Other | Admitting: Cardiology

## 2015-08-11 VITALS — BP 118/62 | HR 57 | Ht 64.5 in | Wt 159.3 lb

## 2015-08-11 DIAGNOSIS — I10 Essential (primary) hypertension: Secondary | ICD-10-CM | POA: Diagnosis not present

## 2015-08-11 DIAGNOSIS — I2510 Atherosclerotic heart disease of native coronary artery without angina pectoris: Secondary | ICD-10-CM | POA: Diagnosis not present

## 2015-08-11 NOTE — Progress Notes (Signed)
HPI The patient presents for evaulation of known coronary disease.   Unfortunately her husband died this year.   She is grieving but seems to be doing relatively well.   She does have chronic dyspnea with exertion and is fatigued.  However, she does not have angina.  She denies any resting SOB.  She has no PND or orthopnea.  She has no neck or arm discomfort. There have been no reported palpitations, presyncope or syncope. She still has fatigue.   Allergies  Allergen Reactions  . Atorvastatin     Muscle pain  . Penicillins   . Statins     MUSCLE PAIN  . Hydrocodone Rash    Current Outpatient Prescriptions  Medication Sig Dispense Refill  . amLODipine (NORVASC) 5 MG tablet Take 5 mg by mouth daily.    Marland Kitchen aspirin 81 MG tablet Take 81 mg by mouth daily.      . fish oil-omega-3 fatty acids 1000 MG capsule Take 1 g by mouth 2 (two) times daily.     . furosemide (LASIX) 20 MG tablet Take 20 mg by mouth 2 (two) times daily as needed for fluid.     Marland Kitchen lisinopril (PRINIVIL,ZESTRIL) 10 MG tablet Take 10 mg by mouth daily.    Marland Kitchen LORazepam (ATIVAN) 1 MG tablet Take 1 mg by mouth at bedtime.    . metoprolol tartrate (LOPRESSOR) 25 MG tablet Take 25 mg by mouth 2 (two) times daily.      Marland Kitchen omeprazole (PRILOSEC) 20 MG capsule Take 20 mg by mouth daily.      . ONE TOUCH ULTRA TEST test strip     . [place] LANCETS MISC     . sertraline (ZOLOFT) 100 MG tablet Take 100 mg by mouth at bedtime.     No current facility-administered medications for this visit.    Past Medical History  Diagnosis Date  . CAD (coronary artery disease)   . HTN (hypertension)   . Dyslipidemia   . Cardiomyopathy, ischemic     EF 40% at the time cath  . Arthritis   . Anemia     hx  . Myocardial infarction (HCC)     mild  . Diabetes mellitus without complication     borderline no meds  . Depression   . Urinary frequency   . GERD (gastroesophageal reflux disease)     Past Surgical History  Procedure  Laterality Date  . Coronary artery bypass graft      LIMA to the LAD, SVG to diagonal, SVG to obtuse marginal, SVG to PDA..  2008  . Shoulder arthroscopy w/ rotator cuff repair Right     04  . Appendectomy    . Tonsillectomy    . Cesarean section      x2  . Lumbar laminectomy/decompression microdiscectomy N/A 05/01/2013    Procedure: LUMBAR LAMINECTOMY/DECOMPRESSION MICRODISCECTOMY 2 LEVELS;  Surgeon: Winfield Cunas, MD;  Location: [place] [facility];  Service: Neurosurgery;  Laterality: N/A;  Lumbar Three-Four, Four-Five Laminectomies   . Lumbar laminectomy/decompression microdiscectomy N/A 07/14/2014    Procedure: LUMBAR LAMINECTOMY/DECOMPRESSION MICRODISCECTOMY 1 LEVEL LUMBAR TWO-THREE;  Surgeon: Ashok Pall, MD;  Location: [place];  Service: Neurosurgery;  Laterality: N/A;    ROS:  As stated in the HPI and negative for all other systems.  PHYSICAL EXAM BP 118/62 mmHg  Pulse 57  Ht 5' 4.5" (1.638 m)  Wt 159 lb 4.8 oz (72.258 kg)  BMI 26.93 kg/m2 GENERAL:  Well appearing NECK:  No jugular venous distention, waveform within normal limits, carotid upstroke brisk and symmetric, soft bilateral bruits, no thyromegaly or LUNGS:  Clear to auscultation bilaterally BACK:  No CVA tenderness CHEST:  Well healed sternotomy scar. HEART:  PMI not displaced or sustained,S1 and S2 within normal limits, no S3, no S4, no clicks, no rubs, no murmurs ABD:  Flat, positive bowel sounds normal in frequency in pitch, no bruits, no rebound, no guarding, no midline pulsatile mass, no hepatomegaly, no splenomegaly EXT:  2 plus pulses throughout, no edema, no cyanosis no clubbing   EKG:  Sinus rhythm, rate 57, axis within normal limits, intervals within normal limits, no acute ST-T wave changes.  IRBBB. Anterior T wave inversion unchanged from previous.  08/11/2015.   ASSESSMENT AND PLAN  CAD - She has no new symptoms. We will continue aggressive risk reduction. No further studies are indicated at this  time.    HYPERTENSION - Blood pressure is well controlled. She will continue on the meds as listed.  HYPERLIPIDEMIA -  She is intolerant of statins.   I will defer management of her lipids to [place], MD   CAROTID STENOSIS-  She agrees to have repeat Doppler when I see her next year.   FATIGUE -  This is still ongoing.  I will stop the metoprolol.

## 2015-08-11 NOTE — Patient Instructions (Addendum)
Your physician wants you to follow-up in: 1 Year. You will receive a reminder letter in the mail two months in advance. If you don't receive a letter, please call our office to schedule the follow-up appointment.  Your physician has requested that you have a carotid duplex. This test is an ultrasound of the carotid arteries in your neck. It looks at blood flow through these arteries that supply the brain with blood. Allow one hour for this exam. There are no restrictions or special instructions. 1 Year.  Your physician has recommended you make the following change in your medication: STOP Metoprolol   Merry Christmas and [place]!!

## 2015-08-15 DIAGNOSIS — Z780 Asymptomatic menopausal state: Secondary | ICD-10-CM | POA: Diagnosis not present

## 2015-08-15 DIAGNOSIS — M8589 Other specified disorders of bone density and structure, multiple sites: Secondary | ICD-10-CM | POA: Diagnosis not present

## 2015-09-02 DIAGNOSIS — H40003 Preglaucoma, unspecified, bilateral: Secondary | ICD-10-CM | POA: Diagnosis not present

## 2015-09-08 DIAGNOSIS — H40013 Open angle with borderline findings, low risk, bilateral: Secondary | ICD-10-CM | POA: Diagnosis not present

## 2015-09-08 DIAGNOSIS — I10 Essential (primary) hypertension: Secondary | ICD-10-CM | POA: Diagnosis not present

## 2015-09-08 DIAGNOSIS — M8589 Other specified disorders of bone density and structure, multiple sites: Secondary | ICD-10-CM | POA: Diagnosis not present

## 2015-10-13 DIAGNOSIS — H40059 Ocular hypertension, unspecified eye: Secondary | ICD-10-CM | POA: Diagnosis not present

## 2015-10-13 DIAGNOSIS — H40003 Preglaucoma, unspecified, bilateral: Secondary | ICD-10-CM | POA: Diagnosis not present

## 2015-11-16 DIAGNOSIS — I110 Hypertensive heart disease with heart failure: Secondary | ICD-10-CM | POA: Diagnosis not present

## 2015-11-16 DIAGNOSIS — K219 Gastro-esophageal reflux disease without esophagitis: Secondary | ICD-10-CM | POA: Diagnosis not present

## 2015-11-16 DIAGNOSIS — E782 Mixed hyperlipidemia: Secondary | ICD-10-CM | POA: Diagnosis not present

## 2015-11-16 DIAGNOSIS — E119 Type 2 diabetes mellitus without complications: Secondary | ICD-10-CM | POA: Diagnosis not present

## 2015-11-16 DIAGNOSIS — E1121 Type 2 diabetes mellitus with diabetic nephropathy: Secondary | ICD-10-CM | POA: Diagnosis not present

## 2015-11-16 DIAGNOSIS — M4806 Spinal stenosis, lumbar region: Secondary | ICD-10-CM | POA: Diagnosis not present

## 2015-12-05 DIAGNOSIS — M5412 Radiculopathy, cervical region: Secondary | ICD-10-CM | POA: Diagnosis not present

## 2015-12-05 DIAGNOSIS — H66009 Acute suppurative otitis media without spontaneous rupture of ear drum, unspecified ear: Secondary | ICD-10-CM | POA: Diagnosis not present

## 2015-12-05 DIAGNOSIS — R531 Weakness: Secondary | ICD-10-CM | POA: Diagnosis not present

## 2015-12-05 DIAGNOSIS — R262 Difficulty in walking, not elsewhere classified: Secondary | ICD-10-CM | POA: Diagnosis not present

## 2015-12-05 DIAGNOSIS — R270 Ataxia, unspecified: Secondary | ICD-10-CM | POA: Diagnosis not present

## 2015-12-08 DIAGNOSIS — M5416 Radiculopathy, lumbar region: Secondary | ICD-10-CM | POA: Diagnosis not present

## 2015-12-08 DIAGNOSIS — R531 Weakness: Secondary | ICD-10-CM | POA: Diagnosis not present

## 2015-12-08 DIAGNOSIS — M542 Cervicalgia: Secondary | ICD-10-CM | POA: Diagnosis not present

## 2015-12-08 DIAGNOSIS — M545 Low back pain: Secondary | ICD-10-CM | POA: Diagnosis not present

## 2015-12-08 DIAGNOSIS — M129 Arthropathy, unspecified: Secondary | ICD-10-CM | POA: Diagnosis not present

## 2015-12-08 DIAGNOSIS — M5412 Radiculopathy, cervical region: Secondary | ICD-10-CM | POA: Diagnosis not present

## 2015-12-08 DIAGNOSIS — M62838 Other muscle spasm: Secondary | ICD-10-CM | POA: Diagnosis not present

## 2015-12-09 ENCOUNTER — Inpatient Hospital Stay (HOSPITAL_COMMUNITY)
Admission: EM | Admit: 2015-12-09 | Discharge: 2015-12-12 | DRG: 065 | Disposition: A | Discharge status: Skilled Nursing Facility | Payer: Medicare Other | Attending: Internal Medicine | Admitting: Internal Medicine

## 2015-12-09 ENCOUNTER — Emergency Department (HOSPITAL_COMMUNITY): Payer: Medicare Other

## 2015-12-09 ENCOUNTER — Encounter (HOSPITAL_COMMUNITY): Payer: Self-pay | Admitting: Emergency Medicine

## 2015-12-09 DIAGNOSIS — I25810 Atherosclerosis of coronary artery bypass graft(s) without angina pectoris: Secondary | ICD-10-CM | POA: Diagnosis not present

## 2015-12-09 DIAGNOSIS — I2510 Atherosclerotic heart disease of native coronary artery without angina pectoris: Secondary | ICD-10-CM | POA: Diagnosis present

## 2015-12-09 DIAGNOSIS — E1169 Type 2 diabetes mellitus with other specified complication: Secondary | ICD-10-CM

## 2015-12-09 DIAGNOSIS — I6789 Other cerebrovascular disease: Secondary | ICD-10-CM | POA: Diagnosis present

## 2015-12-09 DIAGNOSIS — R402252 Coma scale, best verbal response, oriented, at arrival to emergency department: Secondary | ICD-10-CM | POA: Diagnosis present

## 2015-12-09 DIAGNOSIS — E1140 Type 2 diabetes mellitus with diabetic neuropathy, unspecified: Secondary | ICD-10-CM | POA: Diagnosis present

## 2015-12-09 DIAGNOSIS — I130 Hypertensive heart and chronic kidney disease with heart failure and stage 1 through stage 4 chronic kidney disease, or unspecified chronic kidney disease: Secondary | ICD-10-CM | POA: Diagnosis present

## 2015-12-09 DIAGNOSIS — I252 Old myocardial infarction: Secondary | ICD-10-CM

## 2015-12-09 DIAGNOSIS — I69351 Hemiplegia and hemiparesis following cerebral infarction affecting right dominant side: Secondary | ICD-10-CM | POA: Diagnosis not present

## 2015-12-09 DIAGNOSIS — I255 Ischemic cardiomyopathy: Secondary | ICD-10-CM | POA: Diagnosis present

## 2015-12-09 DIAGNOSIS — N182 Chronic kidney disease, stage 2 (mild): Secondary | ICD-10-CM | POA: Diagnosis present

## 2015-12-09 DIAGNOSIS — E1122 Type 2 diabetes mellitus with diabetic chronic kidney disease: Secondary | ICD-10-CM | POA: Diagnosis not present

## 2015-12-09 DIAGNOSIS — D62 Acute posthemorrhagic anemia: Secondary | ICD-10-CM | POA: Insufficient documentation

## 2015-12-09 DIAGNOSIS — F329 Major depressive disorder, single episode, unspecified: Secondary | ICD-10-CM | POA: Diagnosis present

## 2015-12-09 DIAGNOSIS — E871 Hypo-osmolality and hyponatremia: Secondary | ICD-10-CM | POA: Diagnosis present

## 2015-12-09 DIAGNOSIS — R402142 Coma scale, eyes open, spontaneous, at arrival to emergency department: Secondary | ICD-10-CM | POA: Diagnosis present

## 2015-12-09 DIAGNOSIS — Z951 Presence of aortocoronary bypass graft: Secondary | ICD-10-CM | POA: Diagnosis not present

## 2015-12-09 DIAGNOSIS — G8191 Hemiplegia, unspecified affecting right dominant side: Secondary | ICD-10-CM | POA: Diagnosis not present

## 2015-12-09 DIAGNOSIS — M1990 Unspecified osteoarthritis, unspecified site: Secondary | ICD-10-CM | POA: Diagnosis present

## 2015-12-09 DIAGNOSIS — R402362 Coma scale, best motor response, obeys commands, at arrival to emergency department: Secondary | ICD-10-CM | POA: Diagnosis present

## 2015-12-09 DIAGNOSIS — D72829 Elevated white blood cell count, unspecified: Secondary | ICD-10-CM | POA: Insufficient documentation

## 2015-12-09 DIAGNOSIS — I6339 Cerebral infarction due to thrombosis of other cerebral artery: Secondary | ICD-10-CM

## 2015-12-09 DIAGNOSIS — Z880 Allergy status to penicillin: Secondary | ICD-10-CM

## 2015-12-09 DIAGNOSIS — R471 Dysarthria and anarthria: Secondary | ICD-10-CM | POA: Diagnosis present

## 2015-12-09 DIAGNOSIS — R29703 NIHSS score 3: Secondary | ICD-10-CM | POA: Diagnosis present

## 2015-12-09 DIAGNOSIS — K219 Gastro-esophageal reflux disease without esophagitis: Secondary | ICD-10-CM | POA: Diagnosis present

## 2015-12-09 DIAGNOSIS — I10 Essential (primary) hypertension: Secondary | ICD-10-CM | POA: Diagnosis not present

## 2015-12-09 DIAGNOSIS — E119 Type 2 diabetes mellitus without complications: Secondary | ICD-10-CM | POA: Diagnosis not present

## 2015-12-09 DIAGNOSIS — Z79899 Other long term (current) drug therapy: Secondary | ICD-10-CM | POA: Diagnosis not present

## 2015-12-09 DIAGNOSIS — M4802 Spinal stenosis, cervical region: Secondary | ICD-10-CM

## 2015-12-09 DIAGNOSIS — D649 Anemia, unspecified: Secondary | ICD-10-CM | POA: Diagnosis present

## 2015-12-09 DIAGNOSIS — I639 Cerebral infarction, unspecified: Principal | ICD-10-CM | POA: Diagnosis present

## 2015-12-09 DIAGNOSIS — I6349 Cerebral infarction due to embolism of other cerebral artery: Secondary | ICD-10-CM | POA: Diagnosis not present

## 2015-12-09 DIAGNOSIS — I5032 Chronic diastolic (congestive) heart failure: Secondary | ICD-10-CM | POA: Diagnosis not present

## 2015-12-09 DIAGNOSIS — E785 Hyperlipidemia, unspecified: Secondary | ICD-10-CM | POA: Diagnosis not present

## 2015-12-09 DIAGNOSIS — R001 Bradycardia, unspecified: Secondary | ICD-10-CM | POA: Insufficient documentation

## 2015-12-09 DIAGNOSIS — I638 Other cerebral infarction: Secondary | ICD-10-CM | POA: Diagnosis not present

## 2015-12-09 DIAGNOSIS — I454 Nonspecific intraventricular block: Secondary | ICD-10-CM | POA: Diagnosis present

## 2015-12-09 DIAGNOSIS — E876 Hypokalemia: Secondary | ICD-10-CM | POA: Diagnosis not present

## 2015-12-09 DIAGNOSIS — Z888 Allergy status to other drugs, medicaments and biological substances status: Secondary | ICD-10-CM | POA: Diagnosis not present

## 2015-12-09 DIAGNOSIS — Z8249 Family history of ischemic heart disease and other diseases of the circulatory system: Secondary | ICD-10-CM

## 2015-12-09 DIAGNOSIS — Z7982 Long term (current) use of aspirin: Secondary | ICD-10-CM

## 2015-12-09 DIAGNOSIS — E782 Mixed hyperlipidemia: Secondary | ICD-10-CM

## 2015-12-09 DIAGNOSIS — Z823 Family history of stroke: Secondary | ICD-10-CM

## 2015-12-09 DIAGNOSIS — I63012 Cerebral infarction due to thrombosis of left vertebral artery: Secondary | ICD-10-CM | POA: Diagnosis not present

## 2015-12-09 DIAGNOSIS — I69322 Dysarthria following cerebral infarction: Secondary | ICD-10-CM | POA: Diagnosis not present

## 2015-12-09 DIAGNOSIS — R531 Weakness: Secondary | ICD-10-CM | POA: Diagnosis not present

## 2015-12-09 LAB — COMPREHENSIVE METABOLIC PANEL
ALT: 25 U/L (ref 14–54)
AST: 28 U/L (ref 15–41)
Albumin: 3.6 g/dL (ref 3.5–5.0)
Alkaline Phosphatase: 58 U/L (ref 38–126)
Anion gap: 14 (ref 5–15)
BUN: 33 mg/dL — ABNORMAL HIGH (ref 6–20)
CO2: 22 mmol/L (ref 22–32)
Calcium: 8.8 mg/dL — ABNORMAL LOW (ref 8.9–10.3)
Chloride: 99 mmol/L — ABNORMAL LOW (ref 101–111)
Creatinine, Ser: 1.29 mg/dL — ABNORMAL HIGH (ref 0.44–1.00)
GFR calc Af Amer: 42 mL/min — ABNORMAL LOW (ref >60)
GFR calc non Af Amer: 36 mL/min — ABNORMAL LOW (ref >60)
Glucose, Bld: 158 mg/dL — ABNORMAL HIGH (ref 65–99)
Potassium: 3.6 mmol/L (ref 3.5–5.1)
Sodium: 135 mmol/L (ref 135–145)
Total Bilirubin: 0.4 mg/dL (ref 0.3–1.2)
Total Protein: 6.5 g/dL (ref 6.5–8.1)

## 2015-12-09 LAB — URINALYSIS, ROUTINE W REFLEX MICROSCOPIC
Bilirubin Urine: NEGATIVE
Glucose, UA: NEGATIVE mg/dL
Hgb urine dipstick: NEGATIVE
Ketones, ur: NEGATIVE mg/dL
Nitrite: NEGATIVE
Protein, ur: NEGATIVE mg/dL
Specific Gravity, Urine: 1.018 (ref 1.005–1.030)
pH: 5.5 (ref 5.0–8.0)

## 2015-12-09 LAB — URINE MICROSCOPIC-ADD ON: RBC / HPF: NONE SEEN RBC/hpf (ref 0–5)

## 2015-12-09 LAB — GLUCOSE, CAPILLARY: Glucose-Capillary: 156 mg/dL — ABNORMAL HIGH (ref 65–99)

## 2015-12-09 LAB — I-STAT CHEM 8, ED
BUN: 33 mg/dL — ABNORMAL HIGH (ref 6–20)
Calcium, Ion: 1.04 mmol/L — ABNORMAL LOW (ref 1.13–1.30)
Chloride: 98 mmol/L — ABNORMAL LOW (ref 101–111)
Creatinine, Ser: 1.3 mg/dL — ABNORMAL HIGH (ref 0.44–1.00)
Glucose, Bld: 144 mg/dL — ABNORMAL HIGH (ref 65–99)
HCT: 38 % (ref 36.0–46.0)
Hemoglobin: 12.9 g/dL (ref 12.0–15.0)
Potassium: 3.3 mmol/L — ABNORMAL LOW (ref 3.5–5.1)
Sodium: 136 mmol/L (ref 135–145)
TCO2: 23 mmol/L (ref 0–100)

## 2015-12-09 LAB — CBC
HCT: 35 % — ABNORMAL LOW (ref 36.0–46.0)
Hemoglobin: 11.2 g/dL — ABNORMAL LOW (ref 12.0–15.0)
MCH: 23.6 pg — ABNORMAL LOW (ref 26.0–34.0)
MCHC: 32 g/dL (ref 30.0–36.0)
MCV: 73.7 fL — ABNORMAL LOW (ref 78.0–100.0)
Platelets: 323 10*3/uL (ref 150–400)
RBC: 4.75 MIL/uL (ref 3.87–5.11)
RDW: 16.6 % — ABNORMAL HIGH (ref 11.5–15.5)
WBC: 11.5 10*3/uL — ABNORMAL HIGH (ref 4.0–10.5)

## 2015-12-09 LAB — DIFFERENTIAL
Basophils Absolute: 0 10*3/uL (ref 0.0–0.1)
Basophils Relative: 0 %
Eosinophils Absolute: 0.2 10*3/uL (ref 0.0–0.7)
Eosinophils Relative: 2 %
Lymphocytes Relative: 33 %
Lymphs Abs: 3.8 10*3/uL (ref 0.7–4.0)
Monocytes Absolute: 1.2 10*3/uL — ABNORMAL HIGH (ref 0.1–1.0)
Monocytes Relative: 10 %
Neutro Abs: 6.4 10*3/uL (ref 1.7–7.7)
Neutrophils Relative %: 55 %

## 2015-12-09 LAB — I-STAT TROPONIN, ED: Troponin i, poc: 0.05 ng/mL (ref 0.00–0.08)

## 2015-12-09 LAB — PROTIME-INR
INR: 1.02 (ref 0.00–1.49)
Prothrombin Time: 13.6 seconds (ref 11.6–15.2)

## 2015-12-09 LAB — APTT: aPTT: 29 seconds (ref 24–37)

## 2015-12-09 LAB — CBG MONITORING, ED: Glucose-Capillary: 151 mg/dL — ABNORMAL HIGH (ref 65–99)

## 2015-12-09 MED ORDER — OMEGA-3 FATTY ACIDS 1000 MG PO CAPS: 1.0000 g | ORAL_CAPSULE | Freq: Every day | ORAL | Status: DC

## 2015-12-09 MED ORDER — INSULIN ASPART 100 UNIT/ML ~~LOC~~ SOLN
0.0000 [IU] | Freq: Three times a day (TID) | SUBCUTANEOUS | Status: DC
  Administered 2015-12-10 (×2): 1 [IU] via SUBCUTANEOUS
  Administered 2015-12-11: 2 [IU] via SUBCUTANEOUS

## 2015-12-09 MED ORDER — ASPIRIN EC 81 MG PO TBEC: 81.0000 mg | DELAYED_RELEASE_TABLET | Freq: Every day | ORAL | Status: DC

## 2015-12-09 MED ORDER — OXYCODONE-ACETAMINOPHEN 5-325 MG PO TABS: 1.0000 | ORAL_TABLET | ORAL | Status: DC | PRN

## 2015-12-09 MED ORDER — LISINOPRIL 20 MG PO TABS
20.0000 mg | ORAL_TABLET | Freq: Every day | ORAL | Status: DC
  Administered 2015-12-10 – 2015-12-12 (×3): 20 mg via ORAL
  Filled 2015-12-09 (×3): qty 1

## 2015-12-09 MED ORDER — SERTRALINE HCL 100 MG PO TABS
100.0000 mg | ORAL_TABLET | Freq: Every day | ORAL | Status: DC
  Administered 2015-12-09 – 2015-12-11 (×3): 100 mg via ORAL
  Filled 2015-12-09 (×3): qty 1

## 2015-12-09 MED ORDER — ACETAMINOPHEN 325 MG PO TABS: 650.0000 mg | ORAL_TABLET | ORAL | Status: DC | PRN

## 2015-12-09 MED ORDER — STROKE: EARLY STAGES OF RECOVERY BOOK
Freq: Once | Status: AC
  Administered 2015-12-09: 23:00:00

## 2015-12-09 MED ORDER — CYCLOBENZAPRINE HCL 10 MG PO TABS: 10.0000 mg | ORAL_TABLET | Freq: Three times a day (TID) | ORAL | Status: DC | PRN

## 2015-12-09 MED ORDER — ACETAMINOPHEN 650 MG RE SUPP: 650.0000 mg | RECTAL | Status: DC | PRN

## 2015-12-09 MED ORDER — SENNOSIDES-DOCUSATE SODIUM 8.6-50 MG PO TABS: 1.0000 | ORAL_TABLET | Freq: Every evening | ORAL | Status: DC | PRN

## 2015-12-09 MED ORDER — OMEGA-3-ACID ETHYL ESTERS 1 G PO CAPS
1.0000 g | ORAL_CAPSULE | Freq: Every day | ORAL | Status: DC
  Administered 2015-12-10 – 2015-12-12 (×3): 1 g via ORAL
  Filled 2015-12-09 (×3): qty 1

## 2015-12-09 MED ORDER — HYDROMORPHONE HCL 1 MG/ML IJ SOLN: 0.5000 mg | INTRAMUSCULAR | Status: DC | PRN

## 2015-12-09 MED ORDER — LORAZEPAM 1 MG PO TABS
1.0000 mg | ORAL_TABLET | Freq: Every day | ORAL | Status: DC
  Administered 2015-12-09 – 2015-12-11 (×3): 1 mg via ORAL
  Filled 2015-12-09 (×3): qty 1

## 2015-12-09 MED ORDER — PANTOPRAZOLE SODIUM 40 MG PO TBEC
40.0000 mg | DELAYED_RELEASE_TABLET | Freq: Every day | ORAL | Status: DC
  Administered 2015-12-10 – 2015-12-12 (×3): 40 mg via ORAL
  Filled 2015-12-09 (×3): qty 1

## 2015-12-09 MED ORDER — TIMOLOL MALEATE 0.5 % OP SOLN
1.0000 [drp] | Freq: Every morning | OPHTHALMIC | Status: DC
  Administered 2015-12-10 – 2015-12-12 (×3): 1 [drp] via OPHTHALMIC
  Filled 2015-12-09: qty 5

## 2015-12-09 NOTE — ED Notes (Signed)
MD at bedside. 

## 2015-12-09 NOTE — ED Notes (Addendum)
Daughter arrived and stated went to [facility] Monday for neck pain and right leg weakness, CT scan done of head. Sent home with hydrocodone and prednisone. Took back to [facility] yesterday for difficulty walking and SBP of 203, did CT of back. Showed some stenosis, told she needed an outpatient MRI. Daughter states speech was slurred getting in car at [place] last night, attributed to Hydrocodone given in ED and went to bed. Today she could not hold her medicines in her right hand this morning and even more difficulty walking.

## 2015-12-09 NOTE — H&P (Signed)
[facility] History and Physical  MARGARY ATTALLA [id number] DOB: 1926-10-13 DOA: 12/09/2015  PCP: Rochel Brome, MD  Specialists: Dr. Christella Noa (Neurosurgery)  Chief Complaint: Right sided weakness, slurred speech, neck pain  HPI: [name] is a 82 y.o. woman with a history of CAD S/P CABG, HTN, HLD, DM, and systolic heart failure who presents for evaluation of right sided weakness and slurred speech.  The patient reports progressive weakness for more than one week, but she developed right sided weakness on Monday ("I couldn't lift my right leg.").  She presented to an outside ED on Monday night and was discharged home.  She went back to the outside ED on Thursday because of persistent symptoms, and she developed headache ("worst headache of her life") and was noted to have accelerated HTN.  CT scan of the head did not show acute stroke.  MRI of the brain was not pursued.  She was advised to follow-up with her neurosurgeon because there was concern that her symptoms were related to cervical spine disease.  After leaving the ED last night, she developed slurred speech and worsening right sided weakness.  She presents tonight and MRI concerning for multiple areas of acute stroke.  Hospitalist asked to admit.  Neurology consult is pending.  Headache has resolved.  No vision disturbance.  No chest pain, shortness of breath, nausea, vomiting, or diarrhea.  No syncope or LOC.  She has been having neck pain that she has been treating with OTC ibuprofen.  She received prescriptions for prednisone and hydrocodone at the outside hospital.  Review of Systems: Constipation.  Weakness.  Otherwise, 12 systems reviewed and negative except as stated in the HPI.  Past Medical History  Diagnosis Date  . CAD (coronary artery disease)   . HTN (hypertension)   . Dyslipidemia   . Cardiomyopathy, ischemic     EF 40% at the time cath  . Arthritis   . Anemia     hx  . Myocardial infarction (HCC)      mild  . Diabetes mellitus without complication (HCC)     borderline no meds  . Depression   . Urinary frequency   . GERD (gastroesophageal reflux disease)    Past Surgical History  Procedure Laterality Date  . Coronary artery bypass graft      LIMA to the LAD, SVG to diagonal, SVG to obtuse marginal, SVG to PDA..  2008  . Shoulder arthroscopy w/ rotator cuff repair Right     04  . Appendectomy    . Tonsillectomy    . Cesarean section      x2  . Lumbar laminectomy/decompression microdiscectomy N/A 05/01/2013    Procedure: LUMBAR LAMINECTOMY/DECOMPRESSION MICRODISCECTOMY 2 LEVELS;  Surgeon: Winfield Cunas, MD;  Location: [facility];  Service: Neurosurgery;  Laterality: N/A;  Lumbar Three-Four, Four-Five Laminectomies   . Lumbar laminectomy/decompression microdiscectomy N/A 07/14/2014    Procedure: LUMBAR LAMINECTOMY/DECOMPRESSION MICRODISCECTOMY 1 LEVEL LUMBAR TWO-THREE;  Surgeon: Ashok Pall, MD;  Location: [facility];  Service: Neurosurgery;  Laterality: N/A;   Social History:  Social History   Social History Narrative  She is a widow.  She lives independently.  No tobacco, EtOH, or illicit drug use.  She has two daughters.  Allergies  Allergen Reactions  . Atorvastatin Other (See Comments)    Muscle pain  . Statins Other (See Comments)    MUSCLE PAIN  . Penicillins Other (See Comments)    Family History  Problem Relation Age of Onset  .  Stroke Mother   . Heart attack Father    Prior to Admission medications   Medication Sig Start Date End Date Taking? Authorizing Provider  aspirin 81 MG tablet Take 81 mg by mouth daily.     Yes Historical Provider, MD  cyclobenzaprine (FLEXERIL) 10 MG tablet Take 10 mg by mouth 3 (three) times daily as needed for muscle spasms.   Yes Historical Provider, MD  fish oil-omega-3 fatty acids 1000 MG capsule Take 1 g by mouth daily.    Yes Historical Provider, MD  furosemide (LASIX) 20 MG tablet Take 20 mg by mouth 2 (two) times daily as  needed for fluid.    Yes Historical Provider, MD  ibuprofen (ADVIL,MOTRIN) 200 MG tablet Take 400 mg by mouth every 6 (six) hours as needed for moderate pain.   Yes Historical Provider, MD  lisinopril (PRINIVIL,ZESTRIL) 20 MG tablet Take 20 mg by mouth daily. 11/28/15  Yes Historical Provider, MD  LORazepam (ATIVAN) 1 MG tablet Take 1 mg by mouth at bedtime.   Yes Historical Provider, MD  omeprazole (PRILOSEC) 20 MG capsule Take 20 mg by mouth daily.     Yes Historical Provider, MD  oxyCODONE-acetaminophen (PERCOCET/ROXICET) 5-325 MG tablet Take 1 tablet by mouth every 4 (four) hours as needed for severe pain.   Yes Historical Provider, MD  sertraline (ZOLOFT) 100 MG tablet Take 100 mg by mouth at bedtime.   Yes Historical Provider, MD  timolol (TIMOPTIC) 0.5 % ophthalmic solution Place 1 drop into both eyes every morning. 09/02/15  Yes Historical Provider, MD   Physical Exam: Filed Vitals:   12/09/15 1845 12/09/15 1930 12/09/15 2008 12/09/15 2030  BP: 116/48 157/61 147/63 147/63  Pulse: 58 57 58 60  Temp:      TempSrc:      Resp: 14 12 12 12   SpO2: 98% 95% 97% 96%     General:  Awake and alert.  Oriented to person, place, time and situation.  NAD.  Head: [place]/AT  Eyes: PERRL bilaterally, conjunctiva are pink.  EOMI.  ENT: Mucous membranes are moist.  No nasal drainage.  Neck is supple.  Cardiovascular: NR/RR.  No LE edema.   Respiratory: CTA bilaterally.  GI: Abdomen is soft/NT/ND.  Bowel sounds are present.  No guarding.  Skin: Warm and dry.  Musculoskeletal: Moves all four extremities spontaneously.  Psychiatric: Normal affect.  Neurologic: CN grossly intact; right sided weakness present.  Labs on Admission:  Basic Metabolic Panel:  Recent Labs Lab 12/09/15 1312 12/09/15 1330  NA 135 136  K 3.6 3.3*  CL 99* 98*  CO2 22  --   GLUCOSE 158* 144*  BUN 33* 33*  CREATININE 1.29* 1.30*  CALCIUM 8.8*  --    Liver Function Tests:  Recent Labs Lab 12/09/15 1312  AST  28  ALT 25  ALKPHOS 58  BILITOT 0.4  PROT 6.5  ALBUMIN 3.6   CBC:  Recent Labs Lab 12/09/15 1312 12/09/15 1330  WBC 11.5*  --   NEUTROABS 6.4  --   HGB 11.2* 12.9  HCT 35.0* 38.0  MCV 73.7*  --   PLT 323  --    CBG:  Recent Labs Lab 12/09/15 1317  GLUCAP 151*    Radiological Exams on Admission: Mr Brain Wo Contrast  12/09/2015  CLINICAL DATA:  Neck pain with RIGHT leg weakness began Monday. New onset of RIGHT hand weakness with slurred speech. EXAM: MRI HEAD WITHOUT CONTRAST MRI CERVICAL SPINE WITHOUT CONTRAST TECHNIQUE: Multiplanar, multiecho pulse  sequences of the brain and surrounding structures, and cervical spine, to include the craniocervical junction and cervicothoracic junction, were obtained without intravenous contrast. COMPARISON:  Noncontrast CT head 12/05/2015. Noncontrast CT cervical spine 12/08/2015. FINDINGS: MRI HEAD FINDINGS Multifocal areas of restricted diffusion in the LEFT hemisphere. These involve the posterior lentiform nucleus, posterior limb internal capsule, and LEFT lateral thalamus. I am unsure of the exact vascular territory, as these infarcts could represent a MCA lenticulostriate distribution or a PCA thalamoperforate distribution. No other areas of acute infarction are identified. No hemorrhage, mass lesion, hydrocephalus, or extra-axial fluid. Mild cerebral and cerebellar atrophy not unexpected for age. Mild T2 and FLAIR hyperintensities of white matter, likely chronic microvascular ischemic change. Flow voids are maintained in the carotid, basilar, and vertebral arteries. LEFT vertebral dominant. Moderate atheromatous change of the basilar and LEFT vertebral, calcific on CT. No midline abnormality other than mild pannus. BILATERAL cataract extraction. Extracranial soft tissues unremarkable. MRI CERVICAL SPINE FINDINGS Alignment: 2 mm anterolisthesis at C2-3 is facet mediated. No traumatic subluxation is evident. Vertebrae: No worrisome osseous lesion.  Endplate reactive changes of chronic nature at C5-C6. No evidence for occult cervical spine fracture. Cord: Moderate cord flattening at C5-C6. Abnormal cord signal not established. Vertebral Arteries: Patent. LEFT dominant. Carotid atherosclerosis bilaterally. Paraspinal tissues: Unremarkable. Disc levels: The individual disc spaces were examined as follows: C2-3: 2 mm anterolisthesis. This is facet mediated, worse on the LEFT. Left-sided uncinate spurring could affect the C3 nerve root. C3-4: Severe disc space narrowing and left-sided uncinate spurring/ facet arthropathy. LEFT C4 nerve root impingement. C4-5: Central disc osteophyte complex with BILATERAL RIGHT greater than LEFT facet arthropathy. RIGHT-sided uncinate spurring narrows the foramen. Mild central canal stenosis without frank cord compression. RIGHT C5 nerve root impingement. C5-6: Severe disc space narrowing. Central disc osteophyte complex results in moderate to severe stenosis, worse on the LEFT. BILATERAL facet arthropathy and uncinate spurring compress both C6 nerve roots. C6-7: Mild disc space narrowing. LEFT-sided uncinate spurring. No definite impingement. C7-T1:  Calcified posterior longitudinal ligament.  No impingement. IMPRESSION: Multifocal areas of LEFT hemisphere infarction affecting the basal ganglia, deep white matter, and thalamus. This could reflect ischemia within the LEFT MCA lenticulostriate territory, LEFT PCA thalamoperforate territory, or both. Atrophy and small vessel disease. No posttraumatic sequelae in the cervical spine. Moderate to severe stenosis at C5-6. Multilevel foraminal narrowing due to uncinate spurring and facet arthropathy. Electronically Signed   By: Staci Righter M.D.   On: 12/09/2015 18:04   Mr Cervical Spine Wo Contrast  12/09/2015  CLINICAL DATA:  Neck pain with RIGHT leg weakness began Monday. New onset of RIGHT hand weakness with slurred speech. EXAM: MRI HEAD WITHOUT CONTRAST MRI CERVICAL SPINE  WITHOUT CONTRAST TECHNIQUE: Multiplanar, multiecho pulse sequences of the brain and surrounding structures, and cervical spine, to include the craniocervical junction and cervicothoracic junction, were obtained without intravenous contrast. COMPARISON:  Noncontrast CT head 12/05/2015. Noncontrast CT cervical spine 12/08/2015. FINDINGS: MRI HEAD FINDINGS Multifocal areas of restricted diffusion in the LEFT hemisphere. These involve the posterior lentiform nucleus, posterior limb internal capsule, and LEFT lateral thalamus. I am unsure of the exact vascular territory, as these infarcts could represent a MCA lenticulostriate distribution or a PCA thalamoperforate distribution. No other areas of acute infarction are identified. No hemorrhage, mass lesion, hydrocephalus, or extra-axial fluid. Mild cerebral and cerebellar atrophy not unexpected for age. Mild T2 and FLAIR hyperintensities of white matter, likely chronic microvascular ischemic change. Flow voids are maintained in the carotid, basilar, and  vertebral arteries. LEFT vertebral dominant. Moderate atheromatous change of the basilar and LEFT vertebral, calcific on CT. No midline abnormality other than mild pannus. BILATERAL cataract extraction. Extracranial soft tissues unremarkable. MRI CERVICAL SPINE FINDINGS Alignment: 2 mm anterolisthesis at C2-3 is facet mediated. No traumatic subluxation is evident. Vertebrae: No worrisome osseous lesion. Endplate reactive changes of chronic nature at C5-C6. No evidence for occult cervical spine fracture. Cord: Moderate cord flattening at C5-C6. Abnormal cord signal not established. Vertebral Arteries: Patent. LEFT dominant. Carotid atherosclerosis bilaterally. Paraspinal tissues: Unremarkable. Disc levels: The individual disc spaces were examined as follows: C2-3: 2 mm anterolisthesis. This is facet mediated, worse on the LEFT. Left-sided uncinate spurring could affect the C3 nerve root. C3-4: Severe disc space narrowing  and left-sided uncinate spurring/ facet arthropathy. LEFT C4 nerve root impingement. C4-5: Central disc osteophyte complex with BILATERAL RIGHT greater than LEFT facet arthropathy. RIGHT-sided uncinate spurring narrows the foramen. Mild central canal stenosis without frank cord compression. RIGHT C5 nerve root impingement. C5-6: Severe disc space narrowing. Central disc osteophyte complex results in moderate to severe stenosis, worse on the LEFT. BILATERAL facet arthropathy and uncinate spurring compress both C6 nerve roots. C6-7: Mild disc space narrowing. LEFT-sided uncinate spurring. No definite impingement. C7-T1:  Calcified posterior longitudinal ligament.  No impingement. IMPRESSION: Multifocal areas of LEFT hemisphere infarction affecting the basal ganglia, deep white matter, and thalamus. This could reflect ischemia within the LEFT MCA lenticulostriate territory, LEFT PCA thalamoperforate territory, or both. Atrophy and small vessel disease. No posttraumatic sequelae in the cervical spine. Moderate to severe stenosis at C5-6. Multilevel foraminal narrowing due to uncinate spurring and facet arthropathy. Electronically Signed   By: Staci Righter M.D.   On: 12/09/2015 18:04    EKG: Independently reviewed. NSR.  No acute ST segment elevations  Assessment/Plan Principal Problem:   CVA (cerebral infarction) Active Problems:   Essential hypertension   Coronary atherosclerosis   HLD (hyperlipidemia)   DM (diabetes mellitus) (HCC)   Cervical stenosis of spinal canal   Admit to telemetry  Acute CVA --Neurology consulted from the ED --MRA, carotid ultrasound, complete echo --Patient was already on baby aspirin at home; will increase dose to 325mg  daily --A1c and fasting lipid panel in the AM --Telemetry monitoring to screen for arrhythmias --PT/OT/Speech therapy consults  Moderate to severe Cervical Spine stenosis --Will need to follow-up with her neurosurgeon Dr. Christella Noa when she is stable  from her acute strokes  HTN --Continue lisinopril for now.  Avoid NSAIDs.  She may need additional blood pressure coverage.  Code Status: FULL Family Communication: One daughter at bedside Disposition Plan: To be determined  Time spent: 50 minutes  [employer] [facility]  12/09/2015, 9:09 PM

## 2015-12-09 NOTE — Progress Notes (Signed)
Arrived from Ed. Alert and oriented. Denies any pain. Oriented to room. Dtr at bedside. Call light within reach.

## 2015-12-09 NOTE — ED Notes (Signed)
Patient transported to MRI 

## 2015-12-09 NOTE — ED Provider Notes (Signed)
19:25- reevaluation after return of MRI imaging. Imaging of the brain is possibly consistent with subacute CVA, left-sided. Cervical spine imaging consistent with multi-factorial spinal stenosis.  At this time- patient is improved, per report of her daughter. She complains of disability, right leg and right arm, for 5 days, worse since last night. Last night, while being imaged, at [facility]. She had transient left-sided headache. Today she had some dysarthria, and increased weakness, right-sided, so she was brought here for evaluation.  Findings discussed with patient, and daughter, they agreed to admission, for further evaluation and treatment.  19:35- paged to neurology, for consultation. Dr. Nicole Kindred will see the patient  19:45- page for hospitalist admission. Dr. Doyle Askew will admit the patient  Mr Brain Wo Contrast  12/09/2015  CLINICAL DATA:  Neck pain with RIGHT leg weakness began Monday. New onset of RIGHT hand weakness with slurred speech. EXAM: MRI HEAD WITHOUT CONTRAST MRI CERVICAL SPINE WITHOUT CONTRAST TECHNIQUE: Multiplanar, multiecho pulse sequences of the brain and surrounding structures, and cervical spine, to include the craniocervical junction and cervicothoracic junction, were obtained without intravenous contrast. COMPARISON:  Noncontrast CT head 12/05/2015. Noncontrast CT cervical spine 12/08/2015. FINDINGS: MRI HEAD FINDINGS Multifocal areas of restricted diffusion in the LEFT hemisphere. These involve the posterior lentiform nucleus, posterior limb internal capsule, and LEFT lateral thalamus. I am unsure of the exact vascular territory, as these infarcts could represent a MCA lenticulostriate distribution or a PCA thalamoperforate distribution. No other areas of acute infarction are identified. No hemorrhage, mass lesion, hydrocephalus, or extra-axial fluid. Mild cerebral and cerebellar atrophy not unexpected for age. Mild T2 and FLAIR hyperintensities of white matter, likely  chronic microvascular ischemic change. Flow voids are maintained in the carotid, basilar, and vertebral arteries. LEFT vertebral dominant. Moderate atheromatous change of the basilar and LEFT vertebral, calcific on CT. No midline abnormality other than mild pannus. BILATERAL cataract extraction. Extracranial soft tissues unremarkable. MRI CERVICAL SPINE FINDINGS Alignment: 2 mm anterolisthesis at C2-3 is facet mediated. No traumatic subluxation is evident. Vertebrae: No worrisome osseous lesion. Endplate reactive changes of chronic nature at C5-C6. No evidence for occult cervical spine fracture. Cord: Moderate cord flattening at C5-C6. Abnormal cord signal not established. Vertebral Arteries: Patent. LEFT dominant. Carotid atherosclerosis bilaterally. Paraspinal tissues: Unremarkable. Disc levels: The individual disc spaces were examined as follows: C2-3: 2 mm anterolisthesis. This is facet mediated, worse on the LEFT. Left-sided uncinate spurring could affect the C3 nerve root. C3-4: Severe disc space narrowing and left-sided uncinate spurring/ facet arthropathy. LEFT C4 nerve root impingement. C4-5: Central disc osteophyte complex with BILATERAL RIGHT greater than LEFT facet arthropathy. RIGHT-sided uncinate spurring narrows the foramen. Mild central canal stenosis without frank cord compression. RIGHT C5 nerve root impingement. C5-6: Severe disc space narrowing. Central disc osteophyte complex results in moderate to severe stenosis, worse on the LEFT. BILATERAL facet arthropathy and uncinate spurring compress both C6 nerve roots. C6-7: Mild disc space narrowing. LEFT-sided uncinate spurring. No definite impingement. C7-T1:  Calcified posterior longitudinal ligament.  No impingement. IMPRESSION: Multifocal areas of LEFT hemisphere infarction affecting the basal ganglia, deep white matter, and thalamus. This could reflect ischemia within the LEFT MCA lenticulostriate territory, LEFT PCA thalamoperforate territory,  or both. Atrophy and small vessel disease. No posttraumatic sequelae in the cervical spine. Moderate to severe stenosis at C5-6. Multilevel foraminal narrowing due to uncinate spurring and facet arthropathy. Electronically Signed   By: Staci Righter M.D.   On: 12/09/2015 18:04   Mr Cervical Spine Wo Contrast  12/09/2015  CLINICAL DATA:  Neck pain with RIGHT leg weakness began Monday. New onset of RIGHT hand weakness with slurred speech. EXAM: MRI HEAD WITHOUT CONTRAST MRI CERVICAL SPINE WITHOUT CONTRAST TECHNIQUE: Multiplanar, multiecho pulse sequences of the brain and surrounding structures, and cervical spine, to include the craniocervical junction and cervicothoracic junction, were obtained without intravenous contrast. COMPARISON:  Noncontrast CT head 12/05/2015. Noncontrast CT cervical spine 12/08/2015. FINDINGS: MRI HEAD FINDINGS Multifocal areas of restricted diffusion in the LEFT hemisphere. These involve the posterior lentiform nucleus, posterior limb internal capsule, and LEFT lateral thalamus. I am unsure of the exact vascular territory, as these infarcts could represent a MCA lenticulostriate distribution or a PCA thalamoperforate distribution. No other areas of acute infarction are identified. No hemorrhage, mass lesion, hydrocephalus, or extra-axial fluid. Mild cerebral and cerebellar atrophy not unexpected for age. Mild T2 and FLAIR hyperintensities of white matter, likely chronic microvascular ischemic change. Flow voids are maintained in the carotid, basilar, and vertebral arteries. LEFT vertebral dominant. Moderate atheromatous change of the basilar and LEFT vertebral, calcific on CT. No midline abnormality other than mild pannus. BILATERAL cataract extraction. Extracranial soft tissues unremarkable. MRI CERVICAL SPINE FINDINGS Alignment: 2 mm anterolisthesis at C2-3 is facet mediated. No traumatic subluxation is evident. Vertebrae: No worrisome osseous lesion. Endplate reactive changes of  chronic nature at C5-C6. No evidence for occult cervical spine fracture. Cord: Moderate cord flattening at C5-C6. Abnormal cord signal not established. Vertebral Arteries: Patent. LEFT dominant. Carotid atherosclerosis bilaterally. Paraspinal tissues: Unremarkable. Disc levels: The individual disc spaces were examined as follows: C2-3: 2 mm anterolisthesis. This is facet mediated, worse on the LEFT. Left-sided uncinate spurring could affect the C3 nerve root. C3-4: Severe disc space narrowing and left-sided uncinate spurring/ facet arthropathy. LEFT C4 nerve root impingement. C4-5: Central disc osteophyte complex with BILATERAL RIGHT greater than LEFT facet arthropathy. RIGHT-sided uncinate spurring narrows the foramen. Mild central canal stenosis without frank cord compression. RIGHT C5 nerve root impingement. C5-6: Severe disc space narrowing. Central disc osteophyte complex results in moderate to severe stenosis, worse on the LEFT. BILATERAL facet arthropathy and uncinate spurring compress both C6 nerve roots. C6-7: Mild disc space narrowing. LEFT-sided uncinate spurring. No definite impingement. C7-T1:  Calcified posterior longitudinal ligament.  No impingement. IMPRESSION: Multifocal areas of LEFT hemisphere infarction affecting the basal ganglia, deep white matter, and thalamus. This could reflect ischemia within the LEFT MCA lenticulostriate territory, LEFT PCA thalamoperforate territory, or both. Atrophy and small vessel disease. No posttraumatic sequelae in the cervical spine. Moderate to severe stenosis at C5-6. Multilevel foraminal narrowing due to uncinate spurring and facet arthropathy. Electronically Signed   By: Staci Righter M.D.   On: 12/09/2015 18:04    Daleen Bo, MD 12/09/15 2324

## 2015-12-09 NOTE — ED Notes (Signed)
Patient returned from MRI.

## 2015-12-09 NOTE — ED Notes (Signed)
Right leg weakness that started on Monday. Seen at [facility] on Monday. Did not resolve and worsened yesterday, so went back to [place] last night. Today no improvement, states she cannot walk because her leg "acts like it is stuck to the floor." Denies pain.

## 2015-12-09 NOTE — ED Provider Notes (Signed)
CSN: [id number]     Arrival date & time 12/09/15  1258 History   First MD Initiated Contact with Patient 12/09/15 1359     Chief Complaint  Patient presents with  . Extremity Weakness     (Consider location/radiation/quality/duration/timing/severity/associated sxs/prior Treatment) HPI Patient has had progressive right upper and lower extremity weakness over the last 5 days. This progressed today to the point where she is unable to walk. She normally lives alone and takes care of her activities of daily living. She's been seen in the [facility] emergency Department twice. She had CT scanning of her head and cervical spine done which revealed no acute abnormality. Was discharged and told that she would have to have an MRI as an outpatient. Presents with daughter today stating the patient has also had slurred speech. She's recently been started on hydrocodone. Denies any recent fever or chills. No falls, head or neck trauma. She has episodic right-sided neck pain which currently is improved. Denies any lower back pain. No urinary symptoms but decreased by mouth intake. Past Medical History  Diagnosis Date  . CAD (coronary artery disease)   . HTN (hypertension)   . Dyslipidemia   . Cardiomyopathy, ischemic     EF 40% at the time cath  . Arthritis   . Anemia     hx  . Myocardial infarction (HCC)     mild  . Diabetes mellitus without complication (HCC)     borderline no meds  . Depression   . Urinary frequency   . GERD (gastroesophageal reflux disease)    Past Surgical History  Procedure Laterality Date  . Coronary artery bypass graft      LIMA to the LAD, SVG to diagonal, SVG to obtuse marginal, SVG to PDA..  2008  . Shoulder arthroscopy w/ rotator cuff repair Right     04  . Appendectomy    . Tonsillectomy    . Cesarean section      x2  . Lumbar laminectomy/decompression microdiscectomy N/A 05/01/2013    Procedure: LUMBAR LAMINECTOMY/DECOMPRESSION MICRODISCECTOMY 2 LEVELS;   Surgeon: Winfield Cunas, MD;  Location: [place] [facility];  Service: Neurosurgery;  Laterality: N/A;  Lumbar Three-Four, Four-Five Laminectomies   . Lumbar laminectomy/decompression microdiscectomy N/A 07/14/2014    Procedure: LUMBAR LAMINECTOMY/DECOMPRESSION MICRODISCECTOMY 1 LEVEL LUMBAR TWO-THREE;  Surgeon: Ashok Pall, MD;  Location: [facility];  Service: Neurosurgery;  Laterality: N/A;   Family History  Problem Relation Age of Onset  . Stroke Mother   . Heart attack Father    Social History  Substance Use Topics  . Smoking status: Never Smoker   . Smokeless tobacco: None  . Alcohol Use: No   OB History    No data available     Review of Systems  Constitutional: Positive for fatigue. Negative for fever and chills.  Eyes: Negative for visual disturbance.  Respiratory: Negative for cough and shortness of breath.   Cardiovascular: Negative for chest pain, palpitations and leg swelling.  Gastrointestinal: Negative for nausea, vomiting, abdominal pain, diarrhea and constipation.  Genitourinary: Negative for dysuria, frequency, hematuria and flank pain.  Musculoskeletal: Positive for myalgias, gait problem and neck pain. Negative for back pain and neck stiffness.  Skin: Negative for rash and wound.  Neurological: Positive for weakness. Negative for dizziness, light-headedness, numbness and headaches.  All other systems reviewed and are negative.     Allergies  Atorvastatin; Statins; and Penicillins  Home Medications   Prior to Admission medications   Medication Sig  Start Date End Date Taking? Authorizing Provider  aspirin 81 MG tablet Take 81 mg by mouth daily.     Yes Historical Provider, MD  cyclobenzaprine (FLEXERIL) 10 MG tablet Take 10 mg by mouth 3 (three) times daily as needed for muscle spasms.   Yes Historical Provider, MD  fish oil-omega-3 fatty acids 1000 MG capsule Take 1 g by mouth daily.    Yes Historical Provider, MD  furosemide (LASIX) 20 MG tablet Take 20 mg  by mouth 2 (two) times daily as needed for fluid.    Yes Historical Provider, MD  ibuprofen (ADVIL,MOTRIN) 200 MG tablet Take 400 mg by mouth every 6 (six) hours as needed for moderate pain.   Yes Historical Provider, MD  lisinopril (PRINIVIL,ZESTRIL) 20 MG tablet Take 20 mg by mouth daily. 11/28/15  Yes Historical Provider, MD  LORazepam (ATIVAN) 1 MG tablet Take 1 mg by mouth at bedtime.   Yes Historical Provider, MD  omeprazole (PRILOSEC) 20 MG capsule Take 20 mg by mouth daily.     Yes Historical Provider, MD  oxyCODONE-acetaminophen (PERCOCET/ROXICET) 5-325 MG tablet Take 1 tablet by mouth every 4 (four) hours as needed for severe pain.   Yes Historical Provider, MD  sertraline (ZOLOFT) 100 MG tablet Take 100 mg by mouth at bedtime.   Yes Historical Provider, MD  timolol (TIMOPTIC) 0.5 % ophthalmic solution Place 1 drop into both eyes every morning. 09/02/15  Yes Historical Provider, MD   BP 130/57 mmHg  Pulse 58  Temp(Src) 98.8 F (37.1 C) (Oral)  Resp 18  Ht 5' 4.5" (1.638 m)  Wt 161 lb 11.2 oz (73.347 kg)  BMI 27.34 kg/m2  SpO2 95% Physical Exam  Constitutional: She is oriented to person, place, and time. She appears well-developed and well-nourished. No distress.  Mildly drowsy but easily aroused.  HENT:  Head: Normocephalic and atraumatic.  Mouth/Throat: Oropharynx is clear and moist. No oropharyngeal exudate.  Evidence of trauma.  Eyes: EOM are normal. Pupils are equal, round, and reactive to light.  Neck: Normal range of motion. Neck supple.  No meningismus. No posterior midline cervical tenderness to palpation.  Cardiovascular: Normal rate and regular rhythm.  Exam reveals no gallop and no friction rub.   No murmur heard. Pulmonary/Chest: Effort normal and breath sounds normal. No respiratory distress. She has no wheezes. She has no rales. She exhibits no tenderness.  Abdominal: Soft. Bowel sounds are normal. She exhibits no distension and no mass. There is no tenderness.  There is no rebound and no guarding.  Musculoskeletal: Normal range of motion. She exhibits no edema or tenderness.  No midline thoracic or lumbar tenderness. No CVA tenderness bilaterally. No lower extremity asymmetry or tenderness. Distal pulses are equal and intact.  Neurological: She is oriented to person, place, and time.  4/5 motor in the right upper and right lower extremities. 5/5 motor in left upper and left lower extremity. Sensation is fully intact. Cranial nerves II through XII grossly intact. Mild slurring of speech.  Skin: Skin is warm and dry. No rash noted. No erythema.  Psychiatric: She has a normal mood and affect. Her behavior is normal.  Nursing note and vitals reviewed.   ED Course  Procedures (including critical care time) Labs Review Labs Reviewed  CBC - Abnormal; Notable for the following:    WBC 11.5 (*)    Hemoglobin 11.2 (*)    HCT 35.0 (*)    MCV 73.7 (*)    MCH 23.6 (*)  RDW 16.6 (*)    All other components within normal limits  DIFFERENTIAL - Abnormal; Notable for the following:    Monocytes Absolute 1.2 (*)    All other components within normal limits  COMPREHENSIVE METABOLIC PANEL - Abnormal; Notable for the following:    Chloride 99 (*)    Glucose, Bld 158 (*)    BUN 33 (*)    Creatinine, Ser 1.29 (*)    Calcium 8.8 (*)    GFR calc non Af Amer 36 (*)    GFR calc Af Amer 42 (*)    All other components within normal limits  URINALYSIS, ROUTINE W REFLEX MICROSCOPIC (NOT AT [facility]) - Abnormal; Notable for the following:    APPearance CLOUDY (*)    Leukocytes, UA MODERATE (*)    All other components within normal limits  URINE MICROSCOPIC-ADD ON - Abnormal; Notable for the following:    Squamous Epithelial / LPF 0-5 (*)    Bacteria, UA FEW (*)    All other components within normal limits  LIPID PANEL - Abnormal; Notable for the following:    Cholesterol 327 (*)    Triglycerides 151 (*)    LDL Cholesterol 237 (*)    All other components within  normal limits  GLUCOSE, CAPILLARY - Abnormal; Notable for the following:    Glucose-Capillary 156 (*)    All other components within normal limits  GLUCOSE, CAPILLARY - Abnormal; Notable for the following:    Glucose-Capillary 101 (*)    All other components within normal limits  GLUCOSE, CAPILLARY - Abnormal; Notable for the following:    Glucose-Capillary 140 (*)    All other components within normal limits  GLUCOSE, CAPILLARY - Abnormal; Notable for the following:    Glucose-Capillary 140 (*)    All other components within normal limits  CBC - Abnormal; Notable for the following:    WBC 13.7 (*)    Hemoglobin 10.7 (*)    HCT 34.8 (*)    MCV 72.5 (*)    MCH 22.3 (*)    RDW 16.3 (*)    All other components within normal limits  BASIC METABOLIC PANEL - Abnormal; Notable for the following:    Sodium 134 (*)    Chloride 99 (*)    Glucose, Bld 140 (*)    BUN 26 (*)    GFR calc non Af Amer 50 (*)    GFR calc Af Amer 58 (*)    All other components within normal limits  GLUCOSE, CAPILLARY - Abnormal; Notable for the following:    Glucose-Capillary 175 (*)    All other components within normal limits  GLUCOSE, CAPILLARY - Abnormal; Notable for the following:    Glucose-Capillary 169 (*)    All other components within normal limits  I-STAT CHEM 8, ED - Abnormal; Notable for the following:    Potassium 3.3 (*)    Chloride 98 (*)    BUN 33 (*)    Creatinine, Ser 1.30 (*)    Glucose, Bld 144 (*)    Calcium, Ion 1.04 (*)    All other components within normal limits  CBG MONITORING, ED - Abnormal; Notable for the following:    Glucose-Capillary 151 (*)    All other components within normal limits  URINE CULTURE  PROTIME-INR  APTT  GLUCOSE, CAPILLARY  HEMOGLOBIN A1C  URINALYSIS, ROUTINE W REFLEX MICROSCOPIC (NOT AT [facility])  Randolm Idol, ED    Imaging Review Mr [facility] Wo Contrast  12/10/2015  CLINICAL  DATA:  Multifocal areas of LEFT hemisphere infarction of an acute  nature. Uncertain vascular territory. EXAM: MRA HEAD WITHOUT CONTRAST TECHNIQUE: Angiographic images of the Circle of Willis were obtained using MRA technique without intravenous contrast. COMPARISON:  MRI brain 12/09/2015. FINDINGS: The internal carotid arteries are widely patent. No anterior circulation disease. Basilar artery widely patent.  No PCA narrowing or occlusion. Moderately narrowed distal RIGHT vertebral, likely reflects primary contribution of this vessel to the RIGHT PICA. On the LEFT, there is a flow-limiting stenosis, 75%, of the distal V4 vertebral segment. No intracranial aneurysm. IMPRESSION: 75% stenosis of the dominant LEFT vertebral, potentially flow reducing and/or symptomatic. No anterior circulation disease affecting the LEFT ICA or LEFT MCA. Electronically Signed   By: Staci Righter M.D.   On: 12/10/2015 15:35   Mr Brain Wo Contrast  12/09/2015  CLINICAL DATA:  Neck pain with RIGHT leg weakness began Monday. New onset of RIGHT hand weakness with slurred speech. EXAM: MRI HEAD WITHOUT CONTRAST MRI CERVICAL SPINE WITHOUT CONTRAST TECHNIQUE: Multiplanar, multiecho pulse sequences of the brain and surrounding structures, and cervical spine, to include the craniocervical junction and cervicothoracic junction, were obtained without intravenous contrast. COMPARISON:  Noncontrast CT head 12/05/2015. Noncontrast CT cervical spine 12/08/2015. FINDINGS: MRI HEAD FINDINGS Multifocal areas of restricted diffusion in the LEFT hemisphere. These involve the posterior lentiform nucleus, posterior limb internal capsule, and LEFT lateral thalamus. I am unsure of the exact vascular territory, as these infarcts could represent a MCA lenticulostriate distribution or a PCA thalamoperforate distribution. No other areas of acute infarction are identified. No hemorrhage, mass lesion, hydrocephalus, or extra-axial fluid. Mild cerebral and cerebellar atrophy not unexpected for age. Mild T2 and FLAIR  hyperintensities of white matter, likely chronic microvascular ischemic change. Flow voids are maintained in the carotid, basilar, and vertebral arteries. LEFT vertebral dominant. Moderate atheromatous change of the basilar and LEFT vertebral, calcific on CT. No midline abnormality other than mild pannus. BILATERAL cataract extraction. Extracranial soft tissues unremarkable. MRI CERVICAL SPINE FINDINGS Alignment: 2 mm anterolisthesis at C2-3 is facet mediated. No traumatic subluxation is evident. Vertebrae: No worrisome osseous lesion. Endplate reactive changes of chronic nature at C5-C6. No evidence for occult cervical spine fracture. Cord: Moderate cord flattening at C5-C6. Abnormal cord signal not established. Vertebral Arteries: Patent. LEFT dominant. Carotid atherosclerosis bilaterally. Paraspinal tissues: Unremarkable. Disc levels: The individual disc spaces were examined as follows: C2-3: 2 mm anterolisthesis. This is facet mediated, worse on the LEFT. Left-sided uncinate spurring could affect the C3 nerve root. C3-4: Severe disc space narrowing and left-sided uncinate spurring/ facet arthropathy. LEFT C4 nerve root impingement. C4-5: Central disc osteophyte complex with BILATERAL RIGHT greater than LEFT facet arthropathy. RIGHT-sided uncinate spurring narrows the foramen. Mild central canal stenosis without frank cord compression. RIGHT C5 nerve root impingement. C5-6: Severe disc space narrowing. Central disc osteophyte complex results in moderate to severe stenosis, worse on the LEFT. BILATERAL facet arthropathy and uncinate spurring compress both C6 nerve roots. C6-7: Mild disc space narrowing. LEFT-sided uncinate spurring. No definite impingement. C7-T1:  Calcified posterior longitudinal ligament.  No impingement. IMPRESSION: Multifocal areas of LEFT hemisphere infarction affecting the basal ganglia, deep white matter, and thalamus. This could reflect ischemia within the LEFT MCA lenticulostriate  territory, LEFT PCA thalamoperforate territory, or both. Atrophy and small vessel disease. No posttraumatic sequelae in the cervical spine. Moderate to severe stenosis at C5-6. Multilevel foraminal narrowing due to uncinate spurring and facet arthropathy. Electronically Signed   By: Roderic Ovens.D.  On: 12/09/2015 18:04   Mr Cervical Spine Wo Contrast  12/09/2015  CLINICAL DATA:  Neck pain with RIGHT leg weakness began Monday. New onset of RIGHT hand weakness with slurred speech. EXAM: MRI HEAD WITHOUT CONTRAST MRI CERVICAL SPINE WITHOUT CONTRAST TECHNIQUE: Multiplanar, multiecho pulse sequences of the brain and surrounding structures, and cervical spine, to include the craniocervical junction and cervicothoracic junction, were obtained without intravenous contrast. COMPARISON:  Noncontrast CT head 12/05/2015. Noncontrast CT cervical spine 12/08/2015. FINDINGS: MRI HEAD FINDINGS Multifocal areas of restricted diffusion in the LEFT hemisphere. These involve the posterior lentiform nucleus, posterior limb internal capsule, and LEFT lateral thalamus. I am unsure of the exact vascular territory, as these infarcts could represent a MCA lenticulostriate distribution or a PCA thalamoperforate distribution. No other areas of acute infarction are identified. No hemorrhage, mass lesion, hydrocephalus, or extra-axial fluid. Mild cerebral and cerebellar atrophy not unexpected for age. Mild T2 and FLAIR hyperintensities of white matter, likely chronic microvascular ischemic change. Flow voids are maintained in the carotid, basilar, and vertebral arteries. LEFT vertebral dominant. Moderate atheromatous change of the basilar and LEFT vertebral, calcific on CT. No midline abnormality other than mild pannus. BILATERAL cataract extraction. Extracranial soft tissues unremarkable. MRI CERVICAL SPINE FINDINGS Alignment: 2 mm anterolisthesis at C2-3 is facet mediated. No traumatic subluxation is evident. Vertebrae: No worrisome  osseous lesion. Endplate reactive changes of chronic nature at C5-C6. No evidence for occult cervical spine fracture. Cord: Moderate cord flattening at C5-C6. Abnormal cord signal not established. Vertebral Arteries: Patent. LEFT dominant. Carotid atherosclerosis bilaterally. Paraspinal tissues: Unremarkable. Disc levels: The individual disc spaces were examined as follows: C2-3: 2 mm anterolisthesis. This is facet mediated, worse on the LEFT. Left-sided uncinate spurring could affect the C3 nerve root. C3-4: Severe disc space narrowing and left-sided uncinate spurring/ facet arthropathy. LEFT C4 nerve root impingement. C4-5: Central disc osteophyte complex with BILATERAL RIGHT greater than LEFT facet arthropathy. RIGHT-sided uncinate spurring narrows the foramen. Mild central canal stenosis without frank cord compression. RIGHT C5 nerve root impingement. C5-6: Severe disc space narrowing. Central disc osteophyte complex results in moderate to severe stenosis, worse on the LEFT. BILATERAL facet arthropathy and uncinate spurring compress both C6 nerve roots. C6-7: Mild disc space narrowing. LEFT-sided uncinate spurring. No definite impingement. C7-T1:  Calcified posterior longitudinal ligament.  No impingement. IMPRESSION: Multifocal areas of LEFT hemisphere infarction affecting the basal ganglia, deep white matter, and thalamus. This could reflect ischemia within the LEFT MCA lenticulostriate territory, LEFT PCA thalamoperforate territory, or both. Atrophy and small vessel disease. No posttraumatic sequelae in the cervical spine. Moderate to severe stenosis at C5-6. Multilevel foraminal narrowing due to uncinate spurring and facet arthropathy. Electronically Signed   By: Staci Righter M.D.   On: 12/09/2015 18:04   I have personally reviewed and evaluated these images and lab results as part of my medical decision-making.   EKG Interpretation   Date/Time:  Friday December 09 2015 13:15:58 EDT Ventricular Rate:   63 PR Interval:  148 QRS Duration: 143 QT Interval:  561 QTC Calculation: 574 R Axis:   29 Text Interpretation:  Sinus rhythm Right bundle branch block Borderline ST  depression, lateral leads Confirmed by Lita Mains  MD, Tanis Hensarling ([id number]) on  12/09/2015 4:23:13 PM      MDM   Final diagnoses:  Cerebral infarction due to unspecified mechanism  Cervical spinal stenosis    We'll get MRI head and cervical spine. We'll likely need admission given her progressive weakness. Signed out to oncoming emergency  physician.    Julianne Rice, MD 12/11/15 [phone]

## 2015-12-09 NOTE — Consult Note (Signed)
Admission H&P    Chief Complaint: Slurred speech and right-sided weakness.  HPI: [name] is an 82 y.o. female of a history of hypertension, diabetes mellitus and hyperlipidemia presenting with new onset right-sided weakness and slurred speech. Patient's initial symptoms began with right lower extremity weakness on 12/05/2015. She was seen at [facility]. CT scan of the head was unremarkable. She began to have speech changes on 12/08/2015 and had clear weakness of right upper and lower extremity as well as her speech when she woke up this morning. She's been taking aspirin daily. MRI showed multiple areas of acute infarction involving the left basal ganglia, deep white matter and thalamus.  LSN: 12/05/2015 tPA Given: No: P.m. time window for treatment consideration mRankin:  Past Medical History  Diagnosis Date  . CAD (coronary artery disease)   . HTN (hypertension)   . Dyslipidemia   . Cardiomyopathy, ischemic     EF 40% at the time cath  . Arthritis   . Anemia     hx  . Myocardial infarction (HCC)     mild  . Diabetes mellitus without complication (HCC)     borderline no meds  . Depression   . Urinary frequency   . GERD (gastroesophageal reflux disease)     Past Surgical History  Procedure Laterality Date  . Coronary artery bypass graft      LIMA to the LAD, SVG to diagonal, SVG to obtuse marginal, SVG to PDA..  2008  . Shoulder arthroscopy w/ rotator cuff repair Right     04  . Appendectomy    . Tonsillectomy    . Cesarean section      x2  . Lumbar laminectomy/decompression microdiscectomy N/A 05/01/2013    Procedure: LUMBAR LAMINECTOMY/DECOMPRESSION MICRODISCECTOMY 2 LEVELS;  Surgeon: Winfield Cunas, MD;  Location: [facility];  Service: Neurosurgery;  Laterality: N/A;  Lumbar Three-Four, Four-Five Laminectomies   . Lumbar laminectomy/decompression microdiscectomy N/A 07/14/2014    Procedure: LUMBAR LAMINECTOMY/DECOMPRESSION MICRODISCECTOMY 1 LEVEL LUMBAR  TWO-THREE;  Surgeon: Ashok Pall, MD;  Location: [facility];  Service: Neurosurgery;  Laterality: N/A;    Family History  Problem Relation Age of Onset  . Stroke Mother   . Heart attack Father    Social History:  reports that she has never smoked. She does not have any smokeless tobacco history on file. She reports that she does not drink alcohol or use illicit drugs.  Allergies:  Allergies  Allergen Reactions  . Atorvastatin Other (See Comments)    Muscle pain  . Statins Other (See Comments)    MUSCLE PAIN  . Penicillins Other (See Comments)    Medications Prior to Admission  Medication Sig Dispense Refill  . aspirin 81 MG tablet Take 81 mg by mouth daily.      . cyclobenzaprine (FLEXERIL) 10 MG tablet Take 10 mg by mouth 3 (three) times daily as needed for muscle spasms.    . fish oil-omega-3 fatty acids 1000 MG capsule Take 1 g by mouth daily.     . furosemide (LASIX) 20 MG tablet Take 20 mg by mouth 2 (two) times daily as needed for fluid.     Marland Kitchen ibuprofen (ADVIL,MOTRIN) 200 MG tablet Take 400 mg by mouth every 6 (six) hours as needed for moderate pain.    Marland Kitchen lisinopril (PRINIVIL,ZESTRIL) 20 MG tablet Take 20 mg by mouth daily.    Marland Kitchen LORazepam (ATIVAN) 1 MG tablet Take 1 mg by mouth at bedtime.    Marland Kitchen  omeprazole (PRILOSEC) 20 MG capsule Take 20 mg by mouth daily.      Marland Kitchen oxyCODONE-acetaminophen (PERCOCET/ROXICET) 5-325 MG tablet Take 1 tablet by mouth every 4 (four) hours as needed for severe pain.    Marland Kitchen sertraline (ZOLOFT) 100 MG tablet Take 100 mg by mouth at bedtime.    . timolol (TIMOPTIC) 0.5 % ophthalmic solution Place 1 drop into both eyes every morning.  1    ROS: History obtained from patient and patient's daughter.  General ROS: negative for - chills, fatigue, fever, night sweats, weight gain or weight loss Psychological ROS: negative for - behavioral disorder, hallucinations, memory difficulties, mood swings or suicidal ideation Ophthalmic ROS: negative for - blurry  vision, double vision, eye pain or loss of vision ENT ROS: negative for - epistaxis, nasal discharge, oral lesions, sore throat, tinnitus or vertigo Allergy and Immunology ROS: negative for - hives or itchy/watery eyes Hematological and Lymphatic ROS: negative for - bleeding problems, bruising or swollen lymph nodes Endocrine ROS: negative for - galactorrhea, hair pattern changes, polydipsia/polyuria or temperature intolerance Respiratory ROS: negative for - cough, hemoptysis, shortness of breath or wheezing Cardiovascular ROS: negative for - chest pain, dyspnea on exertion, edema or irregular heartbeat Gastrointestinal ROS: negative for - abdominal pain, diarrhea, hematemesis, nausea/vomiting or stool incontinence Genito-Urinary ROS: negative for - dysuria, hematuria, incontinence or urinary frequency/urgency Musculoskeletal ROS: negative for - joint swelling or muscular weakness Neurological ROS: as noted in HPI Dermatological ROS: negative for rash and skin lesion changes  Physical Examination: Blood pressure 178/55, pulse 59, temperature 98.4 F (36.9 C), temperature source Oral, resp. rate 12, height 5' 4.5" (1.638 m), weight 71.124 kg (156 lb 12.8 oz), SpO2 96 %.  HEENT-  Normocephalic, no lesions, without obvious abnormality.  Normal external eye and conjunctiva.  Normal TM's bilaterally.  Normal auditory canals and external ears. Normal external nose, mucus membranes and septum.  Normal pharynx. Neck supple with no masses, nodes, nodules or enlargement. Cardiovascular - regular rate and rhythm, S1, S2 normal, no murmur, click, rub or gallop Lungs - chest clear, no wheezing, rales, normal symmetric air entry Abdomen - soft, non-tender; bowel sounds normal; no masses,  no organomegaly Extremities - no joint deformities, effusion, or inflammation and no edema  Neurologic Examination: Mental Status: Alert, oriented, thought content appropriate.  Speech slightly slurred without evidence  of aphasia. Able to follow commands without difficulty. Cranial Nerves: II-Visual fields were normal. III/IV/VI-Pupils were equal and reacted normally to light. Extraocular movements were full and conjugate.    V/VII-slightly reduced perception of tactile sensation over the right side of the face compared to the left; no facial weakness. VIII-normal. X-minimal dysarthria symmetrical palatal movement. XI: trapezius strength/neck flexion strength normal bilaterally XII-midline tongue extension with normal strength. Motor: Mild drift of right lower extremity with moderate proximal weakness; no drift of right upper extremity; normal strength and tone of the left extremities Sensory: Reduced perception of tactile sensation over right extremities compared to left extremities. Deep Tendon Reflexes: 1+ and symmetric. Plantars: Flexor bilaterally Cerebellar: Normal finger-to-nose testing. Carotid auscultation: Normal  Results for orders placed or performed during the hospital encounter of 12/09/15 (from the past 48 hour(s))  Protime-INR     Status: None   Collection Time: 12/09/15  1:12 PM  Result Value Ref Range   Prothrombin Time 13.6 11.6 - 15.2 seconds   INR 1.02 0.00 - 1.49  APTT     Status: None   Collection Time: 12/09/15  1:12 PM  Result  Value Ref Range   aPTT 29 24 - 37 seconds  CBC     Status: Abnormal   Collection Time: 12/09/15  1:12 PM  Result Value Ref Range   WBC 11.5 (H) 4.0 - 10.5 K/uL   RBC 4.75 3.87 - 5.11 MIL/uL   Hemoglobin 11.2 (L) 12.0 - 15.0 g/dL   HCT 35.0 (L) 36.0 - 46.0 %   MCV 73.7 (L) 78.0 - 100.0 fL   MCH 23.6 (L) 26.0 - 34.0 pg   MCHC 32.0 30.0 - 36.0 g/dL   RDW 16.6 (H) 11.5 - 15.5 %   Platelets 323 150 - 400 K/uL  Differential     Status: Abnormal   Collection Time: 12/09/15  1:12 PM  Result Value Ref Range   Neutrophils Relative % 55 %   Neutro Abs 6.4 1.7 - 7.7 K/uL   Lymphocytes Relative 33 %   Lymphs Abs 3.8 0.7 - 4.0 K/uL   Monocytes Relative  10 %   Monocytes Absolute 1.2 (H) 0.1 - 1.0 K/uL   Eosinophils Relative 2 %   Eosinophils Absolute 0.2 0.0 - 0.7 K/uL   Basophils Relative 0 %   Basophils Absolute 0.0 0.0 - 0.1 K/uL  Comprehensive metabolic panel     Status: Abnormal   Collection Time: 12/09/15  1:12 PM  Result Value Ref Range   Sodium 135 135 - 145 mmol/L   Potassium 3.6 3.5 - 5.1 mmol/L   Chloride 99 (L) 101 - 111 mmol/L   CO2 22 22 - 32 mmol/L   Glucose, Bld 158 (H) 65 - 99 mg/dL   BUN 33 (H) 6 - 20 mg/dL   Creatinine, Ser 1.29 (H) 0.44 - 1.00 mg/dL   Calcium 8.8 (L) 8.9 - 10.3 mg/dL   Total Protein 6.5 6.5 - 8.1 g/dL   Albumin 3.6 3.5 - 5.0 g/dL   AST 28 15 - 41 U/L   ALT 25 14 - 54 U/L   Alkaline Phosphatase 58 38 - 126 U/L   Total Bilirubin 0.4 0.3 - 1.2 mg/dL   GFR calc non Af Amer 36 (L) >60 mL/min   GFR calc Af Amer 42 (L) >60 mL/min    Comment: (NOTE) The eGFR has been calculated using the CKD EPI equation. This calculation has not been validated in all clinical situations. eGFR's persistently <60 mL/min signify possible Chronic Kidney Disease.    Anion gap 14 5 - 15  CBG monitoring, ED     Status: Abnormal   Collection Time: 12/09/15  1:17 PM  Result Value Ref Range   Glucose-Capillary 151 (H) 65 - 99 mg/dL  I-stat troponin, ED (not at [facility])     Status: None   Collection Time: 12/09/15  1:28 PM  Result Value Ref Range   Troponin i, poc 0.05 0.00 - 0.08 ng/mL   Comment 3            Comment: Due to the release kinetics of cTnI, a negative result within the first hours of the onset of symptoms does not rule out myocardial infarction with certainty. If myocardial infarction is still suspected, repeat the test at appropriate intervals.   I-Stat Chem 8, ED  (not at [facility])     Status: Abnormal   Collection Time: 12/09/15  1:30 PM  Result Value Ref Range   Sodium 136 135 - 145 mmol/L   Potassium 3.3 (L) 3.5 - 5.1 mmol/L   Chloride 98 (L) 101 - 111 mmol/L  BUN 33 (H) 6 - 20 mg/dL    Creatinine, Ser 1.30 (H) 0.44 - 1.00 mg/dL   Glucose, Bld 144 (H) 65 - 99 mg/dL   Calcium, Ion 1.04 (L) 1.13 - 1.30 mmol/L   TCO2 23 0 - 100 mmol/L   Hemoglobin 12.9 12.0 - 15.0 g/dL   HCT 38.0 36.0 - 46.0 %  Urinalysis, Routine w reflex microscopic (not at [facility])     Status: Abnormal   Collection Time: 12/09/15  6:19 PM  Result Value Ref Range   Color, Urine YELLOW YELLOW   APPearance CLOUDY (A) CLEAR   Specific Gravity, Urine 1.018 1.005 - 1.030   pH 5.5 5.0 - 8.0   Glucose, UA NEGATIVE NEGATIVE mg/dL   Hgb urine dipstick NEGATIVE NEGATIVE   Bilirubin Urine NEGATIVE NEGATIVE   Ketones, ur NEGATIVE NEGATIVE mg/dL   Protein, ur NEGATIVE NEGATIVE mg/dL   Nitrite NEGATIVE NEGATIVE   Leukocytes, UA MODERATE (A) NEGATIVE  Urine microscopic-add on     Status: Abnormal   Collection Time: 12/09/15  6:19 PM  Result Value Ref Range   Squamous Epithelial / LPF 0-5 (A) NONE SEEN   WBC, UA 6-30 0 - 5 WBC/hpf   RBC / HPF NONE SEEN 0 - 5 RBC/hpf   Bacteria, UA FEW (A) NONE SEEN  Glucose, capillary     Status: Abnormal   Collection Time: 12/09/15 10:15 PM  Result Value Ref Range   Glucose-Capillary 156 (H) 65 - 99 mg/dL   Comment 1 Notify RN    Comment 2 Document in Chart    Mr Brain Wo Contrast  12/09/2015  CLINICAL DATA:  Neck pain with RIGHT leg weakness began Monday. New onset of RIGHT hand weakness with slurred speech. EXAM: MRI HEAD WITHOUT CONTRAST MRI CERVICAL SPINE WITHOUT CONTRAST TECHNIQUE: Multiplanar, multiecho pulse sequences of the brain and surrounding structures, and cervical spine, to include the craniocervical junction and cervicothoracic junction, were obtained without intravenous contrast. COMPARISON:  Noncontrast CT head 12/05/2015. Noncontrast CT cervical spine 12/08/2015. FINDINGS: MRI HEAD FINDINGS Multifocal areas of restricted diffusion in the LEFT hemisphere. These involve the posterior lentiform nucleus, posterior limb internal capsule, and LEFT lateral thalamus. I am  unsure of the exact vascular territory, as these infarcts could represent a MCA lenticulostriate distribution or a PCA thalamoperforate distribution. No other areas of acute infarction are identified. No hemorrhage, mass lesion, hydrocephalus, or extra-axial fluid. Mild cerebral and cerebellar atrophy not unexpected for age. Mild T2 and FLAIR hyperintensities of white matter, likely chronic microvascular ischemic change. Flow voids are maintained in the carotid, basilar, and vertebral arteries. LEFT vertebral dominant. Moderate atheromatous change of the basilar and LEFT vertebral, calcific on CT. No midline abnormality other than mild pannus. BILATERAL cataract extraction. Extracranial soft tissues unremarkable. MRI CERVICAL SPINE FINDINGS Alignment: 2 mm anterolisthesis at C2-3 is facet mediated. No traumatic subluxation is evident. Vertebrae: No worrisome osseous lesion. Endplate reactive changes of chronic nature at C5-C6. No evidence for occult cervical spine fracture. Cord: Moderate cord flattening at C5-C6. Abnormal cord signal not established. Vertebral Arteries: Patent. LEFT dominant. Carotid atherosclerosis bilaterally. Paraspinal tissues: Unremarkable. Disc levels: The individual disc spaces were examined as follows: C2-3: 2 mm anterolisthesis. This is facet mediated, worse on the LEFT. Left-sided uncinate spurring could affect the C3 nerve root. C3-4: Severe disc space narrowing and left-sided uncinate spurring/ facet arthropathy. LEFT C4 nerve root impingement. C4-5: Central disc osteophyte complex with BILATERAL RIGHT greater than LEFT facet arthropathy. RIGHT-sided uncinate spurring narrows the  foramen. Mild central canal stenosis without frank cord compression. RIGHT C5 nerve root impingement. C5-6: Severe disc space narrowing. Central disc osteophyte complex results in moderate to severe stenosis, worse on the LEFT. BILATERAL facet arthropathy and uncinate spurring compress both C6 nerve roots.  C6-7: Mild disc space narrowing. LEFT-sided uncinate spurring. No definite impingement. C7-T1:  Calcified posterior longitudinal ligament.  No impingement. IMPRESSION: Multifocal areas of LEFT hemisphere infarction affecting the basal ganglia, deep white matter, and thalamus. This could reflect ischemia within the LEFT MCA lenticulostriate territory, LEFT PCA thalamoperforate territory, or both. Atrophy and small vessel disease. No posttraumatic sequelae in the cervical spine. Moderate to severe stenosis at C5-6. Multilevel foraminal narrowing due to uncinate spurring and facet arthropathy. Electronically Signed   By: Staci Righter M.D.   On: 12/09/2015 18:04   Mr Cervical Spine Wo Contrast  12/09/2015  CLINICAL DATA:  Neck pain with RIGHT leg weakness began Monday. New onset of RIGHT hand weakness with slurred speech. EXAM: MRI HEAD WITHOUT CONTRAST MRI CERVICAL SPINE WITHOUT CONTRAST TECHNIQUE: Multiplanar, multiecho pulse sequences of the brain and surrounding structures, and cervical spine, to include the craniocervical junction and cervicothoracic junction, were obtained without intravenous contrast. COMPARISON:  Noncontrast CT head 12/05/2015. Noncontrast CT cervical spine 12/08/2015. FINDINGS: MRI HEAD FINDINGS Multifocal areas of restricted diffusion in the LEFT hemisphere. These involve the posterior lentiform nucleus, posterior limb internal capsule, and LEFT lateral thalamus. I am unsure of the exact vascular territory, as these infarcts could represent a MCA lenticulostriate distribution or a PCA thalamoperforate distribution. No other areas of acute infarction are identified. No hemorrhage, mass lesion, hydrocephalus, or extra-axial fluid. Mild cerebral and cerebellar atrophy not unexpected for age. Mild T2 and FLAIR hyperintensities of white matter, likely chronic microvascular ischemic change. Flow voids are maintained in the carotid, basilar, and vertebral arteries. LEFT vertebral dominant.  Moderate atheromatous change of the basilar and LEFT vertebral, calcific on CT. No midline abnormality other than mild pannus. BILATERAL cataract extraction. Extracranial soft tissues unremarkable. MRI CERVICAL SPINE FINDINGS Alignment: 2 mm anterolisthesis at C2-3 is facet mediated. No traumatic subluxation is evident. Vertebrae: No worrisome osseous lesion. Endplate reactive changes of chronic nature at C5-C6. No evidence for occult cervical spine fracture. Cord: Moderate cord flattening at C5-C6. Abnormal cord signal not established. Vertebral Arteries: Patent. LEFT dominant. Carotid atherosclerosis bilaterally. Paraspinal tissues: Unremarkable. Disc levels: The individual disc spaces were examined as follows: C2-3: 2 mm anterolisthesis. This is facet mediated, worse on the LEFT. Left-sided uncinate spurring could affect the C3 nerve root. C3-4: Severe disc space narrowing and left-sided uncinate spurring/ facet arthropathy. LEFT C4 nerve root impingement. C4-5: Central disc osteophyte complex with BILATERAL RIGHT greater than LEFT facet arthropathy. RIGHT-sided uncinate spurring narrows the foramen. Mild central canal stenosis without frank cord compression. RIGHT C5 nerve root impingement. C5-6: Severe disc space narrowing. Central disc osteophyte complex results in moderate to severe stenosis, worse on the LEFT. BILATERAL facet arthropathy and uncinate spurring compress both C6 nerve roots. C6-7: Mild disc space narrowing. LEFT-sided uncinate spurring. No definite impingement. C7-T1:  Calcified posterior longitudinal ligament.  No impingement. IMPRESSION: Multifocal areas of LEFT hemisphere infarction affecting the basal ganglia, deep white matter, and thalamus. This could reflect ischemia within the LEFT MCA lenticulostriate territory, LEFT PCA thalamoperforate territory, or both. Atrophy and small vessel disease. No posttraumatic sequelae in the cervical spine. Moderate to severe stenosis at C5-6. Multilevel  foraminal narrowing due to uncinate spurring and facet arthropathy. Electronically Signed   By:  Staci Righter M.D.   On: 12/09/2015 18:04    Assessment: 82 y.o. female with multiple risk factors for stroke presenting with multifocal small left cerebral infarctions, as described above.  Stroke Risk Factors - diabetes mellitus, family history, hyperlipidemia and hypertension  Plan: 1. HgbA1c, fasting lipid panel 2. MRA  of the brain without contrast 3. PT consult, OT consult, Speech consult 4. Echocardiogram 5. Carotid dopplers 6. Prophylactic therapy-Antiplatelet med: Aspirin  7. Risk factor modification 8. Telemetry monitoring  C.R. Nicole Kindred, MD [facility] [phone]  12/09/2015, 10:57 PM

## 2015-12-10 ENCOUNTER — Inpatient Hospital Stay (HOSPITAL_COMMUNITY): Payer: Medicare Other

## 2015-12-10 DIAGNOSIS — I6789 Other cerebrovascular disease: Secondary | ICD-10-CM

## 2015-12-10 DIAGNOSIS — I638 Other cerebral infarction: Secondary | ICD-10-CM

## 2015-12-10 DIAGNOSIS — I639 Cerebral infarction, unspecified: Secondary | ICD-10-CM

## 2015-12-10 LAB — LIPID PANEL
Cholesterol: 327 mg/dL — ABNORMAL HIGH (ref 0–200)
HDL: 60 mg/dL (ref >40)
LDL Cholesterol: 237 mg/dL — ABNORMAL HIGH (ref 0–99)
Total CHOL/HDL Ratio: 5.5 RATIO
Triglycerides: 151 mg/dL — ABNORMAL HIGH (ref <150)
VLDL: 30 mg/dL (ref 0–40)

## 2015-12-10 LAB — GLUCOSE, CAPILLARY
Glucose-Capillary: 101 mg/dL — ABNORMAL HIGH (ref 65–99)
Glucose-Capillary: 140 mg/dL — ABNORMAL HIGH (ref 65–99)
Glucose-Capillary: 140 mg/dL — ABNORMAL HIGH (ref 65–99)
Glucose-Capillary: 175 mg/dL — ABNORMAL HIGH (ref 65–99)

## 2015-12-10 LAB — ECHOCARDIOGRAM COMPLETE
Height: 64.5 in
Weight: 2587.2 oz

## 2015-12-10 MED ORDER — OXYCODONE-ACETAMINOPHEN 5-325 MG PO TABS: 1.0000 | ORAL_TABLET | ORAL | Status: DC | PRN

## 2015-12-10 MED ORDER — EZETIMIBE 10 MG PO TABS
10.0000 mg | ORAL_TABLET | Freq: Every day | ORAL | Status: DC
  Administered 2015-12-10 – 2015-12-12 (×3): 10 mg via ORAL
  Filled 2015-12-10 (×3): qty 1

## 2015-12-10 MED ORDER — ASPIRIN EC 325 MG PO TBEC
325.0000 mg | DELAYED_RELEASE_TABLET | Freq: Every day | ORAL | Status: DC
  Administered 2015-12-10 – 2015-12-12 (×3): 325 mg via ORAL
  Filled 2015-12-10 (×3): qty 1

## 2015-12-10 NOTE — Progress Notes (Signed)
STROKE TEAM PROGRESS NOTE   HISTORY OF PRESENT ILLNESS NANCYANN PADMORE is an 82 y.o. female of a history of hypertension, diabetes mellitus and hyperlipidemia presenting with new onset right-sided weakness and slurred speech. Patient's initial symptoms began with right lower extremity weakness on 12/05/2015. She was seen at [facility]. CT scan of the head was unremarkable. She began to have speech changes on 12/08/2015 and had clear weakness of right upper and lower extremity as well as her speech when she woke up this morning. She's been taking aspirin daily. MRI showed multiple areas of acute infarction involving the left basal ganglia, deep white matter and thalamus.  LSN: 12/05/2015 tPA Given: No: P.m. time window for treatment consideration mRankin:   SUBJECTIVE (INTERVAL HISTORY) No major events overnight   OBJECTIVE Temp:  [98 F (36.7 C)-99 F (37.2 C)] 99 F (37.2 C) (04/15 0900) Pulse Rate:  [57-67] 67 (04/15 0900) Cardiac Rhythm:  [-] Normal sinus rhythm (04/15 0700) Resp:  [12-20] 20 (04/15 0900) BP: (101-183)/(34-72) 142/61 mmHg (04/15 0900) SpO2:  [93 %-98 %] 94 % (04/15 0900) Weight:  [71.124 kg (156 lb 12.8 oz)-73.347 kg (161 lb 11.2 oz)] 73.347 kg (161 lb 11.2 oz) (04/15 0111)  CBC:  Recent Labs Lab 12/09/15 1312 12/09/15 1330  WBC 11.5*  --   NEUTROABS 6.4  --   HGB 11.2* 12.9  HCT 35.0* 38.0  MCV 73.7*  --   PLT 323  --     Basic Metabolic Panel:  Recent Labs Lab 12/09/15 1312 12/09/15 1330  NA 135 136  K 3.6 3.3*  CL 99* 98*  CO2 22  --   GLUCOSE 158* 144*  BUN 33* 33*  CREATININE 1.29* 1.30*  CALCIUM 8.8*  --     Lipid Panel:    Component Value Date/Time   CHOL 327* 12/10/2015 0648   TRIG 151* 12/10/2015 0648   HDL 60 12/10/2015 0648   CHOLHDL 5.5 12/10/2015 0648   VLDL 30 12/10/2015 0648   LDLCALC 237* 12/10/2015 0648   HgbA1c:  Lab Results  Component Value Date   HGBA1C 6.1* 07/14/2014   Urine Drug Screen: No results  found for: LABOPIA, COCAINSCRNUR, LABBENZ, AMPHETMU, THCU, LABBARB    IMAGING  Mr Brain Wo Contrast 12/09/2015   Multifocal areas of LEFT hemisphere infarction affecting the basal ganglia, deep white matter, and thalamus. This could reflect ischemia within the LEFT MCA lenticulostriate territory, LEFT PCA thalamoperforate territory, or both.   Mr Cervical Spine Wo Contrast 12/09/2015   No posttraumatic sequelae in the cervical spine. Moderate to severe stenosis at C5-6. Multilevel foraminal narrowing due to uncinate spurring and facet arthropathy.     PHYSICAL EXAM Mental Status -  Level of arousal and orientation to time, place and person were intact. Language exam showed intact repetition, but partial expressive aphasia and difficulty with complex commands, naming 3/4.   Cranial Nerves II - XII - II - left homonymous hemianopia. III, IV, VI - Extraocular movements intact. V - Facial sensation intact bilaterally. VII - Facial movement intact bilaterally. VIII - Hearing & vestibular intact bilaterally. X - Palate elevates symmetrically. XI - Chin turning & shoulder shrug intact bilaterally. XII - Tongue protrusion intact.  Motor Strength - The patient's strength was normal in all extremities and pronator drift was absent. Bulk was normal and fasciculations were absent.  Motor Tone - Muscle tone was assessed at the neck and appendages and was normal.  Reflexes - The patient's reflexes were 1+ in  all extremities and he had no pathological reflexes.  Sensory - Light touch, temperature/pinprick were assessed and were symmetrical.   Coordination - The patient had normal movements in the hands with no ataxia or dysmetria. Tremor was absent.  Gait and Station - not tested due to safety concerns.      ASSESSMENT/PLAN Ms. PEGGIE SHINES is a 82 y.o. female with history of hypertension, coronary artery disease, previous MI, diabetes mellitus, and hyperlipidemia with statin  allergy presenting with right hemiparesis and dysarthria. She did not receive IV t-PA due to late presentation.  Strokes:  Dominant infarct - embolic from an unknown source.  Resultant    MRI - Multifocal areas of LEFT hemisphere infarction affecting the basal ganglia, deep white matter, and thalamus.  MRA  pending  Carotid Doppler - 1-39% ICA plaquing. Vertebral artery flow antegrade with abnormal waveform on the right.   2D Echo - EF 60-65%. No cardiac source of emboli identified.  LDL - 237  HgbA1c pending  VTE prophylaxis - SCDs  Diet heart healthy/carb modified Room service appropriate?: Yes; Fluid consistency:: Thin  aspirin 81 mg daily prior to admission, now on aspirin 325 mg daily  Patient counseled to be compliant with her antithrombotic medications  Ongoing aggressive stroke risk factor management  Therapy recommendations: Pending  Disposition: Pending  Hypertension  Stable  Permissive hypertension (OK if < 220/120) but gradually normalize in 5-7 days  Hyperlipidemia  Home meds:  No lipid lowering medications prior to admission. Statin allergy.  LDL 237, goal < 70  Statin allergy  Try Zetia   Diabetes  HgbA1c pending, goal < 7.0  Uncontrolled   Other Stroke Risk Factors  Advanced age  Family hx stroke (Mother)  Coronary artery disease   Other Active Problems  Hypokalemia  Renal insufficiency  PLAN  Consider TEE and possible loop Monday - ? If patient is a candidate for anticoagulation.  Hospital day # 1  Mikey Bussing [facility] Pager [phone] 12/10/2015, 11:36 AM  ATTENDING NOTE: Patient was seen and examined by me personally. Documentation reflects findings. The laboratory and radiographic studies reviewed by me. ROS completed by me personally and pertinent positives fully documented  Condition: stable  Assessment and plan completed by me personally and fully documented  above. Plans/Recommendations include:     Stroke work-up ongoing  Discussed C-spine findings with patient and advised outpatient evaulation  Discussed the significant risk facto findings  SIGNED BY: Dr. Elissa Hefty     To contact Stroke Continuity provider, please refer to [email or web address] After hours, contact General Neurology

## 2015-12-10 NOTE — Evaluation (Signed)
Occupational Therapy Evaluation Patient Details Name: [name] MRN: [id number] DOB: 1926/12/22 Today's Date: 12/10/2015    History of Present Illness Pt adm with rt side weakness and speech difficulties. MRI showed multifocal areas of LEFT hemisphere infarction affecting the basal ganglia, deep white matter, and thalamus. PMH - CABG, MI, HTN, DM, back surgery x 2, rt shoulder surgery   Clinical Impression   Pt reports she was independent with ADLs PTA. Currently pt overall mod assist for stand pivot transfers and ADLs. Pt with decreased gross motor coordination and sensation in her R UE impacting her independence and safety with ADLs. Pt would be appropriate and benefit from CIR level therapies, however, pt/family may prefer SNF closer to home. Pt would benefit from continued skilled OT to address established goals.    Follow Up Recommendations  CIR;Supervision/Assistance - 24 hour (Pt/family leaning toward SNF ([facility]))    Equipment Recommendations  Other (comment) (TBD at next venue)    Recommendations for Other Services Rehab consult     Precautions / Restrictions Precautions Precautions: Fall Restrictions Weight Bearing Restrictions: No      Mobility Bed Mobility Overal bed mobility: Needs Assistance Bed Mobility: Supine to Sit     Supine to sit: Mod assist;HOB elevated     General bed mobility comments: Pt on BSC upon arrival, returned to chair at end of session.  Transfers Overall transfer level: Needs assistance Equipment used: 1 person hand held assist Transfers: Sit to/from [facility] Sit to Stand: Min assist Stand pivot transfers: Mod assist       General transfer comment: Pt able to push up from [facility] with bil UEs then reach for chair arm with one hand; hand held assist provided to other UE.    Balance Overall balance assessment: Needs assistance Sitting-balance support: Feet supported;No upper extremity  supported Sitting balance-Leahy Scale: Fair Sitting balance - Comments: Sat EOB x 6-8 minutes. Initially pt sitting with supervision using UE support. Pt then began to have posterior and rt lean requiring intermittent min A   Standing balance support: Bilateral upper extremity supported;During functional activity Standing balance-Leahy Scale: Poor                              ADL Overall ADL's : Needs assistance/impaired Eating/Feeding: Set up;Sitting   Grooming: Set up;Sitting;Supervision/safety   Upper Body Bathing: Set up;Supervision/ safety;Sitting   Lower Body Bathing: Moderate assistance;Sit to/from stand   Upper Body Dressing : Set up;Supervision/safety;Sitting   Lower Body Dressing: Moderate assistance;Sit to/from stand   Toilet Transfer: Moderate assistance;Stand-pivot;BSC   Toileting- Clothing Manipulation and Hygiene: Minimal assistance;Sit to/from stand Toileting - Clothing Manipulation Details (indicate cue type and reason): Pt able to perform peri care but therapist assisted to make sure pt was clean after BM     Functional mobility during ADLs: Moderate assistance (for stand pivot transfer) General ADL Comments: Discussed post acute rehab options; pt agreeable. Considering [facility] becuase it is close to home.     Vision Vision Assessment?: No apparent visual deficits   Perception     Praxis      Pertinent Vitals/Pain Pain Assessment: No/denies pain     Hand Dominance Right   Extremity/Trunk Assessment Upper Extremity Assessment Upper Extremity Assessment: Generalized weakness;RUE deficits/detail RUE Deficits / Details: Pt reports arm feels numb and is dull to touch. RUE Sensation: decreased light touch RUE Coordination: decreased gross motor   Lower Extremity Assessment Lower  Extremity Assessment: Defer to PT evaluation    Cervical / Trunk Assessment Cervical / Trunk Assessment: Normal   Communication  Communication Communication: HOH   Cognition Arousal/Alertness: Awake/alert Behavior During Therapy: WFL for tasks assessed/performed Overall Cognitive Status: Within Functional Limits for tasks assessed                     General Comments       Exercises       Shoulder Instructions      Home Living Family/patient expects to be discharged to:: Private residence Living Arrangements: Alone Available Help at Discharge: Family;Available PRN/intermittently Type of Home: House Home Access: Stairs to enter [employer] of Steps: 3 Entrance Stairs-Rails: Right Home Layout: One level     Bathroom Shower/Tub: Occupational psychologist: Handicapped height     Home Equipment: [place] - single point;Wheelchair - Brewing technologist      Lives With: Alone (daughter lives next door)    Prior Functioning/Environment Level of Independence: Independent             OT Diagnosis: Generalized weakness;Hemiplegia dominant side   OT Problem List: Decreased strength;Impaired balance (sitting and/or standing);Decreased coordination;Decreased safety awareness;Decreased knowledge of use of DME or AE;Decreased knowledge of precautions;Impaired sensation   OT Treatment/Interventions: Self-care/ADL training;Energy conservation;DME and/or AE instruction;Therapeutic activities;Neuromuscular education;Patient/family education;Balance training    OT Goals(Current goals can be found in the care plan section) Acute Rehab OT Goals Patient Stated Goal: Return to normal OT Goal Formulation: With patient Time For Goal Achievement: 12/24/15 Potential to Achieve Goals: Good ADL Goals Pt Will Perform Grooming: with min guard assist;standing Pt Will Perform Upper Body Bathing: with supervision;sitting Pt Will Perform Lower Body Bathing: with min guard assist;sit to/from stand Pt Will Transfer to Toilet: with min assist;ambulating Pt Will Perform Toileting - Clothing  Manipulation and hygiene: with min guard assist;sit to/from stand  OT Frequency: Min 2X/week   Barriers to D/C:            Co-evaluation              End of Session Nurse Communication: Other (comment);Mobility status (pt able to have BM and void)  Activity Tolerance: Patient tolerated treatment well Patient left: in chair;with call bell/phone within reach;with chair alarm set;with family/visitor present   Time: 1611-1630 OT Time Calculation (min): 19 min Charges:  OT General Charges $OT Visit: 1 Procedure OT Evaluation $OT Eval Moderate Complexity: 1 Procedure G-Codes:     Binnie Kand M.S., OTR/L Pager: [phone]  12/10/2015, 5:06 PM

## 2015-12-10 NOTE — Progress Notes (Signed)
OT Cancellation Note  Patient Details Name: [name] MRN: [id number] DOB: 02/25/1927   Cancelled Treatment:    Reason Eval/Treat Not Completed: Other (comment);Patient at procedure or test/ unavailable (SLP currently with pt; transport present to take pt to MRI). Will follow up for OT eval as time allows.  Binnie Kand M.S., OTR/L Pager: [phone]  12/10/2015, 1:52 PM

## 2015-12-10 NOTE — Progress Notes (Signed)
VASCULAR LAB PRELIMINARY  PRELIMINARY  PRELIMINARY  PRELIMINARY  Carotid duplex completed.    Preliminary report:  1-39% ICA plaquing.  Right vertebral artery flow is antegrade with an abnormal waveform.  Left vertebral artery flow is antegrade.   Liandro Thelin, RVT 12/10/2015, 10:49 AM

## 2015-12-10 NOTE — Progress Notes (Signed)
Patient ID: [name], female   DOB: 1927-01-05, 82 y.o.   MRN: [id number]    PROGRESS NOTE    [name]  [id number] DOB: [birth year] DOA: 12/09/2015  PCP: Rochel Brome, MD   Outpatient Specialists:   Brief Narrative:  82 y.o. female with hypertension, diabetes mellitus and hyperlipidemia presenting with new onset right-sided weakness and slurred speech. Patient's initial symptoms began with right lower extremity weakness on 12/05/2015.  Assessment & Plan:   Principal Problem:   CVA (cerebral infarction) - Multifocal areas of LEFT hemisphere infarction affecting the basal ganglia, deep white matter, and thalamus.  - ? ischemia within the LEFT MCA lenticulostriate territory, LEFT PCA thalamoperforate territory, or both - ECHO with EF 123456, grade I diastolic CHF - LDL 123XX123, cholesterol 327  - PT eval done, recommend CIR vs SNF - CIR consult placed  - statin and aspirin to be continues  - A1C pending - BP control   Active Problems:   Essential hypertension - SBP in 150's this AM - continue with Lisinopril     Coronary atherosclerosis - continue with aspirin and statin     DM (diabetes mellitus) ([place]) with complications of neuropathy, CKD stage II - continue SSI for now - A1C pending     CKD stage II with baseline GFR in 70's in the past year - Cr slightly upon admission - encouraged PO intake - Cr still 1.3 - repeat BMP in AM    Hypokalemia - supplement and repeat BMP in AM   DVT prophylaxis: SCD's Code Status: Full  Family Communication: Patient and daughter at bedside  Disposition Plan: inpatient rehab vs SNF by 4/17  Consultants:   Neurology  PT  Procedures:   None  Antimicrobials:   None   Subjective: Pt reports feeling better.   Objective: Filed Vitals:   12/10/15 0724 12/10/15 0900 12/10/15 1357 12/10/15 1747  BP: 136/72 142/61 125/56 157/48  Pulse: 64 67 71 67  Temp:  99 F (37.2 C) 99.3 F (37.4 C) 97.2 F (36.2 C)    TempSrc:  Oral Oral Oral  Resp: 18 20 20 20   Height:      Weight:      SpO2: 98% 94% 98% 100%    Intake/Output Summary (Last 24 hours) at 12/10/15 1852 Last data filed at 12/10/15 1400  Gross per 24 hour  Intake      0 ml  Output      7 ml  Net     -7 ml   Filed Weights   12/09/15 2150 12/10/15 0111  Weight: 71.124 kg (156 lb 12.8 oz) 73.347 kg (161 lb 11.2 oz)    Examination:  General exam: Appears calm and comfortable  Respiratory system: Clear to auscultation. Respiratory effort normal. Cardiovascular system: S1 & S2 heard, RRR. No rubs, gallops or clicks. No pedal edema. Gastrointestinal system: Abdomen is nondistended, soft and nontender.  Central nervous system: Alert and oriented, slightly reduced perception of tactile sensation over the right side of the face compared to the left, minimal dysarthria, mild drift of right lower extremity with moderate proximal weakness, reduced perception of tactile sensation over right extremities compared to left extremities.  Data Reviewed: I have personally reviewed following labs and imaging studies  CBC:  Recent Labs Lab 12/09/15 1312 12/09/15 1330  WBC 11.5*  --   NEUTROABS 6.4  --   HGB 11.2* 12.9  HCT 35.0* 38.0  MCV 73.7*  --   PLT 323  --  Basic Metabolic Panel:  Recent Labs Lab 12/09/15 1312 12/09/15 1330  NA 135 136  K 3.6 3.3*  CL 99* 98*  CO2 22  --   GLUCOSE 158* 144*  BUN 33* 33*  CREATININE 1.29* 1.30*  CALCIUM 8.8*  --    Liver Function Tests:  Recent Labs Lab 12/09/15 1312  AST 28  ALT 25  ALKPHOS 58  BILITOT 0.4  PROT 6.5  ALBUMIN 3.6   Coagulation Profile:  Recent Labs Lab 12/09/15 1312  INR 1.02   CBG:  Recent Labs Lab 12/09/15 1317 12/09/15 2215 Jan 06, 2016 0630 06-Jan-2016 1112 01/06/16 1601  GLUCAP 151* 156* 101* 140* 140*   Lipid Profile:  Recent Labs  01/06/2016 0648  CHOL 327*  HDL 60  LDLCALC 237*  TRIG 151*  CHOLHDL 5.5   Urine analysis:    Component  Value Date/Time   COLORURINE YELLOW 12/09/2015 1819   APPEARANCEUR CLOUDY* 12/09/2015 1819   LABSPEC 1.018 12/09/2015 1819   PHURINE 5.5 12/09/2015 1819   GLUCOSEU NEGATIVE 12/09/2015 1819   HGBUR NEGATIVE 12/09/2015 1819   BILIRUBINUR NEGATIVE 12/09/2015 1819   KETONESUR NEGATIVE 12/09/2015 1819   PROTEINUR NEGATIVE 12/09/2015 1819   UROBILINOGEN 0.2 03/20/2013 1147   NITRITE NEGATIVE 12/09/2015 1819   LEUKOCYTESUR MODERATE* 12/09/2015 1819   Radiology Studies: Mr Jodene Nam Head Wo Contrast 2016-01-06  75% stenosis of the dominant LEFT vertebral, potentially flow reducing and/or symptomatic. No anterior circulation disease affecting the LEFT ICA or LEFT MCA.  Mr Cervical Spine Wo Contrast 12/09/2015 Multifocal areas of LEFT hemisphere infarction affecting the basal ganglia, deep white matter, and thalamus. This could reflect ischemia within the LEFT MCA lenticulostriate territory, LEFT PCA thalamoperforate territory, or both. Atrophy and small vessel disease. No posttraumatic sequelae in the cervical spine. Moderate to severe stenosis at C5-6. Multilevel foraminal narrowing due to uncinate spurring and facet arthropathy.    Scheduled Meds: . aspirin EC  325 mg Oral Daily  . ezetimibe  10 mg Oral Daily  . insulin aspart  0-9 Units Subcutaneous TID WC  . lisinopril  20 mg Oral Daily  . LORazepam  1 mg Oral QHS  . omega-3 acid ethyl esters  1 g Oral Daily  . pantoprazole  40 mg Oral Daily  . sertraline  100 mg Oral QHS  . timolol  1 drop Both Eyes q morning - 10a   Continuous Infusions:    LOS: 1 day    Time spent: 20 minutes   Faye Ramsay, MD [facility] Pager [phone]  If 7PM-7AM, please contact night-coverage [email or web address] Password [email or web address] 01-06-2016, 6:52 PM

## 2015-12-10 NOTE — Evaluation (Signed)
Physical Therapy Evaluation Patient Details Name: NETHANIA MESKIMEN MRN: [id number] DOB: 1927-06-22 Today's Date: 12/10/2015   History of Present Illness  Pt adm with rt side weakness and speech difficulties. MRI showed multifocal areas of LEFT hemisphere infarction affecting the basal ganglia, deep white matter, and thalamus. PMH - CABG, MI, HTN, DM, back surgery x 2, rt shoulder surgery  Clinical Impression  Pt admitted with above diagnosis and presents to PT with functional limitations due to deficits listed below (See PT problem list). Pt needs skilled PT to maximize independence and safety to allow discharge to post acute rehab. Pt is appropriate for CIR level therapies but has been to [facility] before and may prefer to go there because it is closer to home.     Follow Up Recommendations CIR (Pt/family think they prefer [facility].)    Equipment Recommendations  Other (comment) (To be assessed)    Recommendations for Other Services       Precautions / Restrictions Precautions Precautions: Fall Restrictions Weight Bearing Restrictions: No      Mobility  Bed Mobility Overal bed mobility: Needs Assistance Bed Mobility: Supine to Sit     Supine to sit: Mod assist;HOB elevated     General bed mobility comments: Assist to elevate trunk and bring hips to EOB  Transfers Overall transfer level: Needs assistance Equipment used: 1 person hand held assist (Pt holding my bilateral forearms) Transfers: Sit to/from [employer] Transfers Sit to Stand: Mod assist Stand pivot transfers: Mod assist       General transfer comment: Assist to bring hips up, block rt knee to prevent buckling, and for balance.   Ambulation/Gait                Stairs            Wheelchair Mobility    Modified Rankin (Stroke Patients Only) Modified Rankin (Stroke Patients Only) Pre-Morbid Rankin Score: No symptoms Modified Rankin: Severe disability     Balance Overall  balance assessment: Needs assistance Sitting-balance support: Bilateral upper extremity supported;Feet supported Sitting balance-Leahy Scale: Poor Sitting balance - Comments: Sat EOB x 6-8 minutes. Initially pt sitting with supervision using UE support. Pt then began to have posterior and rt lean requiring intermittent min A                                     Pertinent Vitals/Pain Pain Assessment: No/denies pain    Home Living Family/patient expects to be discharged to:: Private residence Living Arrangements: Alone Available Help at Discharge: Family;Available PRN/intermittently Type of Home: House Home Access: Stairs to enter Entrance Stairs-Rails: Right Entrance Stairs-Number of Steps: 3 Home Layout: One level Home Equipment: Cane - single point;Wheelchair - manual      Prior Function Level of Independence: Independent               Hand Dominance   Dominant Hand: Right    Extremity/Trunk Assessment   Upper Extremity Assessment: Defer to OT evaluation           Lower Extremity Assessment: RLE deficits/detail RLE Deficits / Details: 4/5 to manual muscle testing but functionally performs at <3/5. Leg gives way with weight bearing       Communication   Communication: HOH  Cognition Arousal/Alertness: Awake/alert Behavior During Therapy: WFL for tasks assessed/performed Overall Cognitive Status: Within Functional Limits for tasks assessed  General Comments      Exercises        Assessment/Plan    PT Assessment Patient needs continued PT services  PT Diagnosis Difficulty walking;Hemiplegia dominant side   PT Problem List Decreased strength;Decreased balance;Decreased mobility;Decreased coordination;Decreased knowledge of use of DME  PT Treatment Interventions DME instruction;Gait training;Functional mobility training;Therapeutic activities;Therapeutic exercise;Balance training;Patient/family education   PT  Goals (Current goals can be found in the Care Plan section) Acute Rehab PT Goals Patient Stated Goal: Walk PT Goal Formulation: With patient/family Time For Goal Achievement: 12/24/15 Potential to Achieve Goals: Good    Frequency Min 4X/week   Barriers to discharge Decreased caregiver support;Inaccessible home environment Lives alone and has stairs to get in house    Co-evaluation               End of Session Equipment Utilized During Treatment: Gait belt Activity Tolerance: Patient tolerated treatment well Patient left: in chair;with call bell/phone within reach;with chair alarm set;with family/visitor present Nurse Communication: Mobility status         Time: 1535-1600 PT Time Calculation (min) (ACUTE ONLY): 25 min   Charges:   PT Evaluation $PT Eval Moderate Complexity: 1 Procedure PT Treatments $Therapeutic Activity: 8-22 mins   PT G Codes:        Chayah Mckee 12/19/15, 4:41 PM [employer] PT [phone]

## 2015-12-10 NOTE — Progress Notes (Signed)
Echocardiogram 2D Echocardiogram has been performed.  Tresa Res 12/10/2015, 10:29 AM

## 2015-12-10 NOTE — Progress Notes (Signed)
PT Cancellation Note  Patient Details Name: [name] MRN: [id number] DOB: 1927-04-27   Cancelled Treatment:    Reason Eval/Treat Not Completed: Patient at procedure or test/unavailable.Will try again later.   [name] 12/10/2015, 2:08 PM [facility] PT [phone]

## 2015-12-10 NOTE — Evaluation (Signed)
Speech Language Pathology Evaluation Patient Details Name: [name] MRN: [id number] DOB: 05-21-1927 Today's Date: 12/10/2015 Time: [id number] SLP Time Calculation (min) (ACUTE ONLY): 23 min  Problem List:  Patient Active Problem List   Diagnosis Date Noted  . CVA (cerebral infarction) 12/09/2015  . HLD (hyperlipidemia) 12/09/2015  . DM (diabetes mellitus) ([place]) 12/09/2015  . Cervical stenosis of spinal canal 12/09/2015  . Lumbar canal stenosis 07/14/2014  . HYPERLIPIDEMIA 11/25/2008  . Essential hypertension 11/25/2008  . Coronary atherosclerosis 11/25/2008  . DYSPNEA 11/25/2008   Past Medical History:  Past Medical History  Diagnosis Date  . CAD (coronary artery disease)   . HTN (hypertension)   . Dyslipidemia   . Cardiomyopathy, ischemic     EF 40% at the time cath  . Arthritis   . Anemia     hx  . Myocardial infarction (HCC)     mild  . Diabetes mellitus without complication (HCC)     borderline no meds  . Depression   . Urinary frequency   . GERD (gastroesophageal reflux disease)    Past Surgical History:  Past Surgical History  Procedure Laterality Date  . Coronary artery bypass graft      LIMA to the LAD, SVG to diagonal, SVG to obtuse marginal, SVG to PDA..  2008  . Shoulder arthroscopy w/ rotator cuff repair Right     04  . Appendectomy    . Tonsillectomy    . Cesarean section      x2  . Lumbar laminectomy/decompression microdiscectomy N/A 05/01/2013    Procedure: LUMBAR LAMINECTOMY/DECOMPRESSION MICRODISCECTOMY 2 LEVELS;  Surgeon: Winfield Cunas, MD;  Location: [facility];  Service: Neurosurgery;  Laterality: N/A;  Lumbar Three-Four, Four-Five Laminectomies   . Lumbar laminectomy/decompression microdiscectomy N/A 07/14/2014    Procedure: LUMBAR LAMINECTOMY/DECOMPRESSION MICRODISCECTOMY 1 LEVEL LUMBAR TWO-THREE;  Surgeon: Ashok Pall, MD;  Location: [facility];  Service: Neurosurgery;  Laterality: N/A;   HPI:  82 y.o. woman with a history of  CAD S/P CABG, HTN, HLD, DM, and systolic heart failure who presents for evaluation of right sided weakness and slurred speech. MRI multifocal areas of LEFT hemisphere infarction affecting the basal ganglia, deep white matter, and thalamus. This could reflect   Assessment / Plan / Recommendation Clinical Impression  Prior to admission pt lived independently, responsible for medications, finances with daughter next door. Verbal expression during incident included neologisms as described by daughters and has "improved significantly" today. Dysarthria marked by decreased vocal intensity with phonemic imprecision and slight dysfluency noted initially. Expressive language in sentences is functional, suspect difficulty with longer and higher complexity conversation. Pt would benefit from continued ST to increase independence for all ADL's.      SLP Assessment  Patient needs continued Speech Lanaguage Pathology Services    Follow Up Recommendations   (pt/family prefer [facility] nursing home)    Frequency and Duration min 2x/week  2 weeks      SLP Evaluation Prior Functioning  Cognitive/Linguistic Baseline: Within functional limits Type of Home: House  Lives With: Alone (daughter lives next door) Available Help at Discharge: Family   Cognition  Overall Cognitive Status: Within Functional Limits for tasks assessed Arousal/Alertness: Awake/alert Orientation Level: Oriented X4 Attention: Sustained Sustained Attention: Appears intact Memory:  (scored WNL on Cognistat subtest) Awareness: Appears intact Problem Solving: Appears intact Safety/Judgment: Appears intact    Comprehension  Auditory Comprehension Overall Auditory Comprehension: Appears within functional limits for tasks assessed Commands: Within Functional Limits Conversation: Simple Visual Recognition/Discrimination  Discrimination: Not tested Reading Comprehension Reading Status: Not tested    Expression Expression Primary Mode  of Expression: Verbal Verbal Expression Overall Verbal Expression: Impaired Initiation: No impairment Level of Generative/Spontaneous Verbalization: Conversation Repetition: Impaired Level of Impairment: Sentence level Naming: Impairment Responsive: Not tested Confrontation: Impaired Convergent: 75-100% accurate Divergent: Not tested Verbal Errors:  (not observed ) Pragmatics: No impairment Written Expression Dominant Hand: Right Written Expression: Not tested   Oral / Motor  Oral Motor/Sensory Function Overall Oral Motor/Sensory Function: Within functional limits Motor Speech Overall Motor Speech: Impaired Respiration: Within functional limits Phonation: Low vocal intensity Resonance: Within functional limits Articulation: Within functional limitis Intelligibility: Intelligibility reduced Word: 75-100% accurate Phrase: 75-100% accurate Sentence: 75-100% accurate Conversation: 75-100% accurate Motor Planning: Impaired Level 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

## 2015-12-11 LAB — GLUCOSE, CAPILLARY
Glucose-Capillary: 169 mg/dL — ABNORMAL HIGH (ref 65–99)
Glucose-Capillary: 94 mg/dL (ref 65–99)
Glucose-Capillary: 116 mg/dL — ABNORMAL HIGH (ref 65–99)
Glucose-Capillary: 138 mg/dL — ABNORMAL HIGH (ref 65–99)

## 2015-12-11 LAB — URINALYSIS, ROUTINE W REFLEX MICROSCOPIC
Bilirubin Urine: NEGATIVE
Glucose, UA: NEGATIVE mg/dL
Hgb urine dipstick: NEGATIVE
Ketones, ur: NEGATIVE mg/dL
Leukocytes, UA: NEGATIVE
Nitrite: NEGATIVE
Protein, ur: NEGATIVE mg/dL
Specific Gravity, Urine: 1.014 (ref 1.005–1.030)
pH: 6 (ref 5.0–8.0)

## 2015-12-11 LAB — BASIC METABOLIC PANEL
Anion gap: 11 (ref 5–15)
BUN: 26 mg/dL — ABNORMAL HIGH (ref 6–20)
CO2: 24 mmol/L (ref 22–32)
Calcium: 9 mg/dL (ref 8.9–10.3)
Chloride: 99 mmol/L — ABNORMAL LOW (ref 101–111)
Creatinine, Ser: 0.99 mg/dL (ref 0.44–1.00)
GFR calc Af Amer: 58 mL/min — ABNORMAL LOW (ref >60)
GFR calc non Af Amer: 50 mL/min — ABNORMAL LOW (ref >60)
Glucose, Bld: 140 mg/dL — ABNORMAL HIGH (ref 65–99)
Potassium: 3.9 mmol/L (ref 3.5–5.1)
Sodium: 134 mmol/L — ABNORMAL LOW (ref 135–145)

## 2015-12-11 LAB — CBC
HCT: 34.8 % — ABNORMAL LOW (ref 36.0–46.0)
Hemoglobin: 10.7 g/dL — ABNORMAL LOW (ref 12.0–15.0)
MCH: 22.3 pg — ABNORMAL LOW (ref 26.0–34.0)
MCHC: 30.7 g/dL (ref 30.0–36.0)
MCV: 72.5 fL — ABNORMAL LOW (ref 78.0–100.0)
Platelets: 342 10*3/uL (ref 150–400)
RBC: 4.8 MIL/uL (ref 3.87–5.11)
RDW: 16.3 % — ABNORMAL HIGH (ref 11.5–15.5)
WBC: 13.7 10*3/uL — ABNORMAL HIGH (ref 4.0–10.5)

## 2015-12-11 NOTE — Progress Notes (Signed)
Inpatient Rehabilitation  PT and OT are recommending IP Rehab, though note family/pt. may prefer [facility] SNF.  Consult has been ordered.  Admissions coordinator will follow up tomorrow after completion of consult.   [facility] Admissions Coordinator Cell [phone] Office [phone]

## 2015-12-11 NOTE — Progress Notes (Signed)
Patient ID: [name], female   DOB: 03-30-1927, 82 y.o.   MRN: [id number]    PROGRESS NOTE    ANAHID MUSER  [id number] DOB: [birth year] DOA: 12/09/2015  PCP: Rochel Brome, MD   Outpatient Specialists:   Brief Narrative:  82 y.o. female with hypertension, diabetes mellitus and hyperlipidemia presenting with new onset right-sided weakness and slurred speech. Patient's initial symptoms began with right lower extremity weakness on 12/05/2015.  Assessment & Plan:   Principal Problem:   CVA (cerebral infarction) - Multifocal areas of LEFT hemisphere infarction affecting the basal ganglia, deep white matter, and thalamus.  - ? ischemia within the LEFT MCA lenticulostriate territory, LEFT PCA thalamoperforate territory, or both - ECHO with EF 123456, grade I diastolic CHF - LDL 123XX123, cholesterol 327  - PT eval done, recommend CIR vs SNF - CIR consult placed  - statin and aspirin to be continues  - ? TEE in AM  Active Problems:   Essential hypertension - SBP in 150's this AM - continue with Lisinopril     Coronary atherosclerosis - continue with aspirin and statin     DM (diabetes mellitus) ([place]) with complications of neuropathy, CKD stage II - continue SSI for now - A1C still pending     Leukocytosis - unclear etiology - ? UTI but pt with no urinary concerns - urine dark, will ask for repeat UA - CBC in AM    CKD stage II with baseline GFR in 70's in the past year - Cr slightly upon admission - encouraged PO intake - Cr is now WNL  - repeat BMP in AM    Hypokalemia - WNL this AM    Hyponatremia - mild, BMP in AM   DVT prophylaxis: SCD's Code Status: Full  Family Communication: Patient and daughter at bedside  Disposition Plan: inpatient rehab vs SNF by 4/17 or 4/18  Consultants:   Neurology  PT  Procedures:   None  Antimicrobials:   None   Subjective: Pt reports feeling better.   Objective: Filed Vitals:   12/11/15 0200 12/11/15 0554  12/11/15 0934 12/11/15 1357  BP: 159/75 142/52 156/52 130/57  Pulse: 70 59 62 58  Temp: 98.2 F (36.8 C) 98.4 F (36.9 C) 97.9 F (36.6 C) 98.8 F (37.1 C)  TempSrc: Oral Oral Oral Oral  Resp: 16 16 16 18   Height:      Weight:      SpO2: 97% 96% 98% 95%    Intake/Output Summary (Last 24 hours) at 12/11/15 1524 Last data filed at 12/10/15 2145  Gross per 24 hour  Intake      0 ml  Output      1 ml  Net     -1 ml   Filed Weights   12/09/15 2150 12/10/15 0111  Weight: 71.124 kg (156 lb 12.8 oz) 73.347 kg (161 lb 11.2 oz)    Examination:  General exam: Appears calm and comfortable  Respiratory system: Clear to auscultation. Respiratory effort normal. Cardiovascular system: S1 & S2 heard, RRR. No rubs, gallops or clicks. No pedal edema. Gastrointestinal system: Abdomen is nondistended, soft and nontender.  Central nervous system: Alert and oriented, slightly reduced perception of tactile sensation over the right side of the face compared to the left, minimal dysarthria, mild drift of right lower extremity with moderate proximal weakness, reduced perception of tactile sensation over right extremities compared to left extremities.  Data Reviewed: I have personally reviewed following labs and imaging studies  CBC:  Recent Labs Lab 12/09/15 1312 12/09/15 1330 12/11/15 0210  WBC 11.5*  --  13.7*  NEUTROABS 6.4  --   --   HGB 11.2* 12.9 10.7*  HCT 35.0* 38.0 34.8*  MCV 73.7*  --  72.5*  PLT 323  --  [id number]   Basic Metabolic Panel:  Recent Labs Lab 12/09/15 1312 12/09/15 1330 12/11/15 0210  NA 135 136 134*  K 3.6 3.3* 3.9  CL 99* 98* 99*  CO2 22  --  24  GLUCOSE 158* 144* 140*  BUN 33* 33* 26*  CREATININE 1.29* 1.30* 0.99  CALCIUM 8.8*  --  9.0   Liver Function Tests:  Recent Labs Lab 12/09/15 1312  AST 28  ALT 25  ALKPHOS 58  BILITOT 0.4  PROT 6.5  ALBUMIN 3.6   Coagulation Profile:  Recent Labs Lab 12/09/15 1312  INR 1.02   CBG:  Recent  Labs Lab 12-28-15 1112 12/28/2015 1601 28-Dec-2015 2210 12/11/15 0802 12/11/15 1107  GLUCAP 140* 140* 175* 169* 94   Lipid Profile:  Recent Labs  2015/12/28 0648  CHOL 327*  HDL 60  LDLCALC 237*  TRIG 151*  CHOLHDL 5.5   Urine analysis:    Component Value Date/Time   COLORURINE YELLOW 12/09/2015 1819   APPEARANCEUR CLOUDY* 12/09/2015 1819   LABSPEC 1.018 12/09/2015 1819   PHURINE 5.5 12/09/2015 1819   GLUCOSEU NEGATIVE 12/09/2015 1819   HGBUR NEGATIVE 12/09/2015 1819   BILIRUBINUR NEGATIVE 12/09/2015 1819   KETONESUR NEGATIVE 12/09/2015 1819   PROTEINUR NEGATIVE 12/09/2015 1819   UROBILINOGEN 0.2 03/20/2013 1147   NITRITE NEGATIVE 12/09/2015 1819   LEUKOCYTESUR MODERATE* 12/09/2015 1819   Radiology Studies: Mr Jodene Nam Head Wo Contrast 2015-12-28  75% stenosis of the dominant LEFT vertebral, potentially flow reducing and/or symptomatic. No anterior circulation disease affecting the LEFT ICA or LEFT MCA.  Mr Cervical Spine Wo Contrast 12/09/2015 Multifocal areas of LEFT hemisphere infarction affecting the basal ganglia, deep white matter, and thalamus. This could reflect ischemia within the LEFT MCA lenticulostriate territory, LEFT PCA thalamoperforate territory, or both. Atrophy and small vessel disease. No posttraumatic sequelae in the cervical spine. Moderate to severe stenosis at C5-6. Multilevel foraminal narrowing due to uncinate spurring and facet arthropathy.    Scheduled Meds: . aspirin EC  325 mg Oral Daily  . ezetimibe  10 mg Oral Daily  . insulin aspart  0-9 Units Subcutaneous TID WC  . lisinopril  20 mg Oral Daily  . LORazepam  1 mg Oral QHS  . omega-3 acid ethyl esters  1 g Oral Daily  . pantoprazole  40 mg Oral Daily  . sertraline  100 mg Oral QHS  . timolol  1 drop Both Eyes q morning - 10a   Continuous Infusions:    LOS: 2 days    Time spent: 20 minutes   Faye Ramsay, MD [facility] Pager [phone]  If 7PM-7AM, please contact  night-coverage [email or web address] Password [email or web address] 12/11/2015, 3:24 PM

## 2015-12-11 NOTE — NC FL2 (Signed)
[place] LEVEL OF CARE SCREENING TOOL     IDENTIFICATION  Patient Name: [name] Birthdate: 04/15/1927 Sex: female Admission Date (Current Location): 12/09/2015  [facility] and [place] Number:  Herbalist and Address:  [facility] [place] [address], [place], [place] [address]      Provider Number: [id number]  Attending Physician Name and Address:  Theodis Blaze, MD  Relative Name and Phone Number:       Current Level of Care: Hospital Recommended Level of Care: [place] Prior Approval Number:    Date Approved/Denied: 05/06/13 PASRR Number: [id number]  Discharge Plan: SNF    Current Diagnoses: Patient Active Problem List   Diagnosis Date Noted  . CVA (cerebral infarction) 12/09/2015  . HLD (hyperlipidemia) 12/09/2015  . DM (diabetes mellitus) ([place]) 12/09/2015  . Cervical stenosis of spinal canal 12/09/2015  . Lumbar canal stenosis 07/14/2014  . HYPERLIPIDEMIA 11/25/2008  . Essential hypertension 11/25/2008  . Coronary atherosclerosis 11/25/2008  . DYSPNEA 11/25/2008    Orientation RESPIRATION BLADDER Height & Weight     Self, Time, Situation, Place  Normal Continent Weight: 161 lb 11.2 oz (73.347 kg) Height:  5' 4.5" (163.8 cm)  BEHAVIORAL SYMPTOMS/MOOD NEUROLOGICAL BOWEL NUTRITION STATUS      Continent Diet  AMBULATORY STATUS COMMUNICATION OF NEEDS Skin   Limited Assist Verbally Normal                       Personal Care Assistance Level of Assistance  Bathing, Feeding, Dressing Bathing Assistance: Limited assistance Feeding assistance: Independent Dressing Assistance: Limited assistance     Functional Limitations Info  Sight, Hearing, Speech Sight Info: Adequate Hearing Info: Adequate Speech Info: Adequate    SPECIAL CARE FACTORS FREQUENCY  PT (By licensed PT), OT (By licensed OT)                    Contractures      Additional Factors Info  Code Status,  Allergies Code Status Info: FULL Allergies Info: Atorvastatin, Statins, Penicillins           Current Medications (12/11/2015):  This is the current hospital active medication list Current Facility-Administered Medications  Medication Dose Route Frequency Provider Last Rate Last Dose  . acetaminophen (TYLENOL) tablet 650 mg  650 mg Oral Q4H PRN Lily Kocher, MD       Or  . acetaminophen (TYLENOL) suppository 650 mg  650 mg Rectal Q4H PRN Lily Kocher, MD      . aspirin EC tablet 325 mg  325 mg Oral Daily Lily Kocher, MD   325 mg at 12/10/15 0928  . cyclobenzaprine (FLEXERIL) tablet 10 mg  10 mg Oral TID PRN Lily Kocher, MD      . ezetimibe (ZETIA) tablet 10 mg  10 mg Oral Daily David L Rinehuls, PA-C   10 mg at 12/10/15 1659  . HYDROmorphone (DILAUDID) injection 0.5 mg  0.5 mg Intravenous Q4H PRN Lily Kocher, MD      . insulin aspart (novoLOG) injection 0-9 Units  0-9 Units Subcutaneous TID WC Lily Kocher, MD   2 Units at 12/11/15 0803  . lisinopril (PRINIVIL,ZESTRIL) tablet 20 mg  20 mg Oral Daily Lily Kocher, MD   20 mg at 12/10/15 0929  . LORazepam (ATIVAN) tablet 1 mg  1 mg Oral QHS Lily Kocher, MD   1 mg at 12/10/15 2217  . omega-3 acid ethyl esters (LOVAZA) capsule 1 g  1 g Oral Daily Lily Kocher, MD   1 g at 12/10/15 0929  . oxyCODONE-acetaminophen (PERCOCET/ROXICET) 5-325 MG per tablet 1 tablet  1 tablet Oral Q4H PRN Theodis Blaze, MD      . pantoprazole (PROTONIX) EC tablet 40 mg  40 mg Oral Daily Lily Kocher, MD   40 mg at 12/10/15 0929  . senna-docusate (Senokot-S) tablet 1 tablet  1 tablet Oral QHS PRN Lily Kocher, MD      . sertraline (ZOLOFT) tablet 100 mg  100 mg Oral QHS Lily Kocher, MD   100 mg at 12/10/15 2217  . timolol (TIMOPTIC) 0.5 % ophthalmic solution 1 drop  1 drop Both Eyes q morning - 10a Lily Kocher, MD   1 drop at 12/10/15 [id number]     Discharge Medications: Please see discharge summary for a list of discharge medications.  Relevant Imaging  Results:  Relevant Lab Results:   Additional Information SS#: [id number]  Raymondo Band, LCSW

## 2015-12-11 NOTE — Progress Notes (Signed)
STROKE TEAM PROGRESS NOTE   HISTORY OF PRESENT ILLNESS CAROLYNNE ORBAN is an 82 y.o. female of a history of hypertension, diabetes mellitus and hyperlipidemia presenting with new onset right-sided weakness and slurred speech. Patient's initial symptoms began with right lower extremity weakness on 12/05/2015. She was seen at [facility]. CT scan of the head was unremarkable. She began to have speech changes on 12/08/2015 and had clear weakness of right upper and lower extremity as well as her speech when she woke up this morning. She's been taking aspirin daily. MRI showed multiple areas of acute infarction involving the left basal ganglia, deep white matter and thalamus.  LSN: 12/05/2015 tPA Given: No: P.m. time window for treatment consideration mRankin:   SUBJECTIVE (INTERVAL HISTORY) Multiple family members present. The patient is alert and without complaints. Apparently she was independent to admission and left alone.TEE and possible loop discussed for tomorrow.   OBJECTIVE Temp:  [97.2 F (36.2 C)-99.3 F (37.4 C)] 98.4 F (36.9 C) (04/16 0554) Pulse Rate:  [59-71] 59 (04/16 0554) Cardiac Rhythm:  [-] Sinus bradycardia;Bundle branch block (04/16 0700) Resp:  [16-20] 16 (04/16 0554) BP: (125-159)/(48-75) 142/52 mmHg (04/16 0554) SpO2:  [94 %-100 %] 96 % (04/16 0554)  CBC:   Recent Labs Lab 12/09/15 1312 12/09/15 1330 12/11/15 0210  WBC 11.5*  --  13.7*  NEUTROABS 6.4  --   --   HGB 11.2* 12.9 10.7*  HCT 35.0* 38.0 34.8*  MCV 73.7*  --  72.5*  PLT 323  --  [id number]    Basic Metabolic Panel:   Recent Labs Lab 12/09/15 1312 12/09/15 1330 12/11/15 0210  NA 135 136 134*  K 3.6 3.3* 3.9  CL 99* 98* 99*  CO2 22  --  24  GLUCOSE 158* 144* 140*  BUN 33* 33* 26*  CREATININE 1.29* 1.30* 0.99  CALCIUM 8.8*  --  9.0    Lipid Panel:     Component Value Date/Time   CHOL 327* 12/10/2015 0648   TRIG 151* 12/10/2015 0648   HDL 60 12/10/2015 0648   CHOLHDL 5.5  12/10/2015 0648   VLDL 30 12/10/2015 0648   LDLCALC 237* 12/10/2015 0648   HgbA1c:  Lab Results  Component Value Date   HGBA1C 6.1* 07/14/2014   Urine Drug Screen: No results found for: LABOPIA, COCAINSCRNUR, LABBENZ, AMPHETMU, THCU, LABBARB    IMAGING  Mr Brain Wo Contrast 12/09/2015   Multifocal areas of LEFT hemisphere infarction affecting the basal ganglia, deep white matter, and thalamus. This could reflect ischemia within the LEFT MCA lenticulostriate territory, LEFT PCA thalamoperforate territory, or both.   Mr Cervical Spine Wo Contrast 12/09/2015   No posttraumatic sequelae in the cervical spine. Moderate to severe stenosis at C5-6. Multilevel foraminal narrowing due to uncinate spurring and facet arthropathy.    MRA Head 12/10/2015 75% stenosis of the dominant LEFT vertebral, potentially flow reducing and/or symptomatic. No anterior circulation disease affecting the LEFT ICA or LEFT MCA.     PHYSICAL EXAM Neurologic Examination:  Mental Status:  Alert, oriented, thought content appropriate. Speech without evidence of dysarthria or aphasia. Able to follow 3 step commands without difficulty.  Cranial Nerves:  II-bilateral visual fields intact III/IV/VI-Pupils were equal and reacted. Extraocular movements were full.  V/VII-no facial numbness and no facial weakness.  VIII-hearing normal.  X-normal speech and symmetrical palatal movement.  XII-midline tongue extension  Motor: 5/5 strength symmetrical throughout.  Muscle tone normal throughout. Sensory: Intact to light touch in all extremities. Deep  Tendon Reflexes: 2/4 throughout Plantars: Downgoing bilaterally  Cerebellar: Normal finger to nose and heel to shin bilaterally. Gait: not tested       ASSESSMENT/PLAN Ms. ZELA CANTOR is a 82 y.o. female with history of hypertension, coronary artery disease, previous MI, diabetes mellitus, and hyperlipidemia with statin allergy presenting with right  hemiparesis and dysarthria. She did not receive IV t-PA due to late presentation.  Strokes:  Dominant infarct - embolic from an unknown source.  Resultant    MRI - Multifocal areas of LEFT hemisphere infarction affecting the basal ganglia, deep white matter, and thalamus.  MRA  75% stenosis of the dominant LEFT vertebral,  Carotid Doppler - 1-39% ICA plaquing. Vertebral artery flow antegrade with abnormal waveform on the right.   2D Echo - EF 60-65%. No cardiac source of emboli identified.  LDL - 237  HgbA1c pending  VTE prophylaxis - SCDs Diet heart healthy/carb modified Room service appropriate?: Yes; Fluid consistency:: Thin  aspirin 81 mg daily prior to admission, now on aspirin 325 mg daily  Patient counseled to be compliant with her antithrombotic medications  Ongoing aggressive stroke risk factor management  Therapy recommendations: CIR recommended. Rehabilitation M.D. consult pending.  Disposition: Pending  Hypertension  Stable  Permissive hypertension (OK if < 220/120) but gradually normalize in 5-7 days  Hyperlipidemia  Home meds:  No lipid lowering medications prior to admission. Statin allergy.  LDL 237, goal < 70  Statin allergy  Try Zetia   Diabetes  HgbA1c pending, goal < 7.0  Uncontrolled   Other Stroke Risk Factors  Advanced age  Family hx stroke (Mother)  Coronary artery disease   Other Active Problems  Renal insufficiency - improving  Anemia   PLAN  Will schedule TEE and possible loop Monday - NPO order written  CBC with differential in a.m. Temp 99. 3  Hospital day # 2  Lowry Ram [facility] Pager [phone] 12/11/2015, [id number] AM   Cardioembolic work up TEE and Holter Leotis Pain    To contact Stroke Continuity provider, please refer to [email or web address] After hours, contact General Neurology

## 2015-12-12 ENCOUNTER — Encounter (HOSPITAL_COMMUNITY)
Admission: EM | Disposition: A | Discharge status: Skilled Nursing Facility | Payer: Self-pay | Source: Home / Self Care | Attending: Internal Medicine

## 2015-12-12 DIAGNOSIS — I639 Cerebral infarction, unspecified: Secondary | ICD-10-CM | POA: Diagnosis not present

## 2015-12-12 DIAGNOSIS — E119 Type 2 diabetes mellitus without complications: Secondary | ICD-10-CM

## 2015-12-12 DIAGNOSIS — I25810 Atherosclerosis of coronary artery bypass graft(s) without angina pectoris: Secondary | ICD-10-CM | POA: Diagnosis not present

## 2015-12-12 DIAGNOSIS — D62 Acute posthemorrhagic anemia: Secondary | ICD-10-CM

## 2015-12-12 DIAGNOSIS — E1140 Type 2 diabetes mellitus with diabetic neuropathy, unspecified: Secondary | ICD-10-CM | POA: Diagnosis not present

## 2015-12-12 DIAGNOSIS — I69322 Dysarthria following cerebral infarction: Secondary | ICD-10-CM | POA: Diagnosis not present

## 2015-12-12 DIAGNOSIS — I10 Essential (primary) hypertension: Secondary | ICD-10-CM

## 2015-12-12 DIAGNOSIS — E785 Hyperlipidemia, unspecified: Secondary | ICD-10-CM

## 2015-12-12 DIAGNOSIS — D72829 Elevated white blood cell count, unspecified: Secondary | ICD-10-CM

## 2015-12-12 DIAGNOSIS — M4802 Spinal stenosis, cervical region: Secondary | ICD-10-CM

## 2015-12-12 DIAGNOSIS — R001 Bradycardia, unspecified: Secondary | ICD-10-CM

## 2015-12-12 DIAGNOSIS — I119 Hypertensive heart disease without heart failure: Secondary | ICD-10-CM | POA: Diagnosis not present

## 2015-12-12 DIAGNOSIS — I69351 Hemiplegia and hemiparesis following cerebral infarction affecting right dominant side: Secondary | ICD-10-CM | POA: Diagnosis not present

## 2015-12-12 DIAGNOSIS — I2510 Atherosclerotic heart disease of native coronary artery without angina pectoris: Secondary | ICD-10-CM | POA: Diagnosis not present

## 2015-12-12 DIAGNOSIS — R262 Difficulty in walking, not elsewhere classified: Secondary | ICD-10-CM | POA: Diagnosis not present

## 2015-12-12 DIAGNOSIS — I6349 Cerebral infarction due to embolism of other cerebral artery: Secondary | ICD-10-CM | POA: Diagnosis not present

## 2015-12-12 LAB — BASIC METABOLIC PANEL
Anion gap: 12 (ref 5–15)
BUN: 25 mg/dL — ABNORMAL HIGH (ref 6–20)
CO2: 24 mmol/L (ref 22–32)
Calcium: 9.1 mg/dL (ref 8.9–10.3)
Chloride: 100 mmol/L — ABNORMAL LOW (ref 101–111)
Creatinine, Ser: 1 mg/dL (ref 0.44–1.00)
GFR calc Af Amer: 57 mL/min — ABNORMAL LOW (ref >60)
GFR calc non Af Amer: 50 mL/min — ABNORMAL LOW (ref >60)
Glucose, Bld: 87 mg/dL (ref 65–99)
Potassium: 4.1 mmol/L (ref 3.5–5.1)
Sodium: 136 mmol/L (ref 135–145)

## 2015-12-12 LAB — CBC WITH DIFFERENTIAL/PLATELET
Basophils Absolute: 0 10*3/uL (ref 0.0–0.1)
Basophils Relative: 0 %
Eosinophils Absolute: 0.2 10*3/uL (ref 0.0–0.7)
Eosinophils Relative: 2 %
HCT: 36.3 % (ref 36.0–46.0)
Hemoglobin: 11.3 g/dL — ABNORMAL LOW (ref 12.0–15.0)
Lymphocytes Relative: 33 %
Lymphs Abs: 4.4 10*3/uL — ABNORMAL HIGH (ref 0.7–4.0)
MCH: 22.8 pg — ABNORMAL LOW (ref 26.0–34.0)
MCHC: 31.1 g/dL (ref 30.0–36.0)
MCV: 73.3 fL — ABNORMAL LOW (ref 78.0–100.0)
Monocytes Absolute: 1.1 10*3/uL — ABNORMAL HIGH (ref 0.1–1.0)
Monocytes Relative: 8 %
Neutro Abs: 7.5 10*3/uL (ref 1.7–7.7)
Neutrophils Relative %: 57 %
Platelets: 329 10*3/uL (ref 150–400)
RBC: 4.95 MIL/uL (ref 3.87–5.11)
RDW: 16.7 % — ABNORMAL HIGH (ref 11.5–15.5)
WBC: 13.1 10*3/uL — ABNORMAL HIGH (ref 4.0–10.5)

## 2015-12-12 LAB — HEMOGLOBIN A1C
Hgb A1c MFr Bld: 6.2 % — ABNORMAL HIGH (ref 4.8–5.6)
Mean Plasma Glucose: 131 mg/dL

## 2015-12-12 LAB — GLUCOSE, CAPILLARY: Glucose-Capillary: 83 mg/dL (ref 65–99)

## 2015-12-12 SURGERY — LOOP RECORDER INSERTION

## 2015-12-12 MED ORDER — ASPIRIN 325 MG PO TBEC: 325.0000 mg | DELAYED_RELEASE_TABLET | Freq: Every day | ORAL | Status: DC

## 2015-12-12 MED ORDER — EZETIMIBE 10 MG PO TABS: 10.0000 mg | ORAL_TABLET | Freq: Every day | ORAL | Status: DC

## 2015-12-12 NOTE — Clinical Social Work Placement (Signed)
   CLINICAL SOCIAL WORK PLACEMENT  NOTE  Date:  12/12/2015  Patient Details  Name: [name] MRN: [id number] Date of Birth: 10-Dec-1926  Clinical Social Work is seeking post-discharge placement for this patient at the [place] level of care (*CSW will initial, date and re-position this form in  chart as items are completed):  Yes   Patient/family provided with [place] Work Department's list of facilities offering this level of care within the geographic area requested by the patient (or if unable, by the patient's family).  Yes   Patient/family informed of their freedom to choose among providers that offer the needed level of care, that participate in Medicare, Medicaid or managed care program needed by the patient, have an available bed and are willing to accept the patient.      Patient/family informed of [facility]'s ownership interest in [facility] and [facility], as well as of the fact that they are under no obligation to receive care at these facilities.  PASRR submitted to EDS on       PASRR number received on 12/10/15     Existing PASRR number confirmed on 12/10/15     FL2 transmitted to all facilities in geographic area requested by pt/family on 12/10/15     FL2 transmitted to all facilities within larger geographic area on 12/10/15     Patient informed that his/her managed care company has contracts with or will negotiate with certain facilities, including the following:        Yes   Patient/family informed of bed offers received.  Patient chooses bed at  [facility])     Physician recommends and patient chooses bed at      Patient to be [facility] to  [facility] on 12/12/15.  Patient to be transferred to facility by  [facility])     Patient family notified on 12/12/15 of transfer.  Name of family member notified:  Patient and daughter made aware     PHYSICIAN       Additional Comment:     _______________________________________________ Raymondo Band, LCSW 12/12/2015, 3:12 PM

## 2015-12-12 NOTE — Progress Notes (Signed)
Report given to Tiffany from [facility] at this time. Family at bedside requesting to transport patient to facility. All belongings send with family at this time. No other distress or concerns voice.  Anaia Frith, RN.

## 2015-12-12 NOTE — Discharge Instructions (Signed)
Stroke Prevention Some medical conditions and behaviors are associated with an increased chance of having a stroke. You may prevent a stroke by making healthy choices and managing medical conditions. HOW CAN I REDUCE MY RISK OF HAVING A STROKE?   Stay physically active. Get at least 30 minutes of activity on most or all days.  Do not smoke. It may also be helpful to avoid exposure to secondhand smoke.  Limit alcohol use. Moderate alcohol use is considered to be:  No more than 2 drinks per day for men.  No more than 1 drink per day for nonpregnant women.  Eat healthy foods. This involves:  Eating 5 or more servings of fruits and vegetables a day.  Making dietary changes that address high blood pressure (hypertension), high cholesterol, diabetes, or obesity.  Manage your cholesterol levels.  Making food choices that are high in fiber and low in saturated fat, trans fat, and cholesterol may control cholesterol levels.  Take any prescribed medicines to control cholesterol as directed by your health care provider.  Manage your diabetes.  Controlling your carbohydrate and sugar intake is recommended to manage diabetes.  Take any prescribed medicines to control diabetes as directed by your health care provider.  Control your hypertension.  Making food choices that are low in salt (sodium), saturated fat, trans fat, and cholesterol is recommended to manage hypertension.  Ask your health care provider if you need treatment to lower your blood pressure. Take any prescribed medicines to control hypertension as directed by your health care provider.  If you are 18-39 years of age, have your blood pressure checked every 3-5 years. If you are 40 years of age or older, have your blood pressure checked every year.  Maintain a healthy weight.  Reducing calorie intake and making food choices that are low in sodium, saturated fat, trans fat, and cholesterol are recommended to manage  weight.  Stop drug abuse.  Avoid taking birth control pills.  Talk to your health care provider about the risks of taking birth control pills if you are over 35 years old, smoke, get migraines, or have ever had a blood clot.  Get evaluated for sleep disorders (sleep apnea).  Talk to your health care provider about getting a sleep evaluation if you snore a lot or have excessive sleepiness.  Take medicines only as directed by your health care provider.  For some people, aspirin or blood thinners (anticoagulants) are helpful in reducing the risk of forming abnormal blood clots that can lead to stroke. If you have the irregular heart rhythm of atrial fibrillation, you should be on a blood thinner unless there is a good reason you cannot take them.  Understand all your medicine instructions.  Make sure that other conditions (such as anemia or atherosclerosis) are addressed. SEEK IMMEDIATE MEDICAL CARE IF:   You have sudden weakness or numbness of the face, arm, or leg, especially on one side of the body.  Your face or eyelid droops to one side.  You have sudden confusion.  You have trouble speaking (aphasia) or understanding.  You have sudden trouble seeing in one or both eyes.  You have sudden trouble walking.  You have dizziness.  You have a loss of balance or coordination.  You have a sudden, severe headache with no known cause.  You have new chest pain or an irregular heartbeat. Any of these symptoms may represent a serious problem that is an emergency. Do not wait to see if the symptoms will   go away. Get medical help at once. Call your local emergency services (911 in [place] Do not drive yourself to the hospital.   This information is not intended to replace advice given to you by your health care provider. Make sure you discuss any questions you have with your health care provider.   Document Released: 09/20/2004 Document Revised: 09/03/2014 Document Reviewed:  02/13/2013 Elsevier Interactive Patient Education 2016 Elsevier Inc.  

## 2015-12-12 NOTE — Clinical Social Work Placement (Signed)
   CLINICAL SOCIAL WORK PLACEMENT  NOTE  Date:  12/12/2015  Patient Details  Name: [name] MRN: [id number] Date of Birth: 17-Jul-1927  Clinical Social Work is seeking post-discharge placement for this patient at the [place] level of care (*CSW will initial, date and re-position this form in  chart as items are completed):  Yes   Patient/family provided with [place] Work Department's list of facilities offering this level of care within the geographic area requested by the patient (or if unable, by the patient's family).  Yes   Patient/family informed of their freedom to choose among providers that offer the needed level of care, that participate in Medicare, Medicaid or managed care program needed by the patient, have an available bed and are willing to accept the patient.      Patient/family informed of [facility]'s ownership interest in [facility] and [facility], as well as of the fact that they are under no obligation to receive care at these facilities.  PASRR submitted to EDS on       PASRR number received on 12/10/15     Existing PASRR number confirmed on 12/10/15     FL2 transmitted to all facilities in geographic area requested by pt/family on 12/10/15     FL2 transmitted to all facilities within larger geographic area on 12/10/15     Patient informed that his/her managed care company has contracts with or will negotiate with certain facilities, including the following:            Patient/family informed of bed offers received.  Patient chooses bed at       Physician recommends and patient chooses bed at      Patient to be transferred to   on  .  Patient to be transferred to facility by       Patient family notified on   of transfer.  Name of family member notified:        PHYSICIAN       Additional Comment:    _______________________________________________ Raymondo Band, LCSW 12/12/2015, 2:13 PM

## 2015-12-12 NOTE — Care Management Important Message (Signed)
Important Message  Patient Details  Name: [name] MRN: [id number] Date of Birth: 1927/08/09   Medicare Important Message Given:  Yes    Vivi Piccirilli P Kahari Critzer 12/12/2015, 1:57 PM

## 2015-12-12 NOTE — Consult Note (Deleted)
entered in error   

## 2015-12-12 NOTE — Progress Notes (Addendum)
Patient has accepted bed offer at [facility]. Facility has been informed of patient and family's selection. Per facility representative Hassan Rowan, patient can arrive to facility at anytime. Patient and family appreciative of CSW services.  Patient to be transported to [facility] on today by family. D/C Summary sent via Hub system. No further needs were requested at this time. CSW to sign off.   Please re-consult if further CSW needs arise.   Lucius Conn, [place] Worker [facility] Ph: [phone]

## 2015-12-12 NOTE — Care Management Note (Signed)
Case Management Note  Patient Details  Name: [name] MRN: [id number] Date of Birth: 1926/09/09  Subjective/Objective:                    Action/Plan: Patient was admitted with CVA. Admitted from home. Will follow for discharge needs pending PT/OT evals and physician orders.  Expected Discharge Date:                  Expected Discharge Plan:     In-House Referral:     Discharge planning Services     Post Acute Care Choice:    Choice offered to:     DME Arranged:    DME Agency:     HH Arranged:    [place] Agency:     Status of Service:     Medicare Important Message Given:    Date Medicare IM Given:    Medicare IM give by:    Date Additional Medicare IM Given:    Additional Medicare Important Message give by:     If discussed at [place] of Stay Meetings, dates discussed:    Additional Comments:  Rolm Baptise, RN 12/12/2015, 11:06 AM [phone]

## 2015-12-12 NOTE — Discharge Summary (Signed)
Physician Discharge Summary  [name] [id number] DOB: [birth year] DOA: 12/09/2015  PCP: Rochel Brome, MD  Admit date: 12/09/2015 Discharge date: 12/12/2015  Recommendations for Outpatient Follow-up:  1. Pt will need to follow up with PCP in 2-3 weeks post discharge 2. Please obtain BMP to evaluate electrolytes and kidney function 3. Please also check WBC, for unclear reason slightly elevated at 13 K but no fevers or tachycardia noted  4. Please note that Lorazepam and percocet were temporarily held due to acute stroke, this can be resumed as soon possible if mental status remains clear as needed, will defer to doctor at the facility to determine if appropriate upon initial evaluation   Discharge Diagnoses:  Principal Problem:   CVA (cerebral infarction) Active Problems:   Essential hypertension   Coronary atherosclerosis   HLD (hyperlipidemia)   DM (diabetes mellitus) ([place])   Cervical stenosis of spinal canal   Diabetes mellitus type 2 in nonobese (HCC)   Bradycardia   Leukocytosis   Acute blood loss anemia  Discharge Condition: Stable  Diet recommendation: Heart healthy diet discussed in details   History of present illness:   PCP: Rochel Brome, MD  Outpatient Specialists:   Brief Narrative:  82 y.o. female with hypertension, diabetes mellitus and hyperlipidemia presenting with new onset right-sided weakness and slurred speech. Patient's initial symptoms began with right lower extremity weakness on 12/05/2015.  Assessment & Plan:  Principal Problem:  CVA (cerebral infarction) - Multifocal areas of LEFT hemisphere infarction affecting the basal ganglia, deep white matter, and thalamus.  - ? ischemia within the LEFT MCA lenticulostriate territory, LEFT PCA thalamoperforate territory, or both - ECHO with EF 123456, grade I diastolic CHF - LDL 123XX123, cholesterol 327  - PT eval done, pt will be d/c to SNF - statin and aspirin to be continued upon discharge  -  please note that ativan and percocet held temporarily, can resume at the facility if needed and mental status clear  Active Problems:  Essential hypertension - continue with Lisinopril    Coronary atherosclerosis - continue with aspirin and statin    DM (diabetes mellitus) ([place]) with complications of neuropathy, CKD stage II - diet controlled, A1C 6.2 - no need for antihyperglycemic regimen at this time - needs repeat A1C in 3 months    Leukocytosis - unclear etiology - ? UTI but pt with no urinary concerns - UA unremarkable, no specific cardiopulmonary concerns suggestive of PNA    CKD stage II with baseline GFR in 70's in the past year - Cr slightly up on admission - encouraged PO intake - Cr is now WNL    Hypokalemia - WNL this AM   Hyponatremia - resolved   DVT prophylaxis: SCD's while inpatient  Code Status: Full  Family Communication: Patient at bedside  Disposition Plan: SNF  Consultants:   Neurology  PT  Procedures:   None  Discharge Exam: Filed Vitals:   12/12/15 0454 12/12/15 1000  BP: 175/75 124/90  Pulse: 55 54  Temp: 98.1 F (36.7 C) 98.1 F (36.7 C)  Resp: 16 20   Filed Vitals:   12/11/15 2158 12/12/15 0237 12/12/15 0454 12/12/15 1000  BP: 144/51 150/61 175/75 124/90  Pulse: 60 58 55 54  Temp: 98.8 F (37.1 C) 97.7 F (36.5 C) 98.1 F (36.7 C) 98.1 F (36.7 C)  TempSrc: Oral Oral  Oral  Resp: 16 16 16 20   Height:      Weight:      SpO2: 97% 96% 98% 98%  General: Pt is alert, follows commands appropriately, not in acute distress Cardiovascular: Regular rhythm, bradycardic, S1/S2 +, no rubs, no gallops Respiratory: Clear to auscultation bilaterally, no wheezing, no crackles, no rhonchi Abdominal: Soft, non tender, non distended, bowel sounds +, no guarding  Discharge Instructions  Discharge Instructions    Diet - low sodium heart healthy    Complete by:  As directed      Increase activity slowly    Complete by:   As directed             Medication List    STOP taking these medications        aspirin 81 MG tablet  Replaced by:  aspirin 325 MG EC tablet     LORazepam 1 MG tablet  Commonly known as:  ATIVAN     oxyCODONE-acetaminophen 5-325 MG tablet  Commonly known as:  PERCOCET/ROXICET      TAKE these medications        aspirin 325 MG EC tablet  Take 1 tablet (325 mg total) by mouth daily.     cyclobenzaprine 10 MG tablet  Commonly known as:  FLEXERIL  Take 10 mg by mouth 3 (three) times daily as needed for muscle spasms.     ezetimibe 10 MG tablet  Commonly known as:  ZETIA  Take 1 tablet (10 mg total) by mouth daily.     fish oil-omega-3 fatty acids 1000 MG capsule  Take 1 g by mouth daily.     furosemide 20 MG tablet  Commonly known as:  LASIX  Take 20 mg by mouth 2 (two) times daily as needed for fluid.     ibuprofen 200 MG tablet  Commonly known as:  ADVIL,MOTRIN  Take 400 mg by mouth every 6 (six) hours as needed for moderate pain.     lisinopril 20 MG tablet  Commonly known as:  PRINIVIL,ZESTRIL  Take 20 mg by mouth daily.     omeprazole 20 MG capsule  Commonly known as:  PRILOSEC  Take 20 mg by mouth daily.     sertraline 100 MG tablet  Commonly known as:  ZOLOFT  Take 100 mg by mouth at bedtime.     timolol 0.5 % ophthalmic solution  Commonly known as:  TIMOPTIC  Place 1 drop into both eyes every morning.           Follow-up Information    Follow up with [facility] On 12/22/2015.   Why:  For wound re-check @ 12:00pm in [address]   Contact information:   [place] [phone]      Follow up with Rochel Brome, MD.   Specialties:  Family Medicine, Interventional Cardiology, Radiology, Anesthesiology   Contact information:   [place] [address] [phone]        The results of significant diagnostics from this  hospitalization (including imaging, microbiology, ancillary and laboratory) are listed below for reference.     Microbiology: Recent Results (from the past 240 hour(s))  Urine culture     Status: None (Preliminary result)   Collection Time: 12/11/15  6:00 PM  Result Value Ref Range Status   Specimen Description URINE, CLEAN CATCH  Final   Special Requests NONE  Final   Culture TOO YOUNG TO READ  Final   Report Status PENDING  Incomplete     Labs: Basic Metabolic Panel:  Recent Labs Lab 12/09/15 1312 12/09/15 1330 12/11/15 0210  12/12/15 0641  NA 135 136 134* 136  K 3.6 3.3* 3.9 4.1  CL 99* 98* 99* 100*  CO2 22  --  24 24  GLUCOSE 158* 144* 140* 87  BUN 33* 33* 26* 25*  CREATININE 1.29* 1.30* 0.99 1.00  CALCIUM 8.8*  --  9.0 9.1   Liver Function Tests:  Recent Labs Lab 12/09/15 1312  AST 28  ALT 25  ALKPHOS 58  BILITOT 0.4  PROT 6.5  ALBUMIN 3.6   CBC:  Recent Labs Lab 12/09/15 1312 12/09/15 1330 12/11/15 0210 12/12/15 0641  WBC 11.5*  --  13.7* 13.1*  NEUTROABS 6.4  --   --  7.5  HGB 11.2* 12.9 10.7* 11.3*  HCT 35.0* 38.0 34.8* 36.3  MCV 73.7*  --  72.5* 73.3*  PLT 323  --  342 329   CBG:  Recent Labs Lab 12/11/15 0802 12/11/15 1107 12/11/15 1604 12/11/15 2145 12/12/15 0652  GLUCAP 169* 94 116* 138* 83     SIGNED: Time coordinating discharge: 30 minutes  MAGICK-Brittaney Beaulieu, MD  [facility] 12/12/2015, 2:42 PM Pager [phone]  If 7PM-7AM, please contact night-coverage [email or web address] Password [email or web address]

## 2015-12-12 NOTE — Clinical Social Work Note (Signed)
Clinical Social Work Assessment  Patient Details  Name: NYLE BLAKENSHIP MRN: [id number] Date of Birth: 11-23-1926  Date of referral:  12/12/15               Reason for consult:  Facility Placement                Permission sought to share information with:  Case Manager, Family Supports Permission granted to share information::  Yes, Verbal Permission Granted  Name::     Manuela Schwartz and [place]::     Relationship::  Daughters  Contact Information:  [phone]  Housing/Transportation Living arrangements for the past 2 months:  [place] of Information:  Patient, Adult Children Patient Interpreter Needed:  None Criminal Activity/Legal Involvement Pertinent to Current Situation/Hospitalization:  No - Comment as needed Significant Relationships:  Adult Children Lives with:  Self Do you feel safe going back to the place where you live?  Yes Need for family participation in patient care:  Yes (Comment)  Care giving concerns:  None reported at this time.    Social Worker assessment / plan: CSW received consult by MD. CSW went to speak with patient regarding the possible need for short term rehab. CSW introduced self and acknowledged the patient. Patient is alert and orientedx4. Patient was calm and cooperative with CSW assessment. Patient;s daughter Manuela Schwartz was at bedside. Patient provided CSW with permission to speak while family is in room. CSW informed patient of PT recommendation for short term rehab. Patient and family is agreeable to SNF placement. CSW was provided permission to fax clinical information out to the facilities. Patient and family's preferences is [facility] and [facility]. CSW to complete FL2 for MD signature. CSW to initiate SNF placement process.    Employment status:  Disabled (Comment on whether or not currently receiving Disability) Insurance information:  Managed Medicare PT Recommendations:  Inpatient Rehab Consult, [place] / Referral to community resources:     Patient/Family's Response to care:  Both patient and daughter is agreeable to SNF placement for the patient.   Patient/Family's Understanding of and Emotional Response to Diagnosis, Current Treatment, and Prognosis:  Patient and family were very cooperative with CSW assessment. Family is very involved in patient's care and is aware of current treatment and prognosis. Patient and family is agreeable to disposition plan. Patient voiced she does not want the CIR at this time. CSW to inform RNCM and MD. Patient and family are appreciative of CSW intervention.    Emotional Assessment Appearance:  Appears stated age Attitude/Demeanor/Rapport:   (Calm and Cooperative) Affect (typically observed):  Accepting, Appropriate, Calm Orientation:  Oriented to Self, Oriented to Place, Oriented to Situation, Oriented to  Time Alcohol / Substance use:  Not Applicable Psych involvement (Current and /or in the community):  No (Comment)  Discharge Needs  Concerns to be addressed:  Discharge Planning Concerns Readmission within the last 30 days:  No Current discharge risk:  Other (Requires further medical workup) Barriers to Discharge:  Continued Medical Work up   [employer], LCSW 12/12/2015, 2:03 PM

## 2015-12-12 NOTE — Progress Notes (Signed)
Physical Therapy Treatment Patient Details Name: [name] MRN: [id number] DOB: [birth year] Today's Date: 12/12/2015    History of Present Illness Pt adm with rt side weakness and speech difficulties. MRI showed multifocal areas of LEFT hemisphere infarction affecting the basal ganglia, deep white matter, and thalamus. PMH - CABG, MI, HTN, DM, back surgery x 2, rt shoulder surgery    PT Comments    Pt progressing well. Able to tolerate ambulation this date however con't to demo R sided weakness, impaired balance, impaired motor planning and sequencing. con't to recommend CIR upon d/c however pt prefers [place] due to that's where she lives.  Follow Up Recommendations  CIR     Equipment Recommendations       Recommendations for Other Services Rehab consult     Precautions / Restrictions Precautions Precautions: Fall Restrictions Weight Bearing Restrictions: No    Mobility  Bed Mobility               General bed mobility comments: pt up in chair upon PT arrival  Transfers Overall transfer level: Needs assistance Equipment used: 1 person hand held assist Transfers: Sit to/from Stand Sit to Stand: Min assist;Mod assist         General transfer comment: max directional v/c's to push up from chair, assist at R knee to power up  Ambulation/Gait Ambulation/Gait assistance: Mod assist;+2 safety/equipment Ambulation Distance (Feet): 40 Feet Assistive device: Rolling walker (2 wheeled) Gait Pattern/deviations: Step-to pattern;Decreased stride length;Decreased step length - left;Decreased stance time - right;Decreased step length - right;Narrow base of support Gait velocity: dec Gait velocity interpretation: Below normal speed for age/gender General Gait Details: pt with no knee buckling however was locked in extension, v/c's to bring R knee up high to get sequencing during swing phase. Pt with impaired motor planning and processing of stepping pattern   Stairs            Wheelchair Mobility    Modified Rankin (Stroke Patients Only) Modified Rankin (Stroke Patients Only) Pre-Morbid Rankin Score: No symptoms Modified Rankin: Severe disability     Balance Overall balance assessment: Needs assistance         Standing balance support: Bilateral upper extremity supported Standing balance-Leahy Scale: Poor Standing balance comment: requires RW                    Cognition Arousal/Alertness: Awake/alert Behavior During Therapy: WFL for tasks assessed/performed Overall Cognitive Status: Within Functional Limits for tasks assessed                      Exercises General Exercises - Lower Extremity Hip Flexion/Marching: AROM;10 reps;Standing;Right    General Comments        Pertinent Vitals/Pain Pain Assessment: No/denies pain    Home Living                      Prior Function            PT Goals (current goals can now be found in the care plan section) Acute Rehab PT Goals Patient Stated Goal: go to [place] for rehab Progress towards PT goals: Progressing toward goals    Frequency  Min 4X/week    PT Plan Current plan remains appropriate    Co-evaluation             End of Session Equipment Utilized During Treatment: Gait belt Activity Tolerance: Patient tolerated treatment well Patient left: in chair;with call bell/phone within  reach;with chair alarm set;with family/visitor present     Time: [id number] PT Time Calculation (min) (ACUTE ONLY): 19 min  Charges:  $Gait Training: 8-22 mins                    G Codes:      Kingsley Callander 12/12/2015, 2:15 PM  Kittie Plater, PT, DPT Pager #: [phone] Office #: [phone]

## 2015-12-12 NOTE — Consult Note (Signed)
Physical Medicine and Rehabilitation Consult Reason for Consult: Multifocal areas of left hemisphere infarction Referring Physician: [facility]   HPI: [name] is a 82 y.o. right handed female with history of hypertension, hyperlipidemia, diabetes mellitus, CAD status post CABG with cardiomyopathy maintained on aspirin 81 mg daily. Per chart review patient lives alone independent  prior to admission and driving. One level home 3 steps to entry. Daughter lives next door and works. Presented 12/09/2015 with right-sided weakness, slurred speech 1 week as well as neck pain. Initially presented to the ED was discharged home and later presented with persistent symptoms as well as developed a headache and accelerated hypertension. CT of the head negative. MRI of the brain showed multifocal areas of left hemisphere infarction affecting the basal ganglia, deep white matter and thalamus. Atrophy and small vessel disease. MRI cervical spine moderate to severe stenosis at C5-6. Patient did not receive TPA. MRI of the head 75% stenosis of the dominant left vertebral artery. No anterior circulation disease affecting the left ICA or left MCA. Echocardiogram with ejection fraction of [id number] grade 1 diastolic dysfunction. Carotid Doppler no ICA stenosis. Neurology consulted placed on aspirin 325 mg daily. Currently NPO awaiting TEE study. Physical therapy evaluation completed 12/10/2015 with recommendations of physical medicine rehabilitation consult.   Review of Systems  Constitutional: Negative for fever and chills.  HENT: Negative for hearing loss.   Eyes: Negative for blurred vision and double vision.  Respiratory: Negative for cough and shortness of breath.   Cardiovascular: Negative for chest pain, palpitations and leg swelling.  Gastrointestinal: Negative for nausea and vomiting.       GERD  Genitourinary: Positive for frequency.  Musculoskeletal: Positive for back pain.  Skin: Negative for rash.    Neurological: Positive for speech change, weakness and headaches. Negative for seizures.  Psychiatric/Behavioral: Positive for depression.  All other systems reviewed and are negative.  Past Medical History  Diagnosis Date  . CAD (coronary artery disease)   . HTN (hypertension)   . Dyslipidemia   . Cardiomyopathy, ischemic     EF 40% at the time cath  . Arthritis   . Anemia     hx  . Myocardial infarction (HCC)     mild  . Diabetes mellitus without complication (HCC)     borderline no meds  . Depression   . Urinary frequency   . GERD (gastroesophageal reflux disease)    Past Surgical History  Procedure Laterality Date  . Coronary artery bypass graft      LIMA to the LAD, SVG to diagonal, SVG to obtuse marginal, SVG to PDA..  2008  . Shoulder arthroscopy w/ rotator cuff repair Right     04  . Appendectomy    . Tonsillectomy    . Cesarean section      x2  . Lumbar laminectomy/decompression microdiscectomy N/A 05/01/2013    Procedure: LUMBAR LAMINECTOMY/DECOMPRESSION MICRODISCECTOMY 2 LEVELS;  Surgeon: Winfield Cunas, MD;  Location: [place] [facility];  Service: Neurosurgery;  Laterality: N/A;  Lumbar Three-Four, Four-Five Laminectomies   . Lumbar laminectomy/decompression microdiscectomy N/A 07/14/2014    Procedure: LUMBAR LAMINECTOMY/DECOMPRESSION MICRODISCECTOMY 1 LEVEL LUMBAR TWO-THREE;  Surgeon: Ashok Pall, MD;  Location: [place] [facility];  Service: Neurosurgery;  Laterality: N/A;   Family History  Problem Relation Age of Onset  . Stroke Mother   . Heart attack Father    Social History:  reports that she has never smoked. She does not have any smokeless tobacco history  on file. She reports that she does not drink alcohol or use illicit drugs. Allergies:  Allergies  Allergen Reactions  . Atorvastatin Other (See Comments)    Muscle pain  . Statins Other (See Comments)    MUSCLE PAIN  . Penicillins Other (See Comments)   Medications Prior to Admission  Medication Sig  Dispense Refill  . aspirin 81 MG tablet Take 81 mg by mouth daily.      . cyclobenzaprine (FLEXERIL) 10 MG tablet Take 10 mg by mouth 3 (three) times daily as needed for muscle spasms.    . fish oil-omega-3 fatty acids 1000 MG capsule Take 1 g by mouth daily.     . furosemide (LASIX) 20 MG tablet Take 20 mg by mouth 2 (two) times daily as needed for fluid.     Marland Kitchen ibuprofen (ADVIL,MOTRIN) 200 MG tablet Take 400 mg by mouth every 6 (six) hours as needed for moderate pain.    Marland Kitchen lisinopril (PRINIVIL,ZESTRIL) 20 MG tablet Take 20 mg by mouth daily.    Marland Kitchen LORazepam (ATIVAN) 1 MG tablet Take 1 mg by mouth at bedtime.    Marland Kitchen omeprazole (PRILOSEC) 20 MG capsule Take 20 mg by mouth daily.      Marland Kitchen oxyCODONE-acetaminophen (PERCOCET/ROXICET) 5-325 MG tablet Take 1 tablet by mouth every 4 (four) hours as needed for severe pain.    Marland Kitchen sertraline (ZOLOFT) 100 MG tablet Take 100 mg by mouth at bedtime.    . timolol (TIMOPTIC) 0.5 % ophthalmic solution Place 1 drop into both eyes every morning.  1    Home: Home Living Family/patient expects to be discharged to:: Private residence Living Arrangements: Alone Available Help at Discharge: Family, Available PRN/intermittently Type of Home: House Home Access: Stairs to enter Technical brewer of Steps: 3 Entrance Stairs-Rails: Right Home Layout: One level Bathroom Shower/Tub: Multimedia programmer: Handicapped height [place]: Radio producer - single point, Wheelchair - manual, Civil engineer, contracting  Lives With: Alone (daughter lives next door)  Functional History: Prior Function Level of Independence: Independent Functional Status:  Mobility: Bed Mobility Overal bed mobility: Needs Assistance Bed Mobility: Supine to Sit Supine to sit: Mod assist, HOB elevated General bed mobility comments: Pt on BSC upon arrival, returned to chair at end of session. Transfers Overall transfer level: Needs assistance Equipment used: 1 person hand held assist Transfers:  Sit to/from Stand, Stand Pivot Transfers Sit to Stand: Min assist Stand pivot transfers: Mod assist General transfer comment: Pt able to push up from [facility] with bil UEs then reach for chair arm with one hand; hand held assist provided to other UE.      ADL: ADL Overall ADL's : Needs assistance/impaired Eating/Feeding: Set up, Sitting Grooming: Set up, Sitting, Supervision/safety Upper Body Bathing: Set up, Supervision/ safety, Sitting Lower Body Bathing: Moderate assistance, Sit to/from stand Upper Body Dressing : Set up, Supervision/safety, Sitting Lower Body Dressing: Moderate assistance, Sit to/from stand Toilet Transfer: Moderate assistance, Stand-pivot, BSC Toileting- Clothing Manipulation and Hygiene: Minimal assistance, Sit to/from stand Toileting - Clothing Manipulation Details (indicate cue type and reason): Pt able to perform peri care but therapist assisted to make sure pt was clean after BM Functional mobility during ADLs: Moderate assistance (for stand pivot transfer) General ADL Comments: Discussed post acute rehab options; pt agreeable. Considering [facility] becuase it is close to home.  Cognition: Cognition Overall Cognitive Status: Within Functional Limits for tasks assessed Arousal/Alertness: Awake/alert Orientation Level: Oriented X4 Attention: Sustained Sustained Attention: Appears intact Memory:  (scored WNL  on Cognistat subtest) Awareness: Appears intact Problem Solving: Appears intact Safety/Judgment: Appears intact Cognition Arousal/Alertness: Awake/alert Behavior During Therapy: WFL for tasks assessed/performed Overall Cognitive Status: Within Functional Limits for tasks assessed  Blood pressure 175/75, pulse 55, temperature 98.1 F (36.7 C), temperature source Oral, resp. rate 16, height 5' 4.5" (1.638 m), weight 73.347 kg (161 lb 11.2 oz), SpO2 98 %. Physical Exam  Vitals reviewed. Constitutional: She is oriented to person, place, and time. She  appears well-developed and well-nourished.  HENT:  Head: Normocephalic and atraumatic.  Eyes: Conjunctivae and EOM are normal.  Neck: Normal range of motion. Neck supple. No thyromegaly present.  Cardiovascular: Normal rate and regular rhythm.   Respiratory: Effort normal and breath sounds normal. No respiratory distress.  GI: Soft. Bowel sounds are normal. She exhibits no distension.  Musculoskeletal: She exhibits no edema or tenderness.  Neurological: She is alert and oriented to person, place, and time.  Follow simple commands.  Fair awareness of deficits.\ Sensation intact to light touch DTRs symmetric Motor: 4+/5 grossly throughout  Skin: Skin is warm and dry.  Psychiatric: She has a normal mood and affect. Her behavior is normal. Thought content normal.    Results for orders placed or performed during the hospital encounter of 12/09/15 (from the past 24 hour(s))  Glucose, capillary     Status: Abnormal   Collection Time: 12/11/15  8:02 AM  Result Value Ref Range   Glucose-Capillary 169 (H) 65 - 99 mg/dL  Glucose, capillary     Status: None   Collection Time: 12/11/15 11:07 AM  Result Value Ref Range   Glucose-Capillary 94 65 - 99 mg/dL  Glucose, capillary     Status: Abnormal   Collection Time: 12/11/15  4:04 PM  Result Value Ref Range   Glucose-Capillary 116 (H) 65 - 99 mg/dL  Urinalysis, Routine w reflex microscopic (not at [facility])     Status: None   Collection Time: 12/11/15  6:00 PM  Result Value Ref Range   Color, Urine YELLOW YELLOW   APPearance CLEAR CLEAR   Specific Gravity, Urine 1.014 1.005 - 1.030   pH 6.0 5.0 - 8.0   Glucose, UA NEGATIVE NEGATIVE mg/dL   Hgb urine dipstick NEGATIVE NEGATIVE   Bilirubin Urine NEGATIVE NEGATIVE   Ketones, ur NEGATIVE NEGATIVE mg/dL   Protein, ur NEGATIVE NEGATIVE mg/dL   Nitrite NEGATIVE NEGATIVE   Leukocytes, UA NEGATIVE NEGATIVE  Glucose, capillary     Status: Abnormal   Collection Time: 12/11/15  9:45 PM  Result Value  Ref Range   Glucose-Capillary 138 (H) 65 - 99 mg/dL   Comment 1 Notify RN    Comment 2 Document in Chart    Mr Virgel Paling Wo Contrast  12/10/2015  CLINICAL DATA:  Multifocal areas of LEFT hemisphere infarction of an acute nature. Uncertain vascular territory. EXAM: MRA HEAD WITHOUT CONTRAST TECHNIQUE: Angiographic images of the Circle of Willis were obtained using MRA technique without intravenous contrast. COMPARISON:  MRI brain 12/09/2015. FINDINGS: The internal carotid arteries are widely patent. No anterior circulation disease. Basilar artery widely patent.  No PCA narrowing or occlusion. Moderately narrowed distal RIGHT vertebral, likely reflects primary contribution of this vessel to the RIGHT PICA. On the LEFT, there is a flow-limiting stenosis, 75%, of the distal V4 vertebral segment. No intracranial aneurysm. IMPRESSION: 75% stenosis of the dominant LEFT vertebral, potentially flow reducing and/or symptomatic. No anterior circulation disease affecting the LEFT ICA or LEFT MCA. Electronically Signed   By: Staci Righter  M.D.   On: 12/10/2015 15:35    Assessment/Plan: Diagnosis: Multifocal areas of left hemisphere infarction Labs and images independently reviewed.  Records reviewed and summated above. Stroke: Continue secondary stroke prophylaxis and Risk Factor Modification listed below:   Antiplatelet therapy:   Blood Pressure Management:  Continue current medication with prn's with permisive HTN per primary team Statin Agent:   Diabetes management:    1. Does the need for close, 24 hr/day medical supervision in concert with the patient's rehab needs make it unreasonable for this patient to be served in a less intensive setting? Potentially  2. Co-Morbidities requiring supervision/potential complications: HTN (monitor and provide prns in accordance with increased physical exertion and pain), hyperlipidemia (continue meds), diabetes mellitus (Monitor in accordance with exercise and adjust  meds as necessary), CAD status post CABG with cardiomyopathy (continue meds,Monitor in accordance with increased physical activity and avoid UE resistance excercises), Bradycardia (monitor heart rate with increased physical activity), leukocytosis (cont to monitor for signs and symptoms of infection, further workup if indicated), ABLA (transfuse if necessary to ensure appropriate perfusion for increased activity tolerance) 3. Due to safety, disease management and patient education, does the patient require 24 hr/day rehab nursing? Yes 4. Does the patient require coordinated care of a physician, rehab nurse, PT (1-2 hrs/day, 5 days/week) and OT (1-2 hrs/day, 5 days/week) to address physical and functional deficits in the context of the above medical diagnosis(es)? Potentially Addressing deficits in the following areas: balance, endurance, locomotion, transferring, toileting and psychosocial support 5. Can the patient actively participate in an intensive therapy program of at least 3 hrs of therapy per day at least 5 days per week? Yes 6. The potential for patient to make measurable gains while on inpatient rehab is excellent 7. Anticipated functional outcomes upon discharge from inpatient rehab are supervision  with PT, supervision with OT, n/a with SLP. 8. Estimated rehab length of stay to reach the above functional goals is: 13-16 days. 9. Does the patient have adequate social supports and living environment to accommodate these discharge functional goals? No 10. Anticipated D/C setting: Other 11. Anticipated post D/C treatments: HH therapy and Home excercise program 12. Overall Rehab/Functional Prognosis: good  RECOMMENDATIONS: This patient's condition is appropriate for continued rehabilitative care in the following setting: Patient prefers to go to [facility].  Further, she will unlikely be able to reach an independent level of functioning after a short IRF stay. Would recommend SNF after  completion of medical workup.  Patient has agreed to participate in recommended program. Potentially Note that insurance prior authorization may be required for reimbursement for recommended care.  Comment: Rehab Admissions Coordinator to follow up.  Delice Lesch, MD 12/12/2015

## 2015-12-12 NOTE — Progress Notes (Signed)
STROKE TEAM PROGRESS NOTE   HISTORY OF PRESENT ILLNESS [name] is an 82 y.o. female of a history of hypertension, diabetes mellitus and hyperlipidemia presenting with new onset right-sided weakness and slurred speech. Patient's initial symptoms began with right lower extremity weakness on 12/05/2015. She was seen at [facility]. CT scan of the head was unremarkable. She began to have speech changes on 12/08/2015 and had clear weakness of right upper and lower extremity as well as her speech when she woke up this morning. She's been taking aspirin daily. MRI showed multiple areas of acute infarction involving the left basal ganglia, deep white matter and thalamus.  LSN: 12/05/2015 tPA Given: No: P.m. time window for treatment consideration mRankin:   SUBJECTIVE (INTERVAL HISTORY) Daughter is present. The patient is alert and without complaints. Apparently she was independent to admission and left alone.TEE and possible loop  were discussed  But I feel are not necessary as her infarcts are lacunar from small vessel disease.   OBJECTIVE Temp:  [97.2 F (36.2 C)-98.8 F (37.1 C)] 98.3 F (36.8 C) (04/17 1446) Pulse Rate:  [54-66] 59 (04/17 1446) Cardiac Rhythm:  [-] Sinus bradycardia (04/17 0703) Resp:  [16-20] 20 (04/17 1446) BP: (124-175)/(51-90) 131/55 mmHg (04/17 1446) SpO2:  [96 %-99 %] 99 % (04/17 1446)  CBC:   Recent Labs Lab 12/09/15 1312  12/11/15 0210 12/12/15 0641  WBC 11.5*  --  13.7* 13.1*  NEUTROABS 6.4  --   --  7.5  HGB 11.2*  < > 10.7* 11.3*  HCT 35.0*  < > 34.8* 36.3  MCV 73.7*  --  72.5* 73.3*  PLT 323  --  342 329  < > = values in this interval not displayed.  Basic Metabolic Panel:   Recent Labs Lab 12/11/15 0210 12/12/15 0641  NA 134* 136  K 3.9 4.1  CL 99* 100*  CO2 24 24  GLUCOSE 140* 87  BUN 26* 25*  CREATININE 0.99 1.00  CALCIUM 9.0 9.1    Lipid Panel:     Component Value Date/Time   CHOL 327* 12/10/2015 0648   TRIG 151*  12/10/2015 0648   HDL 60 12/10/2015 0648   CHOLHDL 5.5 12/10/2015 0648   VLDL 30 12/10/2015 0648   LDLCALC 237* 12/10/2015 0648   HgbA1c:  Lab Results  Component Value Date   HGBA1C 6.2* 12/10/2015   Urine Drug Screen: No results found for: LABOPIA, COCAINSCRNUR, LABBENZ, AMPHETMU, THCU, LABBARB    IMAGING  Mr Brain Wo Contrast 12/09/2015   Multifocal areas of LEFT hemisphere infarction affecting the basal ganglia, deep white matter, and thalamus. This could reflect ischemia within the LEFT MCA lenticulostriate territory, LEFT PCA thalamoperforate territory, or both.   Mr Cervical Spine Wo Contrast 12/09/2015   No posttraumatic sequelae in the cervical spine. Moderate to severe stenosis at C5-6. Multilevel foraminal narrowing due to uncinate spurring and facet arthropathy.    MRA Head 12/10/2015 75% stenosis of the dominant LEFT vertebral, potentially flow reducing and/or symptomatic. No anterior circulation disease affecting the LEFT ICA or LEFT MCA.     PHYSICAL EXAM Neurologic Examination:  Mental Status:  Alert, oriented, thought content appropriate. Speech without evidence of dysarthria or aphasia. Able to follow 3 step commands without difficulty.  Cranial Nerves:  II-bilateral visual fields intact III/IV/VI-Pupils were equal and reacted. Extraocular movements were full.  V/VII-no facial numbness and no facial weakness.  VIII-hearing normal.  X-normal speech and symmetrical palatal movement.  XII-midline tongue extension  Motor:  5/5 strength symmetrical throughout.  Muscle tone normal throughout. Sensory: Intact to light touch in all extremities. Deep Tendon Reflexes: 2/4 throughout Plantars: Downgoing bilaterally  Cerebellar: Normal finger to nose and heel to shin bilaterally. Gait: not tested       ASSESSMENT/PLAN [name] is a 83 y.o. female with history of hypertension, coronary artery disease, previous MI, diabetes mellitus, and  hyperlipidemia with statin allergy presenting with right hemiparesis and dysarthria. She did not receive IV t-PA due to late presentation.  Strokes:  Dominant infarct - embolic from an unknown source.  Resultant    MRI - Multifocal areas of LEFT hemisphere infarction affecting the basal ganglia, deep white matter, and thalamus.  MRA  75% stenosis of the dominant LEFT vertebral,  Carotid Doppler - 1-39% ICA plaquing. Vertebral artery flow antegrade with abnormal waveform on the right.   2D Echo - EF 60-65%. No cardiac source of emboli identified.  LDL - 237  HgbA1c pending  VTE prophylaxis - SCDs Diet heart healthy/carb modified Room service appropriate?: Yes; Fluid consistency:: Thin Diet - low sodium heart healthy  aspirin 81 mg daily prior to admission, now on aspirin 325 mg daily  Patient counseled to be compliant with her antithrombotic medications  Ongoing aggressive stroke risk factor management Therapy recommendations: SNF Disposition: SNF Hypertension  Stable  Permissive hypertension (OK if < 220/120) but gradually normalize in 5-7 days  Hyperlipidemia  Home meds:  No lipid lowering medications prior to admission. Statin allergy.  LDL 237, goal < 70  Statin allergy  Try Zetia   Diabetes  HgbA1c pending, goal < 7.0  Uncontrolled   Other Stroke Risk Factors  Advanced age  Family hx stroke (Mother)  Coronary artery disease   Other Active Problems  Renal insufficiency - improving  Anemia   PLAN  Will  cancelTEE and possible loop as feel infarcts are from small vessel disease   Stroke team will sign off. Call for questions  Hospital day # 3     12/12/2015, 3:20 PM       Annaleigha Woo    To contact Stroke Continuity provider, please refer to [email or web address] After hours, contact General Neurology

## 2015-12-12 NOTE — Progress Notes (Signed)
Pt. And family have requested [facility].  Arrangements have been made for transport to [facility] later today.  I will sign off.    [place] Admissions Coordinator Cell [phone] Office [phone]

## 2015-12-13 LAB — URINE CULTURE

## 2015-12-20 DIAGNOSIS — R262 Difficulty in walking, not elsewhere classified: Secondary | ICD-10-CM | POA: Diagnosis not present

## 2015-12-20 DIAGNOSIS — I639 Cerebral infarction, unspecified: Secondary | ICD-10-CM | POA: Diagnosis not present

## 2015-12-20 DIAGNOSIS — E785 Hyperlipidemia, unspecified: Secondary | ICD-10-CM | POA: Diagnosis not present

## 2015-12-20 DIAGNOSIS — I119 Hypertensive heart disease without heart failure: Secondary | ICD-10-CM | POA: Diagnosis not present

## 2015-12-22 ENCOUNTER — Ambulatory Visit: Payer: Medicare Other

## 2016-01-02 DIAGNOSIS — I639 Cerebral infarction, unspecified: Secondary | ICD-10-CM | POA: Diagnosis not present

## 2016-01-04 DIAGNOSIS — I639 Cerebral infarction, unspecified: Secondary | ICD-10-CM | POA: Diagnosis not present

## 2016-01-06 DIAGNOSIS — I639 Cerebral infarction, unspecified: Secondary | ICD-10-CM | POA: Diagnosis not present

## 2016-01-10 DIAGNOSIS — I639 Cerebral infarction, unspecified: Secondary | ICD-10-CM | POA: Diagnosis not present

## 2016-01-10 DIAGNOSIS — M4802 Spinal stenosis, cervical region: Secondary | ICD-10-CM | POA: Diagnosis not present

## 2016-01-10 DIAGNOSIS — R531 Weakness: Secondary | ICD-10-CM | POA: Diagnosis not present

## 2016-01-10 DIAGNOSIS — I6990 Unspecified sequelae of unspecified cerebrovascular disease: Secondary | ICD-10-CM | POA: Diagnosis not present

## 2016-01-10 DIAGNOSIS — Z79899 Other long term (current) drug therapy: Secondary | ICD-10-CM | POA: Diagnosis not present

## 2016-01-10 DIAGNOSIS — G8191 Hemiplegia, unspecified affecting right dominant side: Secondary | ICD-10-CM | POA: Diagnosis not present

## 2016-01-11 DIAGNOSIS — I639 Cerebral infarction, unspecified: Secondary | ICD-10-CM | POA: Diagnosis not present

## 2016-01-12 DIAGNOSIS — I639 Cerebral infarction, unspecified: Secondary | ICD-10-CM | POA: Diagnosis not present

## 2016-01-16 DIAGNOSIS — I639 Cerebral infarction, unspecified: Secondary | ICD-10-CM | POA: Diagnosis not present

## 2016-01-18 DIAGNOSIS — I639 Cerebral infarction, unspecified: Secondary | ICD-10-CM | POA: Diagnosis not present

## 2016-01-19 DIAGNOSIS — I639 Cerebral infarction, unspecified: Secondary | ICD-10-CM | POA: Diagnosis not present

## 2016-01-23 DIAGNOSIS — I639 Cerebral infarction, unspecified: Secondary | ICD-10-CM | POA: Diagnosis not present

## 2016-01-26 DIAGNOSIS — I639 Cerebral infarction, unspecified: Secondary | ICD-10-CM | POA: Diagnosis not present

## 2016-02-02 DIAGNOSIS — I10 Essential (primary) hypertension: Secondary | ICD-10-CM | POA: Diagnosis not present

## 2016-02-07 ENCOUNTER — Encounter (HOSPITAL_COMMUNITY): Payer: Self-pay | Admitting: Nurse Practitioner

## 2016-02-07 ENCOUNTER — Emergency Department (HOSPITAL_COMMUNITY)
Admission: EM | Admit: 2016-02-07 | Discharge: 2016-02-08 | Disposition: A | Discharge status: Home / Self Care | Payer: Medicare Other | Attending: Emergency Medicine | Admitting: Emergency Medicine

## 2016-02-07 ENCOUNTER — Emergency Department (HOSPITAL_COMMUNITY): Payer: Medicare Other

## 2016-02-07 DIAGNOSIS — Z79899 Other long term (current) drug therapy: Secondary | ICD-10-CM | POA: Insufficient documentation

## 2016-02-07 DIAGNOSIS — R202 Paresthesia of skin: Secondary | ICD-10-CM | POA: Diagnosis not present

## 2016-02-07 DIAGNOSIS — F329 Major depressive disorder, single episode, unspecified: Secondary | ICD-10-CM | POA: Insufficient documentation

## 2016-02-07 DIAGNOSIS — Z951 Presence of aortocoronary bypass graft: Secondary | ICD-10-CM | POA: Diagnosis not present

## 2016-02-07 DIAGNOSIS — R42 Dizziness and giddiness: Secondary | ICD-10-CM | POA: Diagnosis not present

## 2016-02-07 DIAGNOSIS — Z8673 Personal history of transient ischemic attack (TIA), and cerebral infarction without residual deficits: Secondary | ICD-10-CM | POA: Diagnosis not present

## 2016-02-07 DIAGNOSIS — I10 Essential (primary) hypertension: Secondary | ICD-10-CM | POA: Insufficient documentation

## 2016-02-07 DIAGNOSIS — I252 Old myocardial infarction: Secondary | ICD-10-CM | POA: Diagnosis not present

## 2016-02-07 DIAGNOSIS — Z7982 Long term (current) use of aspirin: Secondary | ICD-10-CM | POA: Diagnosis not present

## 2016-02-07 DIAGNOSIS — I2510 Atherosclerotic heart disease of native coronary artery without angina pectoris: Secondary | ICD-10-CM | POA: Insufficient documentation

## 2016-02-07 DIAGNOSIS — E119 Type 2 diabetes mellitus without complications: Secondary | ICD-10-CM | POA: Diagnosis not present

## 2016-02-07 DIAGNOSIS — R55 Syncope and collapse: Secondary | ICD-10-CM | POA: Insufficient documentation

## 2016-02-07 DIAGNOSIS — R0602 Shortness of breath: Secondary | ICD-10-CM | POA: Diagnosis not present

## 2016-02-07 HISTORY — DX: Cerebral infarction, unspecified: I63.9

## 2016-02-07 LAB — CBC
HCT: 33 % — ABNORMAL LOW (ref 36.0–46.0)
Hemoglobin: 9.8 g/dL — ABNORMAL LOW (ref 12.0–15.0)
MCH: 20.8 pg — ABNORMAL LOW (ref 26.0–34.0)
MCHC: 29.7 g/dL — ABNORMAL LOW (ref 30.0–36.0)
MCV: 70.1 fL — ABNORMAL LOW (ref 78.0–100.0)
Platelets: 384 10*3/uL (ref 150–400)
RBC: 4.71 MIL/uL (ref 3.87–5.11)
RDW: 17.2 % — ABNORMAL HIGH (ref 11.5–15.5)
WBC: 10.4 10*3/uL (ref 4.0–10.5)

## 2016-02-07 LAB — COMPREHENSIVE METABOLIC PANEL
ALT: 21 U/L (ref 14–54)
AST: 32 U/L (ref 15–41)
Albumin: 3.4 g/dL — ABNORMAL LOW (ref 3.5–5.0)
Alkaline Phosphatase: 75 U/L (ref 38–126)
Anion gap: 10 (ref 5–15)
BUN: 17 mg/dL (ref 6–20)
CO2: 22 mmol/L (ref 22–32)
Calcium: 8.6 mg/dL — ABNORMAL LOW (ref 8.9–10.3)
Chloride: 101 mmol/L (ref 101–111)
Creatinine, Ser: 0.94 mg/dL (ref 0.44–1.00)
GFR calc Af Amer: >60 mL/min (ref >60)
GFR calc non Af Amer: 53 mL/min — ABNORMAL LOW (ref >60)
Glucose, Bld: 128 mg/dL — ABNORMAL HIGH (ref 65–99)
Potassium: 4.1 mmol/L (ref 3.5–5.1)
Sodium: 133 mmol/L — ABNORMAL LOW (ref 135–145)
Total Bilirubin: 0.2 mg/dL — ABNORMAL LOW (ref 0.3–1.2)
Total Protein: 6.6 g/dL (ref 6.5–8.1)

## 2016-02-07 LAB — I-STAT CHEM 8, ED
BUN: 21 mg/dL — ABNORMAL HIGH (ref 6–20)
Calcium, Ion: 1.07 mmol/L — ABNORMAL LOW (ref 1.13–1.30)
Chloride: 100 mmol/L — ABNORMAL LOW (ref 101–111)
Creatinine, Ser: 1 mg/dL (ref 0.44–1.00)
Glucose, Bld: 127 mg/dL — ABNORMAL HIGH (ref 65–99)
HCT: 34 % — ABNORMAL LOW (ref 36.0–46.0)
Hemoglobin: 11.6 g/dL — ABNORMAL LOW (ref 12.0–15.0)
Potassium: 4.1 mmol/L (ref 3.5–5.1)
Sodium: 136 mmol/L (ref 135–145)
TCO2: 26 mmol/L (ref 0–100)

## 2016-02-07 LAB — DIFFERENTIAL
Basophils Absolute: 0 10*3/uL (ref 0.0–0.1)
Basophils Relative: 0 %
Eosinophils Absolute: 0.2 10*3/uL (ref 0.0–0.7)
Eosinophils Relative: 2 %
Lymphocytes Relative: 24 %
Lymphs Abs: 2.5 10*3/uL (ref 0.7–4.0)
Monocytes Absolute: 0.9 10*3/uL (ref 0.1–1.0)
Monocytes Relative: 9 %
Neutro Abs: 6.8 10*3/uL (ref 1.7–7.7)
Neutrophils Relative %: 65 %

## 2016-02-07 LAB — I-STAT TROPONIN, ED: Troponin i, poc: 0 ng/mL (ref 0.00–0.08)

## 2016-02-07 LAB — APTT: aPTT: 24 seconds (ref 24–37)

## 2016-02-07 LAB — PROTIME-INR
INR: 1.08 (ref 0.00–1.49)
Prothrombin Time: 14.2 seconds (ref 11.6–15.2)

## 2016-02-07 NOTE — ED Notes (Signed)
Patient transported to MRI 

## 2016-02-07 NOTE — ED Provider Notes (Signed)
CSN: [id number]     Arrival date & time 02/07/16  1829 History   First MD Initiated Contact with Patient 02/07/16 2124     Chief Complaint  Patient presents with  . Dizziness     The history is provided by the patient and a relative. No language interpreter was used.   [name] is a 82 y.o. female who presents to the Emergency Department complaining of dizziness.  She has a history of CVA back in April of this year and was started on full dose aspirin. She has been feeling well since that time until today when she woke up with dizziness. She describes it as a sensation that she is going to pass out. She also has generalized weakness. When she awoke this morning her right arm was numb and had a pins and needles type sensation. The sensation lasted most of the day but has resolved now. She denies any fevers, chest pain, abdominal pain, nausea, vomiting, black or bloody stools. She has chronic shortness of breath and is slightly worse now than it has been. She lives at home alone.    Past Medical History  Diagnosis Date  . CAD (coronary artery disease)   . HTN (hypertension)   . Dyslipidemia   . Cardiomyopathy, ischemic     EF 40% at the time cath  . Arthritis   . Anemia     hx  . Myocardial infarction (HCC)     mild  . Diabetes mellitus without complication (HCC)     borderline no meds  . Depression   . Urinary frequency   . GERD (gastroesophageal reflux disease)   . Stroke [facility])    Past Surgical History  Procedure Laterality Date  . Coronary artery bypass graft      LIMA to the LAD, SVG to diagonal, SVG to obtuse marginal, SVG to PDA..  2008  . Shoulder arthroscopy w/ rotator cuff repair Right     04  . Appendectomy    . Tonsillectomy    . Cesarean section      x2  . Lumbar laminectomy/decompression microdiscectomy N/A 05/01/2013    Procedure: LUMBAR LAMINECTOMY/DECOMPRESSION MICRODISCECTOMY 2 LEVELS;  Surgeon: Winfield Cunas, MD;  Location: [place] [facility];  Service:  Neurosurgery;  Laterality: N/A;  Lumbar Three-Four, Four-Five Laminectomies   . Lumbar laminectomy/decompression microdiscectomy N/A 07/14/2014    Procedure: LUMBAR LAMINECTOMY/DECOMPRESSION MICRODISCECTOMY 1 LEVEL LUMBAR TWO-THREE;  Surgeon: Ashok Pall, MD;  Location: [place] [facility];  Service: Neurosurgery;  Laterality: N/A;   Family History  Problem Relation Age of Onset  . Stroke Mother   . Heart attack Father    Social History  Substance Use Topics  . Smoking status: Never Smoker   . Smokeless tobacco: None  . Alcohol Use: No   OB History    No data available     Review of Systems  All other systems reviewed and are negative.     Allergies  Atorvastatin; Statins; and Penicillins  Home Medications   Prior to Admission medications   Medication Sig Start Date End Date Taking? Authorizing Provider  aspirin EC 325 MG EC tablet Take 1 tablet (325 mg total) by mouth daily. 12/12/15   Theodis Blaze, MD  cyclobenzaprine (FLEXERIL) 10 MG tablet Take 10 mg by mouth 3 (three) times daily as needed for muscle spasms.    Historical Provider, MD  ezetimibe (ZETIA) 10 MG tablet Take 1 tablet (10 mg total) by mouth daily. 12/12/15  Theodis Blaze, MD  fish oil-omega-3 fatty acids 1000 MG capsule Take 1 g by mouth daily.     Historical Provider, MD  furosemide (LASIX) 20 MG tablet Take 20 mg by mouth 2 (two) times daily as needed for fluid.     Historical Provider, MD  ibuprofen (ADVIL,MOTRIN) 200 MG tablet Take 400 mg by mouth every 6 (six) hours as needed for moderate pain.    Historical Provider, MD  lisinopril (PRINIVIL,ZESTRIL) 20 MG tablet Take 20 mg by mouth daily. 11/28/15   Historical Provider, MD  omeprazole (PRILOSEC) 20 MG capsule Take 20 mg by mouth daily.      Historical Provider, MD  sertraline (ZOLOFT) 100 MG tablet Take 100 mg by mouth at bedtime.    Historical Provider, MD  timolol (TIMOPTIC) 0.5 % ophthalmic solution Place 1 drop into both eyes every morning. 09/02/15    Historical Provider, MD   BP 167/58 mmHg  Pulse 68  Temp(Src) 98.3 F (36.8 C) (Oral)  Resp 18  SpO2 95% Physical Exam  Constitutional: She is oriented to person, place, and time. She appears well-developed and well-nourished.  HENT:  Head: Normocephalic and atraumatic.  Eyes: EOM are normal. Pupils are equal, round, and reactive to light.  Cardiovascular: Normal rate and regular rhythm.   No murmur heard. Pulmonary/Chest: Effort normal and breath sounds normal. No respiratory distress.  Abdominal: Soft. There is no tenderness. There is no rebound and no guarding.  Genitourinary:  Nontender rectal exam with brown stool, heme negative  Musculoskeletal: She exhibits no edema or tenderness.  Neurological: She is alert and oriented to person, place, and time. No cranial nerve deficit. Coordination normal.  5 out of 5 strength in all 4 extremities with sensation to light touch intact in all 4 extremities.  Skin: Skin is warm and dry.  Psychiatric: She has a normal mood and affect. Her behavior is normal.  Nursing note and vitals reviewed.   ED Course  Procedures (including critical care time) Labs Review Labs Reviewed  CBC - Abnormal; Notable for the following:    Hemoglobin 9.8 (*)    HCT 33.0 (*)    MCV 70.1 (*)    MCH 20.8 (*)    MCHC 29.7 (*)    RDW 17.2 (*)    All other components within normal limits  COMPREHENSIVE METABOLIC PANEL - Abnormal; Notable for the following:    Sodium 133 (*)    Glucose, Bld 128 (*)    Calcium 8.6 (*)    Albumin 3.4 (*)    Total Bilirubin 0.2 (*)    GFR calc non Af Amer 53 (*)    All other components within normal limits  I-STAT CHEM 8, ED - Abnormal; Notable for the following:    Chloride 100 (*)    BUN 21 (*)    Glucose, Bld 127 (*)    Calcium, Ion 1.07 (*)    Hemoglobin 11.6 (*)    HCT 34.0 (*)    All other components within normal limits  URINE CULTURE  PROTIME-INR  APTT  DIFFERENTIAL  URINALYSIS, ROUTINE W REFLEX MICROSCOPIC  (NOT AT [facility])  I-STAT TROPOININ, ED  CBG MONITORING, ED    Imaging Review Ct Head Wo Contrast  02/07/2016  CLINICAL DATA:  Dizziness since waking up this morning with right hand tingling and numbness. EXAM: CT HEAD WITHOUT CONTRAST TECHNIQUE: Contiguous axial images were obtained from the base of the skull through the vertex without intravenous contrast. COMPARISON:  Brain MRI  12/09/2015.  Head CT scan 12/05/2015. FINDINGS: Cortical atrophy is again seen. Remote infarct in the left thalamus and posterior limb of the left internal capsule is seen. No evidence of acute infarction, hemorrhage, mass lesion, mass effect, midline shift or abnormal extra-axial fluid collection is seen. There is no hydrocephalus pneumocephalus. The calvarium is intact. IMPRESSION: No acute abnormality. Electronically Signed   By: Inge Rise M.D.   On: 02/07/2016 19:17   Mr Brain Wo Contrast (neuro Protocol)  02/07/2016  CLINICAL DATA:  Dizziness beginning this morning, now improving. One-week of neck and shoulder pain, dyspnea. Recent stroke. Evaluate RIGHT-sided numbness. History of hypertension, dyslipidemia, diabetes. EXAM: MRI HEAD WITHOUT CONTRAST TECHNIQUE: Multiplanar, multiecho pulse sequences of the brain and surrounding structures were obtained without intravenous contrast. COMPARISON:  MRI head December 09, 2015 and CT HEAD February 07, 2016 at 1859 hours FINDINGS: INTRACRANIAL CONTENTS: No reduced diffusion to suggest acute ischemia. No susceptibility artifact to suggest hemorrhage. The ventricles and sulci are normal for patient's age. No suspicious parenchymal signal, masses or mass effect. A few scattered subcentimeter supratentorial white matter T2 hyperintensities are less than expected for age, most compatible with chronic small vessel ischemic disease. Old LEFT thalamus and LEFT basal ganglia lacunar infarcts. No abnormal extra-axial fluid collections. No extra-axial masses though, contrast enhanced sequences  would be more sensitive. Normal major intracranial vascular flow voids present at skull base. ORBITS: The included ocular globes and orbital contents are non-suspicious. SINUSES: The mastoid air-cells and included paranasal sinuses are well-aerated. SKULL/SOFT TISSUES: No abnormal sellar expansion. No suspicious calvarial bone marrow signal. Craniocervical junction maintained. IMPRESSION: No acute intracranial process, specifically no acute ischemia. Old LEFT basal ganglia and thalamus lacunar infarcts. Mild chronic small vessel ischemic disease. Electronically Signed   By: Elon Alas M.D.   On: 02/07/2016 22:59   I have personally reviewed and evaluated these images and lab results as part of my medical decision-making.   EKG Interpretation   Date/Time:  Tuesday February 07 2016 18:34:48 EDT Ventricular Rate:  70 PR Interval:  140 QRS Duration: 124 QT Interval:  434 QTC Calculation: 468 R Axis:   41 Text Interpretation:  Normal sinus rhythm Right bundle branch block  Abnormal ECG Confirmed by Hazle Coca [phone]) on 02/07/2016 6:46:59 PM      MDM   Final diagnoses:  Near syncope    Patient here for evaluation of dizziness and near syncopal type symptoms. She had tingling in her hand earlier today that happened with her prior stroke. There is no current tingling or weakness. MRI is negative for acute ischemia. CBC with mild anemia compared to priors but she has heme-negative brown stool.  Presentation are consistent with ACS, PE, CHF. On recheck in the department and she is not orthostatic or feeling dizzy or lightheaded. Discussed with patient unclear cause of symptoms. Recommend close outpatient follow-up with repeat CBC and sodium levels. Patient and daughter are in agreement with plan.    Quintella Reichert, MD 02/08/16 [id number]

## 2016-02-07 NOTE — ED Notes (Signed)
Pt c/o dizziness when she woke up this morning. Her R hand also felt like it was "asleep" when she woke up but that has gotten better throughout the day. She also c/o 1 week history neck and bilateral shoulder pain and DOE.Marland Kitchen She is concerned because she recently had a stroke. She is alert and breathing easily

## 2016-02-08 LAB — URINALYSIS, ROUTINE W REFLEX MICROSCOPIC
Bilirubin Urine: NEGATIVE
Glucose, UA: NEGATIVE mg/dL
Hgb urine dipstick: NEGATIVE
Ketones, ur: NEGATIVE mg/dL
Nitrite: NEGATIVE
Protein, ur: NEGATIVE mg/dL
Specific Gravity, Urine: 1.01 (ref 1.005–1.030)
pH: 6.5 (ref 5.0–8.0)

## 2016-02-08 LAB — URINE MICROSCOPIC-ADD ON: RBC / HPF: NONE SEEN RBC/hpf (ref 0–5)

## 2016-02-08 NOTE — Discharge Instructions (Signed)
Please follow up with your doctor for recheck of your blood count (hemoglobin) and sodium.  The cause of your symptoms was not identified today.     Near-Syncope Near-syncope (commonly known as near fainting) is sudden weakness, dizziness, or feeling like you might pass out. During an episode of near-syncope, you may also develop pale skin, have tunnel vision, or feel sick to your stomach (nauseous). Near-syncope may occur when getting up after sitting or while standing for a long time. It is caused by a sudden decrease in blood flow to the brain. This decrease can result from various causes or triggers, most of which are not serious. However, because near-syncope can sometimes be a sign of something serious, a medical evaluation is required. The specific cause is often not determined. HOME CARE INSTRUCTIONS  Monitor your condition for any changes. The following actions may help to alleviate any discomfort you are experiencing:  Have someone stay with you until you feel stable.  Lie down right away and prop your feet up if you start feeling like you might faint. Breathe deeply and steadily. Wait until all the symptoms have passed. Most of these episodes last only a few minutes. You may feel tired for several hours.   Drink enough fluids to keep your urine clear or pale yellow.   If you are taking blood pressure or heart medicine, get up slowly when seated or lying down. Take several minutes to sit and then stand. This can reduce dizziness.  Follow up with your health care provider as directed. SEEK IMMEDIATE MEDICAL CARE IF:   You have a severe headache.   You have unusual pain in the chest, abdomen, or back.   You are bleeding from the mouth or rectum, or you have black or tarry stool.   You have an irregular or very fast heartbeat.   You have repeated fainting or have seizure-like jerking during an episode.   You faint when sitting or lying down.   You have confusion.    You have difficulty walking.   You have severe weakness.   You have vision problems.  MAKE SURE YOU:   Understand these instructions.  Will watch your condition.  Will get help right away if you are not doing well or get worse.   This information is not intended to replace advice given to you by your health care provider. Make sure you discuss any questions you have with your health care provider.   Document Released: 08/13/2005 Document Revised: 08/18/2013 Document Reviewed: 01/16/2013 Elsevier Interactive Patient Education [employer].

## 2016-02-10 LAB — URINE CULTURE: Culture: >=100000 — AB

## 2016-02-11 ENCOUNTER — Telehealth (HOSPITAL_BASED_OUTPATIENT_CLINIC_OR_DEPARTMENT_OTHER): Payer: Self-pay

## 2016-02-11 NOTE — Telephone Encounter (Signed)
Post ED Visit - Positive Culture Follow-up  Culture report reviewed by antimicrobial stewardship pharmacist:  []  Elenor Quinones, Pharm.D. []  Heide Guile, Pharm.D., BCPS []  Parks Neptune, Pharm.D. []  Alycia Rossetti, Pharm.D., BCPS []  Wilson, [place].D., BCPS, AAHIVP []  Legrand Como, Pharm.D., BCPS, AAHIVP []  Milus Glazier, Pharm.D. []  Stephens November, [place].D. Angela Cox Pharm D Positive urine culture: no further patient follow-up is required at this time.  Genia Del 02/11/2016, 9:34 AM

## 2016-02-22 DIAGNOSIS — E119 Type 2 diabetes mellitus without complications: Secondary | ICD-10-CM | POA: Diagnosis not present

## 2016-02-22 DIAGNOSIS — I110 Hypertensive heart disease with heart failure: Secondary | ICD-10-CM | POA: Diagnosis not present

## 2016-02-22 DIAGNOSIS — D649 Anemia, unspecified: Secondary | ICD-10-CM | POA: Diagnosis not present

## 2016-02-22 DIAGNOSIS — E782 Mixed hyperlipidemia: Secondary | ICD-10-CM | POA: Diagnosis not present

## 2016-02-22 DIAGNOSIS — M4806 Spinal stenosis, lumbar region: Secondary | ICD-10-CM | POA: Diagnosis not present

## 2016-02-22 DIAGNOSIS — K219 Gastro-esophageal reflux disease without esophagitis: Secondary | ICD-10-CM | POA: Diagnosis not present

## 2016-02-22 DIAGNOSIS — E1121 Type 2 diabetes mellitus with diabetic nephropathy: Secondary | ICD-10-CM | POA: Diagnosis not present

## 2016-02-24 DIAGNOSIS — D500 Iron deficiency anemia secondary to blood loss (chronic): Secondary | ICD-10-CM | POA: Diagnosis not present

## 2016-03-01 DIAGNOSIS — Z1211 Encounter for screening for malignant neoplasm of colon: Secondary | ICD-10-CM | POA: Diagnosis not present

## 2016-03-07 DIAGNOSIS — S93402A Sprain of unspecified ligament of left ankle, initial encounter: Secondary | ICD-10-CM | POA: Diagnosis not present

## 2016-03-19 DIAGNOSIS — I110 Hypertensive heart disease with heart failure: Secondary | ICD-10-CM | POA: Diagnosis not present

## 2016-03-19 DIAGNOSIS — D500 Iron deficiency anemia secondary to blood loss (chronic): Secondary | ICD-10-CM | POA: Diagnosis not present

## 2016-03-19 DIAGNOSIS — S93402A Sprain of unspecified ligament of left ankle, initial encounter: Secondary | ICD-10-CM | POA: Diagnosis not present

## 2016-03-19 DIAGNOSIS — W1849XA Other slipping, tripping and stumbling without falling, initial encounter: Secondary | ICD-10-CM | POA: Diagnosis not present

## 2016-04-13 DIAGNOSIS — H40003 Preglaucoma, unspecified, bilateral: Secondary | ICD-10-CM | POA: Diagnosis not present

## 2016-04-13 DIAGNOSIS — H26499 Other secondary cataract, unspecified eye: Secondary | ICD-10-CM | POA: Diagnosis not present

## 2016-04-13 DIAGNOSIS — H04123 Dry eye syndrome of bilateral lacrimal glands: Secondary | ICD-10-CM | POA: Diagnosis not present

## 2016-05-13 DIAGNOSIS — N3001 Acute cystitis with hematuria: Secondary | ICD-10-CM | POA: Diagnosis not present

## 2016-05-24 ENCOUNTER — Telehealth: Payer: Self-pay | Admitting: Cardiology

## 2016-05-24 NOTE — Telephone Encounter (Signed)
New message   Pt verbalized that she is calling to speak to rn about the pt diagnosis: looking for varication to see if pt is a heart failure pt

## 2016-05-24 NOTE — Telephone Encounter (Signed)
Spoke with Janett Billow, aware the patient does not carry a diagnosis of heart failure. Confirmed EF% was 60-65% on last echo. She reports the medical doctor has systolic chf in their chart. She wants to know from dr hochrein feels the patient could benefit from a scale for daily weight and monitoring for heart failure symptoms. Will forward to dr hochrein to review and advise.

## 2016-05-24 NOTE — Telephone Encounter (Signed)
I don't see recurrent volume issues as severe.  She is 88.  I don't think she needs daily weights.

## 2016-05-24 NOTE — Telephone Encounter (Signed)
Left message for Janett Billow of dr hochrein's recommendations

## 2016-05-28 DIAGNOSIS — Z0000 Encounter for general adult medical examination without abnormal findings: Secondary | ICD-10-CM | POA: Diagnosis not present

## 2016-05-28 DIAGNOSIS — Z23 Encounter for immunization: Secondary | ICD-10-CM | POA: Diagnosis not present

## 2016-05-28 DIAGNOSIS — I110 Hypertensive heart disease with heart failure: Secondary | ICD-10-CM | POA: Diagnosis not present

## 2016-05-28 DIAGNOSIS — M818 Other osteoporosis without current pathological fracture: Secondary | ICD-10-CM | POA: Diagnosis not present

## 2016-06-04 DIAGNOSIS — M48061 Spinal stenosis, lumbar region without neurogenic claudication: Secondary | ICD-10-CM | POA: Diagnosis not present

## 2016-06-04 DIAGNOSIS — I110 Hypertensive heart disease with heart failure: Secondary | ICD-10-CM | POA: Diagnosis not present

## 2016-06-04 DIAGNOSIS — E782 Mixed hyperlipidemia: Secondary | ICD-10-CM | POA: Diagnosis not present

## 2016-06-04 DIAGNOSIS — R5383 Other fatigue: Secondary | ICD-10-CM | POA: Diagnosis not present

## 2016-06-04 DIAGNOSIS — E1121 Type 2 diabetes mellitus with diabetic nephropathy: Secondary | ICD-10-CM | POA: Diagnosis not present

## 2016-06-04 DIAGNOSIS — I10 Essential (primary) hypertension: Secondary | ICD-10-CM | POA: Diagnosis not present

## 2016-06-04 DIAGNOSIS — K219 Gastro-esophageal reflux disease without esophagitis: Secondary | ICD-10-CM | POA: Diagnosis not present

## 2016-06-18 DIAGNOSIS — I25810 Atherosclerosis of coronary artery bypass graft(s) without angina pectoris: Secondary | ICD-10-CM | POA: Diagnosis not present

## 2016-06-18 DIAGNOSIS — E1165 Type 2 diabetes mellitus with hyperglycemia: Secondary | ICD-10-CM | POA: Diagnosis not present

## 2016-06-18 DIAGNOSIS — I252 Old myocardial infarction: Secondary | ICD-10-CM | POA: Diagnosis not present

## 2016-06-18 DIAGNOSIS — E7800 Pure hypercholesterolemia, unspecified: Secondary | ICD-10-CM | POA: Diagnosis not present

## 2016-06-18 DIAGNOSIS — E1129 Type 2 diabetes mellitus with other diabetic kidney complication: Secondary | ICD-10-CM | POA: Diagnosis not present

## 2016-06-18 DIAGNOSIS — K2970 Gastritis, unspecified, without bleeding: Secondary | ICD-10-CM | POA: Diagnosis not present

## 2016-06-18 DIAGNOSIS — Z951 Presence of aortocoronary bypass graft: Secondary | ICD-10-CM | POA: Diagnosis not present

## 2016-06-18 DIAGNOSIS — Q2733 Arteriovenous malformation of digestive system vessel: Secondary | ICD-10-CM | POA: Diagnosis not present

## 2016-06-18 DIAGNOSIS — F329 Major depressive disorder, single episode, unspecified: Secondary | ICD-10-CM | POA: Diagnosis not present

## 2016-06-18 DIAGNOSIS — M1990 Unspecified osteoarthritis, unspecified site: Secondary | ICD-10-CM | POA: Diagnosis not present

## 2016-06-18 DIAGNOSIS — M810 Age-related osteoporosis without current pathological fracture: Secondary | ICD-10-CM | POA: Diagnosis not present

## 2016-06-18 DIAGNOSIS — Z7982 Long term (current) use of aspirin: Secondary | ICD-10-CM | POA: Diagnosis not present

## 2016-06-18 DIAGNOSIS — Z79899 Other long term (current) drug therapy: Secondary | ICD-10-CM | POA: Diagnosis not present

## 2016-06-18 DIAGNOSIS — Z880 Allergy status to penicillin: Secondary | ICD-10-CM | POA: Diagnosis not present

## 2016-06-18 DIAGNOSIS — Z888 Allergy status to other drugs, medicaments and biological substances status: Secondary | ICD-10-CM | POA: Diagnosis not present

## 2016-06-18 DIAGNOSIS — K2900 Acute gastritis without bleeding: Secondary | ICD-10-CM | POA: Diagnosis not present

## 2016-06-18 DIAGNOSIS — I2510 Atherosclerotic heart disease of native coronary artery without angina pectoris: Secondary | ICD-10-CM | POA: Diagnosis not present

## 2016-06-18 DIAGNOSIS — K279 Peptic ulcer, site unspecified, unspecified as acute or chronic, without hemorrhage or perforation: Secondary | ICD-10-CM | POA: Diagnosis not present

## 2016-06-18 DIAGNOSIS — R404 Transient alteration of awareness: Secondary | ICD-10-CM | POA: Diagnosis not present

## 2016-06-18 DIAGNOSIS — K219 Gastro-esophageal reflux disease without esophagitis: Secondary | ICD-10-CM | POA: Diagnosis not present

## 2016-06-18 DIAGNOSIS — I10 Essential (primary) hypertension: Secondary | ICD-10-CM | POA: Diagnosis not present

## 2016-06-18 DIAGNOSIS — D62 Acute posthemorrhagic anemia: Secondary | ICD-10-CM | POA: Diagnosis not present

## 2016-06-18 DIAGNOSIS — R55 Syncope and collapse: Secondary | ICD-10-CM | POA: Diagnosis not present

## 2016-06-18 DIAGNOSIS — E871 Hypo-osmolality and hyponatremia: Secondary | ICD-10-CM | POA: Diagnosis not present

## 2016-06-18 DIAGNOSIS — K253 Acute gastric ulcer without hemorrhage or perforation: Secondary | ICD-10-CM | POA: Diagnosis not present

## 2016-06-18 DIAGNOSIS — Q2730 Arteriovenous malformation, site unspecified: Secondary | ICD-10-CM | POA: Diagnosis not present

## 2016-06-18 DIAGNOSIS — R531 Weakness: Secondary | ICD-10-CM | POA: Diagnosis not present

## 2016-06-18 DIAGNOSIS — E785 Hyperlipidemia, unspecified: Secondary | ICD-10-CM | POA: Diagnosis not present

## 2016-06-18 DIAGNOSIS — K449 Diaphragmatic hernia without obstruction or gangrene: Secondary | ICD-10-CM | POA: Diagnosis not present

## 2016-06-18 DIAGNOSIS — K270 Acute peptic ulcer, site unspecified, with hemorrhage: Secondary | ICD-10-CM | POA: Diagnosis not present

## 2016-06-18 DIAGNOSIS — K921 Melena: Secondary | ICD-10-CM | POA: Diagnosis not present

## 2016-06-20 DIAGNOSIS — D62 Acute posthemorrhagic anemia: Secondary | ICD-10-CM

## 2016-06-20 DIAGNOSIS — I2510 Atherosclerotic heart disease of native coronary artery without angina pectoris: Secondary | ICD-10-CM | POA: Diagnosis not present

## 2016-06-20 DIAGNOSIS — K279 Peptic ulcer, site unspecified, unspecified as acute or chronic, without hemorrhage or perforation: Secondary | ICD-10-CM | POA: Diagnosis not present

## 2016-06-20 DIAGNOSIS — K921 Melena: Secondary | ICD-10-CM | POA: Diagnosis not present

## 2016-06-20 DIAGNOSIS — Q2730 Arteriovenous malformation, site unspecified: Secondary | ICD-10-CM | POA: Diagnosis not present

## 2016-06-20 DIAGNOSIS — F329 Major depressive disorder, single episode, unspecified: Secondary | ICD-10-CM

## 2016-06-27 DIAGNOSIS — K273 Acute peptic ulcer, site unspecified, without hemorrhage or perforation: Secondary | ICD-10-CM | POA: Diagnosis not present

## 2016-07-05 DIAGNOSIS — K922 Gastrointestinal hemorrhage, unspecified: Secondary | ICD-10-CM | POA: Diagnosis not present

## 2016-07-05 DIAGNOSIS — K219 Gastro-esophageal reflux disease without esophagitis: Secondary | ICD-10-CM | POA: Diagnosis not present

## 2016-07-27 DIAGNOSIS — E038 Other specified hypothyroidism: Secondary | ICD-10-CM | POA: Diagnosis not present

## 2016-08-10 ENCOUNTER — Other Ambulatory Visit: Payer: Self-pay | Admitting: Cardiology

## 2016-08-10 ENCOUNTER — Ambulatory Visit (HOSPITAL_COMMUNITY)
Admission: RE | Admit: 2016-08-10 | Discharge: 2016-08-10 | Disposition: A | Discharge status: Home / Self Care | Payer: Medicare Other | Source: Ambulatory Visit | Attending: Cardiology | Admitting: Cardiology

## 2016-08-10 DIAGNOSIS — I2510 Atherosclerotic heart disease of native coronary artery without angina pectoris: Secondary | ICD-10-CM | POA: Diagnosis not present

## 2016-08-10 DIAGNOSIS — Z951 Presence of aortocoronary bypass graft: Secondary | ICD-10-CM | POA: Diagnosis not present

## 2016-08-10 DIAGNOSIS — I6523 Occlusion and stenosis of bilateral carotid arteries: Secondary | ICD-10-CM | POA: Diagnosis not present

## 2016-08-10 DIAGNOSIS — I10 Essential (primary) hypertension: Secondary | ICD-10-CM | POA: Insufficient documentation

## 2016-08-10 DIAGNOSIS — E785 Hyperlipidemia, unspecified: Secondary | ICD-10-CM | POA: Diagnosis not present

## 2016-08-10 DIAGNOSIS — Z8673 Personal history of transient ischemic attack (TIA), and cerebral infarction without residual deficits: Secondary | ICD-10-CM | POA: Insufficient documentation

## 2016-09-06 DIAGNOSIS — K3189 Other diseases of stomach and duodenum: Secondary | ICD-10-CM | POA: Diagnosis not present

## 2016-09-06 DIAGNOSIS — Z8673 Personal history of transient ischemic attack (TIA), and cerebral infarction without residual deficits: Secondary | ICD-10-CM | POA: Diagnosis not present

## 2016-09-06 DIAGNOSIS — N189 Chronic kidney disease, unspecified: Secondary | ICD-10-CM | POA: Diagnosis not present

## 2016-09-06 DIAGNOSIS — D509 Iron deficiency anemia, unspecified: Secondary | ICD-10-CM | POA: Diagnosis not present

## 2016-09-06 DIAGNOSIS — K922 Gastrointestinal hemorrhage, unspecified: Secondary | ICD-10-CM | POA: Diagnosis not present

## 2016-09-06 DIAGNOSIS — K2970 Gastritis, unspecified, without bleeding: Secondary | ICD-10-CM | POA: Diagnosis not present

## 2016-09-06 DIAGNOSIS — E119 Type 2 diabetes mellitus without complications: Secondary | ICD-10-CM | POA: Diagnosis not present

## 2016-09-06 DIAGNOSIS — Z79899 Other long term (current) drug therapy: Secondary | ICD-10-CM | POA: Diagnosis not present

## 2016-09-06 DIAGNOSIS — I2510 Atherosclerotic heart disease of native coronary artery without angina pectoris: Secondary | ICD-10-CM | POA: Diagnosis not present

## 2016-09-06 DIAGNOSIS — Z7982 Long term (current) use of aspirin: Secondary | ICD-10-CM | POA: Diagnosis not present

## 2016-09-06 DIAGNOSIS — I129 Hypertensive chronic kidney disease with stage 1 through stage 4 chronic kidney disease, or unspecified chronic kidney disease: Secondary | ICD-10-CM | POA: Diagnosis not present

## 2016-09-06 DIAGNOSIS — M1990 Unspecified osteoarthritis, unspecified site: Secondary | ICD-10-CM | POA: Diagnosis not present

## 2016-09-06 DIAGNOSIS — K219 Gastro-esophageal reflux disease without esophagitis: Secondary | ICD-10-CM | POA: Diagnosis not present

## 2016-09-06 DIAGNOSIS — K449 Diaphragmatic hernia without obstruction or gangrene: Secondary | ICD-10-CM | POA: Diagnosis not present

## 2016-09-06 DIAGNOSIS — Z09 Encounter for follow-up examination after completed treatment for conditions other than malignant neoplasm: Secondary | ICD-10-CM | POA: Diagnosis not present

## 2016-09-06 DIAGNOSIS — Z9049 Acquired absence of other specified parts of digestive tract: Secondary | ICD-10-CM | POA: Diagnosis not present

## 2016-09-06 DIAGNOSIS — Z951 Presence of aortocoronary bypass graft: Secondary | ICD-10-CM | POA: Diagnosis not present

## 2016-09-06 DIAGNOSIS — E785 Hyperlipidemia, unspecified: Secondary | ICD-10-CM | POA: Diagnosis not present

## 2016-09-06 DIAGNOSIS — K2900 Acute gastritis without bleeding: Secondary | ICD-10-CM | POA: Diagnosis not present

## 2016-09-06 DIAGNOSIS — I252 Old myocardial infarction: Secondary | ICD-10-CM | POA: Diagnosis not present

## 2016-09-08 NOTE — Progress Notes (Addendum)
HPI The patient presents for evaulation of known coronary disease.  Echo last year demonstrated some aortic sclerosis. At the last visit I stopped her metoprolol secondary to fatigue.      In April she had a CVA.  I have reviewed these records.  she had multifocal areas of left hemisphere infarction affecting the basal ganglia, deep white matter, and thalamus. MRI cervical spine showed moderate to severe stenosis at C5-6. The patient did not receive TPA. MRI of the head demonstrated  75% stenosis of the dominant left vertebral artery. No anterior circulation disease affecting the left ICA or left MCA. Echocardiogram with ejection fraction of 123456 grade 1 diastolic dysfunction. Carotid Doppler no ICA stenosis. Neurology consulted placed on aspirin 325 mg daily.  However, she had a GI bleed and was hospitalized at [facility] and had to have her ASA stopped for awhile.  She is back on 81 mg.  a did look through the notes and there was some mention of having a TEE but this was not done and I don't see any other cardiac workup. She returns for follow-up. She just doesn't feel good. She does have some residual right-sided weakness. She is back to driving a car. She stating rehabilitation but she is back home.   She denies any chest pressure, neck or arm discomfort. She's had no shortness of breath, PND or orthopnea. She's had no weight gain or edema.   Allergies  Allergen Reactions  . Atorvastatin Other (See Comments)    Muscle pain  . Statins Other (See Comments)    MUSCLE PAIN  . Penicillins Other (See Comments)    Current Outpatient Prescriptions  Medication Sig Dispense Refill  . aspirin EC 81 MG tablet Take 81 mg by mouth daily.    . fish oil-omega-3 fatty acids 1000 MG capsule Take 1 g by mouth daily.     . furosemide (LASIX) 20 MG tablet Take 20 mg by mouth 2 (two) times daily as needed for fluid.     Marland Kitchen ibuprofen (ADVIL,MOTRIN) 200 MG tablet Take 400 mg by mouth every 6 (six) hours as needed for  moderate pain.    Marland Kitchen levothyroxine (SYNTHROID, LEVOTHROID) 50 MCG tablet Take 1 tablet by mouth daily.    Marland Kitchen lisinopril (PRINIVIL,ZESTRIL) 20 MG tablet Take 20 mg by mouth daily.    Marland Kitchen LORazepam (ATIVAN) 1 MG tablet Take 1 tablet by mouth at bedtime as needed.    . pantoprazole (PROTONIX) 40 MG tablet Take 1 tablet by mouth daily.    . sertraline (ZOLOFT) 100 MG tablet Take 100 mg by mouth at bedtime.    . timolol (TIMOPTIC) 0.5 % ophthalmic solution Place 1 drop into both eyes every morning.  1   No current facility-administered medications for this visit.     Past Medical History:  Diagnosis Date  . Anemia    hx  . Arthritis   . CAD (coronary artery disease)   . Cardiomyopathy, ischemic    EF 40% at the time cath  . Depression   . Diabetes mellitus without complication (HCC)    borderline no meds  . Dyslipidemia   . GERD (gastroesophageal reflux disease)   . HTN (hypertension)   . Myocardial infarction    mild  . Stroke ([place])   . Urinary frequency     Past Surgical History:  Procedure Laterality Date  . APPENDECTOMY    . CESAREAN SECTION     x2  . CORONARY ARTERY BYPASS GRAFT  LIMA to the LAD, SVG to diagonal, SVG to obtuse marginal, SVG to PDA..  2008  . LUMBAR LAMINECTOMY/DECOMPRESSION MICRODISCECTOMY N/A 05/01/2013   Procedure: LUMBAR LAMINECTOMY/DECOMPRESSION MICRODISCECTOMY 2 LEVELS;  Surgeon: Winfield Cunas, MD;  Location: [place] [facility];  Service: Neurosurgery;  Laterality: N/A;  Lumbar Three-Four, Four-Five Laminectomies   . LUMBAR LAMINECTOMY/DECOMPRESSION MICRODISCECTOMY N/A 07/14/2014   Procedure: LUMBAR LAMINECTOMY/DECOMPRESSION MICRODISCECTOMY 1 LEVEL LUMBAR TWO-THREE;  Surgeon: Ashok Pall, MD;  Location: [place] [facility];  Service: Neurosurgery;  Laterality: N/A;  . SHOULDER ARTHROSCOPY W/ ROTATOR CUFF REPAIR Right    04  . TONSILLECTOMY      ROS:   As stated in the HPI and negative for all other systems.  PHYSICAL EXAM BP 134/72   Pulse 60   Ht 5\' 4"   (1.626 m)   Wt 152 lb (68.9 kg)   BMI 26.09 kg/m  GENERAL:  Well appearing NECK:  No jugular venous distention, waveform within normal limits, carotid upstroke brisk and symmetric, soft bilateral bruits, no thyromegaly or LUNGS:  Clear to auscultation bilaterally BACK:  No CVA tenderness CHEST:  Well healed sternotomy scar. HEART:  PMI not displaced or sustained,S1 and S2 within normal limits, no S3, no S4, no clicks, no rubs, no murmurs ABD:  Flat, positive bowel sounds normal in frequency in pitch, no bruits, no rebound, no guarding, no midline pulsatile mass, no hepatomegaly, no splenomegaly EXT:  2 plus pulses throughout, no edema, no cyanosis no clubbing  EKG:  Sinus rhythm, rate 60, axis within normal limits, right bundle branch block. 09/10/2016  ASSESSMENT AND PLAN  CAD - She has no new symptoms. We will continue aggressive risk reduction. No further studies are indicated at this time.    HYPERTENSION - Blood pressure is well controlled. She will continue on the meds as listed.  HYPERLIPIDEMIA -  She is intolerant of statins.   I will defer management of her lipids to COX,KIRSTEN, MD   CAROTID STENOSIS-   40-59% bilateral ICA stenosis which will be followed again in Dec 2018.    CVA -  I did an extensive review of her hospital records.  I will order an event monitor to screen for atrial fib.  She will remain on meds as listed.

## 2016-09-10 ENCOUNTER — Encounter: Payer: Self-pay | Admitting: Cardiology

## 2016-09-10 ENCOUNTER — Ambulatory Visit (INDEPENDENT_AMBULATORY_CARE_PROVIDER_SITE_OTHER): Payer: Medicare Other | Admitting: Cardiology

## 2016-09-10 ENCOUNTER — Encounter (INDEPENDENT_AMBULATORY_CARE_PROVIDER_SITE_OTHER): Payer: Medicare Other

## 2016-09-10 VITALS — BP 134/72 | HR 60 | Ht 64.0 in | Wt 152.0 lb

## 2016-09-10 DIAGNOSIS — I639 Cerebral infarction, unspecified: Secondary | ICD-10-CM | POA: Diagnosis not present

## 2016-09-10 NOTE — Patient Instructions (Signed)
Medication Instructions:  Continue current medications  Labwork: None Ordered  Testing/Procedures: Your physician has recommended that you wear an event monitor for 21 days. Event monitors are medical devices that record the heart's electrical activity. Doctors most often [place] these monitors to diagnose arrhythmias. Arrhythmias are problems with the speed or rhythm of the heartbeat. The monitor is a small, portable device. You can wear one while you do your normal daily activities. This is usually used to diagnose what is causing palpitations/syncope (passing out).   Follow-Up: Your physician recommends that you schedule a follow-up appointment in: 6 weeks.   Any Other Special Instructions Will Be Listed Below (If Applicable).   If you need a refill on your cardiac medications before your next appointment, please call your pharmacy.

## 2016-09-12 DIAGNOSIS — G464 Cerebellar stroke syndrome: Secondary | ICD-10-CM | POA: Diagnosis not present

## 2016-09-14 NOTE — Addendum Note (Signed)
Addended by: Vennie Homans on: 09/14/2016 09:45 AM   Modules accepted: Orders

## 2016-10-02 DIAGNOSIS — I639 Cerebral infarction, unspecified: Secondary | ICD-10-CM | POA: Diagnosis not present

## 2016-10-12 DIAGNOSIS — I110 Hypertensive heart disease with heart failure: Secondary | ICD-10-CM | POA: Diagnosis not present

## 2016-10-12 DIAGNOSIS — E1121 Type 2 diabetes mellitus with diabetic nephropathy: Secondary | ICD-10-CM | POA: Diagnosis not present

## 2016-10-12 DIAGNOSIS — E782 Mixed hyperlipidemia: Secondary | ICD-10-CM | POA: Diagnosis not present

## 2016-10-12 DIAGNOSIS — M48061 Spinal stenosis, lumbar region without neurogenic claudication: Secondary | ICD-10-CM | POA: Diagnosis not present

## 2016-10-12 DIAGNOSIS — K219 Gastro-esophageal reflux disease without esophagitis: Secondary | ICD-10-CM | POA: Diagnosis not present

## 2016-10-31 NOTE — Progress Notes (Signed)
HPI The patient presents for evaulation of known coronary disease.  Echo last year demonstrated some aortic sclerosis. At a previous visit I stopped her metoprolol secondary to fatigue.      In April she had a CVA.  She had multifocal areas of left hemisphere infarction affecting the basal ganglia, deep white matter, and thalamus. MRI cervical spine showed moderate to severe stenosis at C5-6. The patient did not receive TPA. MRI of the head demonstrated  75% stenosis of the dominant left vertebral artery. No anterior circulation disease affecting the left ICA or left MCA. Echocardiogram with ejection fraction of 95% grade 1 diastolic dysfunction. Carotid Doppler no ICA stenosis. Neurology consulted placed on aspirin 325 mg daily.  However, she had a GI bleed and was hospitalized at [facility] and had to have her ASA stopped for awhile.  She is back on 81 mg.  After the last office visit she had an event monitor without any evidence of atrial fib and with only one run of NSVT.    Since I last saw her she has done well.  The patient denies any new symptoms such as chest discomfort, neck or arm discomfort. There has been no new shortness of breath, PND or orthopnea. There have been no reported palpitations, presyncope or syncope.  She is not very active but she does some PT exercises.     Allergies  Allergen Reactions  . Atorvastatin Other (See Comments)    Muscle pain  . Statins Other (See Comments)    MUSCLE PAIN  . Penicillins Hives    CHILDHOOD ALLERGY     Current Outpatient Prescriptions  Medication Sig Dispense Refill  . aspirin EC 81 MG tablet Take 81 mg by mouth daily.    . fish oil-omega-3 fatty acids 1000 MG capsule Take 1 g by mouth daily.     . furosemide (LASIX) 20 MG tablet Take 20 mg by mouth 2 (two) times daily as needed for fluid.     Marland Kitchen ibuprofen (ADVIL,MOTRIN) 200 MG tablet Take 400 mg by mouth every 6 (six) hours as needed for moderate pain.    Marland Kitchen levothyroxine (SYNTHROID,  LEVOTHROID) 50 MCG tablet Take 1 tablet by mouth daily.    Marland Kitchen lisinopril (PRINIVIL,ZESTRIL) 20 MG tablet Take 20 mg by mouth daily.    Marland Kitchen LORazepam (ATIVAN) 1 MG tablet Take 1 tablet by mouth at bedtime as needed.    . pantoprazole (PROTONIX) 40 MG tablet Take 1 tablet by mouth daily.    . sertraline (ZOLOFT) 100 MG tablet Take 100 mg by mouth at bedtime.    . timolol (TIMOPTIC) 0.5 % ophthalmic solution Place 1 drop into both eyes every morning.  1   No current facility-administered medications for this visit.     Past Medical History:  Diagnosis Date  . Anemia    hx  . Arthritis   . CAD (coronary artery disease)   . Cardiomyopathy, ischemic    EF 40% at the time cath  . Depression   . Diabetes mellitus without complication (HCC)    borderline no meds  . Dyslipidemia   . GERD (gastroesophageal reflux disease)   . HTN (hypertension)   . Myocardial infarction    mild  . Stroke ([place])   . Urinary frequency     Past Surgical History:  Procedure Laterality Date  . APPENDECTOMY    . CESAREAN SECTION     x2  . CORONARY ARTERY BYPASS GRAFT     LIMA to  the LAD, SVG to diagonal, SVG to obtuse marginal, SVG to PDA..  2008  . LUMBAR LAMINECTOMY/DECOMPRESSION MICRODISCECTOMY N/A 05/01/2013   Procedure: LUMBAR LAMINECTOMY/DECOMPRESSION MICRODISCECTOMY 2 LEVELS;  Surgeon: Winfield Cunas, MD;  Location: [place] [facility]UR[facility] ORS;  Service: Neurosurgery;  Laterality: N/A;  Lumbar Three-Four, Four-Five Laminectomies   . LUMBAR LAMINECTOMY/DECOMPRESSION MICRODISCECTOMY N/A 07/14/2014   Procedure: LUMBAR LAMINECTOMY/DECOMPRESSION MICRODISCECTOMY 1 LEVEL LUMBAR TWO-THREE;  Surgeon: Ashok Pall, MD;  Location: [place] NEUR[facility] ORS;  Service: Neurosurgery;  Laterality: N/A;  . SHOULDER ARTHROSCOPY W/ ROTATOR CUFF REPAIR Right    04  . TONSILLECTOMY      ROS:    As stated in the HPI and negative for all other systems.  PHYSICAL EXAM BP 128/62 (BP Location: Left Arm, Patient Position: Sitting, Cuff Size: Normal)    Pulse 64   Ht 5\' 4"  (1.626 m)   Wt 154 lb (69.9 kg)   BMI 26.43 kg/m  GENERAL:  Well appearing NECK:  No jugular venous distention, waveform within normal limits, carotid upstroke brisk and symmetric, soft bilateral bruits, no thyromegaly or LUNGS:  Clear to auscultation bilaterally BACK:  No CVA tenderness CHEST:  Well healed sternotomy scar. HEART:  PMI not displaced or sustained,S1 and S2 within normal limits, no S3, no S4, no clicks, no rubs, no murmurs ABD:  Flat, positive bowel sounds normal in frequency in pitch, no bruits, no rebound, no guarding, no midline pulsatile mass, no hepatomegaly, no splenomegaly EXT:  2 plus pulses throughout, no edema, no cyanosis no clubbing   ASSESSMENT AND PLAN   CAD -  She has no new symptoms. We will continue aggressive risk reduction. No further studies are indicated at this time.  I have instructed her to exercise and walk more.  If she can when she comes back I will do a POET (Plain Old Exercise Treadmill) as her grafts are now 81 years old    HYPERTENSION -    Blood pressure is well controlled. She will continue on the meds as listed.  HYPERLIPIDEMIA -  She is intolerant of statins.   I will defer management of her lipids to COX,KIRSTEN, MD   CAROTID STENOSIS-   40-59% bilateral ICA stenosis which will be followed again in Dec 2018.    CVA -  She seems to have recovered nicely from this.

## 2016-11-01 ENCOUNTER — Ambulatory Visit (INDEPENDENT_AMBULATORY_CARE_PROVIDER_SITE_OTHER): Payer: Medicare Other | Admitting: Cardiology

## 2016-11-01 ENCOUNTER — Encounter: Payer: Self-pay | Admitting: Cardiology

## 2016-11-01 VITALS — BP 128/62 | HR 64 | Ht 64.0 in | Wt 154.0 lb

## 2016-11-01 DIAGNOSIS — I6523 Occlusion and stenosis of bilateral carotid arteries: Secondary | ICD-10-CM

## 2016-11-01 DIAGNOSIS — I2510 Atherosclerotic heart disease of native coronary artery without angina pectoris: Secondary | ICD-10-CM | POA: Diagnosis not present

## 2016-11-01 NOTE — Patient Instructions (Signed)

## 2016-11-27 DIAGNOSIS — G894 Chronic pain syndrome: Secondary | ICD-10-CM | POA: Insufficient documentation

## 2016-11-27 DIAGNOSIS — M5136 Other intervertebral disc degeneration, lumbar region: Secondary | ICD-10-CM | POA: Insufficient documentation

## 2016-11-27 DIAGNOSIS — K6289 Other specified diseases of anus and rectum: Secondary | ICD-10-CM | POA: Diagnosis not present

## 2016-11-27 DIAGNOSIS — M51369 Other intervertebral disc degeneration, lumbar region without mention of lumbar back pain or lower extremity pain: Secondary | ICD-10-CM | POA: Insufficient documentation

## 2016-11-27 DIAGNOSIS — K611 Rectal abscess: Secondary | ICD-10-CM | POA: Diagnosis not present

## 2016-12-02 DIAGNOSIS — K611 Rectal abscess: Secondary | ICD-10-CM | POA: Diagnosis not present

## 2016-12-02 DIAGNOSIS — E119 Type 2 diabetes mellitus without complications: Secondary | ICD-10-CM | POA: Diagnosis not present

## 2016-12-02 DIAGNOSIS — I2510 Atherosclerotic heart disease of native coronary artery without angina pectoris: Secondary | ICD-10-CM | POA: Diagnosis not present

## 2016-12-02 DIAGNOSIS — Z9181 History of falling: Secondary | ICD-10-CM | POA: Diagnosis not present

## 2016-12-02 DIAGNOSIS — Z7982 Long term (current) use of aspirin: Secondary | ICD-10-CM | POA: Diagnosis not present

## 2016-12-02 DIAGNOSIS — Z602 Problems related to living alone: Secondary | ICD-10-CM | POA: Diagnosis not present

## 2016-12-02 DIAGNOSIS — Z79891 Long term (current) use of opiate analgesic: Secondary | ICD-10-CM | POA: Diagnosis not present

## 2016-12-02 DIAGNOSIS — I10 Essential (primary) hypertension: Secondary | ICD-10-CM | POA: Diagnosis not present

## 2016-12-04 DIAGNOSIS — Z9181 History of falling: Secondary | ICD-10-CM | POA: Diagnosis not present

## 2016-12-04 DIAGNOSIS — I2510 Atherosclerotic heart disease of native coronary artery without angina pectoris: Secondary | ICD-10-CM | POA: Diagnosis not present

## 2016-12-04 DIAGNOSIS — Z79891 Long term (current) use of opiate analgesic: Secondary | ICD-10-CM | POA: Diagnosis not present

## 2016-12-04 DIAGNOSIS — K611 Rectal abscess: Secondary | ICD-10-CM | POA: Diagnosis not present

## 2016-12-04 DIAGNOSIS — Z602 Problems related to living alone: Secondary | ICD-10-CM | POA: Diagnosis not present

## 2016-12-04 DIAGNOSIS — I10 Essential (primary) hypertension: Secondary | ICD-10-CM | POA: Diagnosis not present

## 2016-12-04 DIAGNOSIS — E119 Type 2 diabetes mellitus without complications: Secondary | ICD-10-CM | POA: Diagnosis not present

## 2016-12-04 DIAGNOSIS — Z7982 Long term (current) use of aspirin: Secondary | ICD-10-CM | POA: Diagnosis not present

## 2016-12-07 DIAGNOSIS — I10 Essential (primary) hypertension: Secondary | ICD-10-CM | POA: Diagnosis not present

## 2016-12-07 DIAGNOSIS — Z9181 History of falling: Secondary | ICD-10-CM | POA: Diagnosis not present

## 2016-12-07 DIAGNOSIS — Z79891 Long term (current) use of opiate analgesic: Secondary | ICD-10-CM | POA: Diagnosis not present

## 2016-12-07 DIAGNOSIS — K611 Rectal abscess: Secondary | ICD-10-CM | POA: Diagnosis not present

## 2016-12-07 DIAGNOSIS — I2510 Atherosclerotic heart disease of native coronary artery without angina pectoris: Secondary | ICD-10-CM | POA: Diagnosis not present

## 2016-12-07 DIAGNOSIS — E119 Type 2 diabetes mellitus without complications: Secondary | ICD-10-CM | POA: Diagnosis not present

## 2016-12-07 DIAGNOSIS — Z7982 Long term (current) use of aspirin: Secondary | ICD-10-CM | POA: Diagnosis not present

## 2016-12-07 DIAGNOSIS — Z602 Problems related to living alone: Secondary | ICD-10-CM | POA: Diagnosis not present

## 2016-12-12 DIAGNOSIS — Z79891 Long term (current) use of opiate analgesic: Secondary | ICD-10-CM | POA: Diagnosis not present

## 2016-12-12 DIAGNOSIS — I2510 Atherosclerotic heart disease of native coronary artery without angina pectoris: Secondary | ICD-10-CM | POA: Diagnosis not present

## 2016-12-12 DIAGNOSIS — K611 Rectal abscess: Secondary | ICD-10-CM | POA: Diagnosis not present

## 2016-12-12 DIAGNOSIS — E119 Type 2 diabetes mellitus without complications: Secondary | ICD-10-CM | POA: Diagnosis not present

## 2016-12-12 DIAGNOSIS — Z602 Problems related to living alone: Secondary | ICD-10-CM | POA: Diagnosis not present

## 2016-12-12 DIAGNOSIS — Z7982 Long term (current) use of aspirin: Secondary | ICD-10-CM | POA: Diagnosis not present

## 2016-12-12 DIAGNOSIS — Z9181 History of falling: Secondary | ICD-10-CM | POA: Diagnosis not present

## 2016-12-12 DIAGNOSIS — I10 Essential (primary) hypertension: Secondary | ICD-10-CM | POA: Diagnosis not present

## 2016-12-13 DIAGNOSIS — K611 Rectal abscess: Secondary | ICD-10-CM | POA: Diagnosis not present

## 2017-01-10 DIAGNOSIS — G5603 Carpal tunnel syndrome, bilateral upper limbs: Secondary | ICD-10-CM | POA: Diagnosis not present

## 2017-01-10 DIAGNOSIS — E038 Other specified hypothyroidism: Secondary | ICD-10-CM | POA: Diagnosis not present

## 2017-01-10 DIAGNOSIS — E782 Mixed hyperlipidemia: Secondary | ICD-10-CM | POA: Diagnosis not present

## 2017-01-10 DIAGNOSIS — I10 Essential (primary) hypertension: Secondary | ICD-10-CM | POA: Diagnosis not present

## 2017-01-10 DIAGNOSIS — E1121 Type 2 diabetes mellitus with diabetic nephropathy: Secondary | ICD-10-CM | POA: Diagnosis not present

## 2017-01-10 DIAGNOSIS — E119 Type 2 diabetes mellitus without complications: Secondary | ICD-10-CM | POA: Diagnosis not present

## 2017-01-10 DIAGNOSIS — E034 Atrophy of thyroid (acquired): Secondary | ICD-10-CM | POA: Diagnosis not present

## 2017-02-18 DIAGNOSIS — D485 Neoplasm of uncertain behavior of skin: Secondary | ICD-10-CM | POA: Diagnosis not present

## 2017-04-10 DIAGNOSIS — L821 Other seborrheic keratosis: Secondary | ICD-10-CM | POA: Diagnosis not present

## 2017-04-11 DIAGNOSIS — E782 Mixed hyperlipidemia: Secondary | ICD-10-CM | POA: Diagnosis not present

## 2017-04-11 DIAGNOSIS — E034 Atrophy of thyroid (acquired): Secondary | ICD-10-CM | POA: Diagnosis not present

## 2017-04-11 DIAGNOSIS — I10 Essential (primary) hypertension: Secondary | ICD-10-CM | POA: Diagnosis not present

## 2017-04-11 DIAGNOSIS — G5603 Carpal tunnel syndrome, bilateral upper limbs: Secondary | ICD-10-CM | POA: Diagnosis not present

## 2017-04-11 DIAGNOSIS — E119 Type 2 diabetes mellitus without complications: Secondary | ICD-10-CM | POA: Diagnosis not present

## 2017-04-11 DIAGNOSIS — E1121 Type 2 diabetes mellitus with diabetic nephropathy: Secondary | ICD-10-CM | POA: Diagnosis not present

## 2017-04-11 DIAGNOSIS — I119 Hypertensive heart disease without heart failure: Secondary | ICD-10-CM | POA: Diagnosis not present

## 2017-05-09 ENCOUNTER — Ambulatory Visit: Payer: Medicare Other | Admitting: Cardiology

## 2017-05-16 NOTE — Progress Notes (Signed)
HPI The patient presents for evaulation of known coronary disease.  Echo last year demonstrated some aortic sclerosis. At a previous visit I stopped her metoprolol secondary to fatigue.      In April she had a CVA.  She had multifocal areas of left hemisphere infarction affecting the basal ganglia, deep white matter, and thalamus. MRI cervical spine showed moderate to severe stenosis at C5-6. The patient did not receive TPA. MRI of the head demonstrated  75% stenosis of the dominant left vertebral artery. No anterior circulation disease affecting the left ICA or left MCA. Echocardiogram with ejection fraction of 78% grade 1 diastolic dysfunction. Carotid Doppler no ICA stenosis. Neurology consulted placed on aspirin 325 mg daily.  However, she had a GI bleed and was hospitalized at [facility] and had to have her ASA stopped for awhile.  She is back on 81 mg.  She had an event monitor without any evidence of atrial fib and with only one run of NSVT.    Since I last saw her has done well.  She still has right leg weakness.  The patient denies any new symptoms such as chest discomfort, neck or arm discomfort. There has been no new shortness of breath, PND or orthopnea. There have been no reported palpitations, presyncope or syncope.    Allergies  Allergen Reactions  . Atorvastatin Other (See Comments)    Muscle pain  . Statins Other (See Comments)    MUSCLE PAIN  . Penicillins Hives    CHILDHOOD ALLERGY     Current Outpatient Prescriptions  Medication Sig Dispense Refill  . aspirin EC 81 MG tablet Take 81 mg by mouth daily.    . fish oil-omega-3 fatty acids 1000 MG capsule Take 1 g by mouth daily.     . furosemide (LASIX) 20 MG tablet Take 20 mg by mouth 2 (two) times daily as needed for fluid.     Marland Kitchen levothyroxine (SYNTHROID, LEVOTHROID) 50 MCG tablet Take 1 tablet by mouth daily.    Marland Kitchen lisinopril (PRINIVIL,ZESTRIL) 20 MG tablet Take 20 mg by mouth daily.    Marland Kitchen LORazepam (ATIVAN) 1 MG tablet  Take 1 tablet by mouth at bedtime as needed.    . pantoprazole (PROTONIX) 40 MG tablet Take 1 tablet by mouth daily.    . sertraline (ZOLOFT) 100 MG tablet Take 100 mg by mouth at bedtime.    . timolol (TIMOPTIC) 0.5 % ophthalmic solution Place 1 drop into both eyes every morning.  1   No current facility-administered medications for this visit.     Past Medical History:  Diagnosis Date  . Anemia    hx  . Arthritis   . CAD (coronary artery disease)   . Cardiomyopathy, ischemic    EF 40% at the time cath  . Depression   . Diabetes mellitus without complication (HCC)    borderline no meds  . Dyslipidemia   . GERD (gastroesophageal reflux disease)   . HTN (hypertension)   . Myocardial infarction (HCC)    mild  . Stroke ([place])   . Urinary frequency     Past Surgical History:  Procedure Laterality Date  . APPENDECTOMY    . CESAREAN SECTION     x2  . CORONARY ARTERY BYPASS GRAFT     LIMA to the LAD, SVG to diagonal, SVG to obtuse marginal, SVG to PDA..  2008  . LUMBAR LAMINECTOMY/DECOMPRESSION MICRODISCECTOMY N/A 05/01/2013   Procedure: LUMBAR LAMINECTOMY/DECOMPRESSION MICRODISCECTOMY 2 LEVELS;  Surgeon: Winfield Cunas,  MD;  Location: [place] NEURO ORS;  Service: Neurosurgery;  Laterality: N/A;  Lumbar Three-Four, Four-Five Laminectomies   . LUMBAR LAMINECTOMY/DECOMPRESSION MICRODISCECTOMY N/A 07/14/2014   Procedure: LUMBAR LAMINECTOMY/DECOMPRESSION MICRODISCECTOMY 1 LEVEL LUMBAR TWO-THREE;  Surgeon: Ashok Pall, MD;  Location: [place] NEUR[facility] ORS;  Service: Neurosurgery;  Laterality: N/A;  . SHOULDER ARTHROSCOPY W/ ROTATOR CUFF REPAIR Right    04  . TONSILLECTOMY      ROS:    As stated in the HPI and negative for all other systems.  PHYSICAL EXAM BP (!) 130/54   Pulse 72   Ht 5' 4.5" (1.638 m)   Wt 153 lb (69.4 kg)   BMI 25.86 kg/m   GENERAL:  Well appearing NECK:  No jugular venous distention, waveform within normal limits, carotid upstroke brisk and symmetric, soft bilateral  bruits, no thyromegaly LUNGS:  Clear to auscultation bilaterally CHEST:  Unremarkable HEART:  PMI not displaced or sustained,S1 and S2 within normal limits, no S3, no S4, no clicks, no rubs, no murmurs ABD:  Flat, positive bowel sounds normal in frequency in pitch, no bruits, no rebound, no guarding, no midline pulsatile mass, no hepatomegaly, no splenomegaly EXT:  2 plus pulses throughout, no edema, no cyanosis no clubbing   EKG:  NA  ASSESSMENT AND PLAN   CAD -  The patient has no new sypmtoms.  No further cardiovascular testing is indicated.  We will continue with aggressive risk reduction and meds as listed.  HYPERTENSION -    Blood pressure is well controlled.  No change in therapy.   HYPERLIPIDEMIA -  She is intolerant of statins.   No change in therapy.   CAROTID STENOSIS-   40-59% bilateral ICA stenosis which will be followed again in  Oct 2019.    CVA -  Continue low dose ASA.  No evidence of recurrent GI bleeding.

## 2017-05-17 ENCOUNTER — Ambulatory Visit (INDEPENDENT_AMBULATORY_CARE_PROVIDER_SITE_OTHER): Payer: Medicare Other | Admitting: Cardiology

## 2017-05-17 ENCOUNTER — Encounter: Payer: Self-pay | Admitting: Cardiology

## 2017-05-17 VITALS — BP 130/54 | HR 72 | Ht 64.5 in | Wt 153.0 lb

## 2017-05-17 DIAGNOSIS — E785 Hyperlipidemia, unspecified: Secondary | ICD-10-CM | POA: Diagnosis not present

## 2017-05-17 DIAGNOSIS — I739 Peripheral vascular disease, unspecified: Principal | ICD-10-CM

## 2017-05-17 DIAGNOSIS — I779 Disorder of arteries and arterioles, unspecified: Secondary | ICD-10-CM

## 2017-05-17 DIAGNOSIS — I2510 Atherosclerotic heart disease of native coronary artery without angina pectoris: Secondary | ICD-10-CM

## 2017-05-17 NOTE — Patient Instructions (Signed)
Medication Instructions:  Continue current medications  If you need a refill on your cardiac medications before your next appointment, please call your pharmacy.  Labwork: None Ordered   Testing/Procedures: Your physician has requested that you have a carotid duplex in October 2019. This test is an ultrasound of the carotid arteries in your neck. It looks at blood flow through these arteries that supply the brain with blood. Allow one hour for this exam. There are no restrictions or special instructions.    Follow-Up: Your physician wants you to follow-up in: 1 Year. You should receive a reminder letter in the mail two months in advance. If you do not receive a letter, please call our office [phone].   Thank you for choosing [facility] at [facility]!!

## 2017-07-24 DIAGNOSIS — I10 Essential (primary) hypertension: Secondary | ICD-10-CM | POA: Diagnosis not present

## 2017-07-24 DIAGNOSIS — E1121 Type 2 diabetes mellitus with diabetic nephropathy: Secondary | ICD-10-CM | POA: Diagnosis not present

## 2017-07-24 DIAGNOSIS — E034 Atrophy of thyroid (acquired): Secondary | ICD-10-CM | POA: Diagnosis not present

## 2017-07-24 DIAGNOSIS — G5603 Carpal tunnel syndrome, bilateral upper limbs: Secondary | ICD-10-CM | POA: Diagnosis not present

## 2017-07-24 DIAGNOSIS — E782 Mixed hyperlipidemia: Secondary | ICD-10-CM | POA: Diagnosis not present

## 2017-07-24 DIAGNOSIS — R202 Paresthesia of skin: Secondary | ICD-10-CM | POA: Diagnosis not present

## 2017-07-26 DIAGNOSIS — I252 Old myocardial infarction: Secondary | ICD-10-CM | POA: Diagnosis not present

## 2017-07-26 DIAGNOSIS — I2510 Atherosclerotic heart disease of native coronary artery without angina pectoris: Secondary | ICD-10-CM | POA: Diagnosis not present

## 2017-07-26 DIAGNOSIS — K921 Melena: Secondary | ICD-10-CM | POA: Diagnosis not present

## 2017-07-26 DIAGNOSIS — Z880 Allergy status to penicillin: Secondary | ICD-10-CM | POA: Diagnosis not present

## 2017-07-26 DIAGNOSIS — D62 Acute posthemorrhagic anemia: Secondary | ICD-10-CM | POA: Diagnosis not present

## 2017-07-26 DIAGNOSIS — M1990 Unspecified osteoarthritis, unspecified site: Secondary | ICD-10-CM | POA: Diagnosis not present

## 2017-07-26 DIAGNOSIS — K219 Gastro-esophageal reflux disease without esophagitis: Secondary | ICD-10-CM | POA: Diagnosis not present

## 2017-07-26 DIAGNOSIS — I69951 Hemiplegia and hemiparesis following unspecified cerebrovascular disease affecting right dominant side: Secondary | ICD-10-CM | POA: Diagnosis not present

## 2017-07-26 DIAGNOSIS — E860 Dehydration: Secondary | ICD-10-CM | POA: Diagnosis not present

## 2017-07-26 DIAGNOSIS — Z955 Presence of coronary angioplasty implant and graft: Secondary | ICD-10-CM | POA: Diagnosis not present

## 2017-07-26 DIAGNOSIS — Z8711 Personal history of peptic ulcer disease: Secondary | ICD-10-CM | POA: Diagnosis not present

## 2017-07-26 DIAGNOSIS — K922 Gastrointestinal hemorrhage, unspecified: Secondary | ICD-10-CM | POA: Diagnosis not present

## 2017-07-26 DIAGNOSIS — K449 Diaphragmatic hernia without obstruction or gangrene: Secondary | ICD-10-CM | POA: Diagnosis not present

## 2017-07-26 DIAGNOSIS — E785 Hyperlipidemia, unspecified: Secondary | ICD-10-CM | POA: Diagnosis not present

## 2017-07-26 DIAGNOSIS — Z79899 Other long term (current) drug therapy: Secondary | ICD-10-CM | POA: Diagnosis not present

## 2017-07-26 DIAGNOSIS — D509 Iron deficiency anemia, unspecified: Secondary | ICD-10-CM | POA: Diagnosis not present

## 2017-07-26 DIAGNOSIS — Z888 Allergy status to other drugs, medicaments and biological substances status: Secondary | ICD-10-CM | POA: Diagnosis not present

## 2017-07-26 DIAGNOSIS — R531 Weakness: Secondary | ICD-10-CM | POA: Diagnosis not present

## 2017-07-26 DIAGNOSIS — Z7982 Long term (current) use of aspirin: Secondary | ICD-10-CM | POA: Diagnosis not present

## 2017-07-26 DIAGNOSIS — R109 Unspecified abdominal pain: Secondary | ICD-10-CM | POA: Diagnosis not present

## 2017-07-26 DIAGNOSIS — E871 Hypo-osmolality and hyponatremia: Secondary | ICD-10-CM | POA: Diagnosis not present

## 2017-07-26 DIAGNOSIS — E039 Hypothyroidism, unspecified: Secondary | ICD-10-CM | POA: Diagnosis not present

## 2017-07-26 DIAGNOSIS — M810 Age-related osteoporosis without current pathological fracture: Secondary | ICD-10-CM | POA: Diagnosis not present

## 2017-07-29 DIAGNOSIS — Z8711 Personal history of peptic ulcer disease: Secondary | ICD-10-CM | POA: Diagnosis not present

## 2017-07-29 DIAGNOSIS — I2510 Atherosclerotic heart disease of native coronary artery without angina pectoris: Secondary | ICD-10-CM | POA: Diagnosis not present

## 2017-07-29 DIAGNOSIS — M810 Age-related osteoporosis without current pathological fracture: Secondary | ICD-10-CM | POA: Diagnosis not present

## 2017-07-29 DIAGNOSIS — E039 Hypothyroidism, unspecified: Secondary | ICD-10-CM | POA: Diagnosis not present

## 2017-07-29 DIAGNOSIS — Z955 Presence of coronary angioplasty implant and graft: Secondary | ICD-10-CM | POA: Diagnosis not present

## 2017-07-29 DIAGNOSIS — Z7982 Long term (current) use of aspirin: Secondary | ICD-10-CM | POA: Diagnosis not present

## 2017-07-29 DIAGNOSIS — Z79899 Other long term (current) drug therapy: Secondary | ICD-10-CM | POA: Diagnosis not present

## 2017-07-29 DIAGNOSIS — M1990 Unspecified osteoarthritis, unspecified site: Secondary | ICD-10-CM | POA: Diagnosis not present

## 2017-07-29 DIAGNOSIS — R531 Weakness: Secondary | ICD-10-CM | POA: Diagnosis not present

## 2017-07-29 DIAGNOSIS — K921 Melena: Secondary | ICD-10-CM | POA: Diagnosis not present

## 2017-07-29 DIAGNOSIS — D509 Iron deficiency anemia, unspecified: Secondary | ICD-10-CM | POA: Diagnosis not present

## 2017-07-29 DIAGNOSIS — Z880 Allergy status to penicillin: Secondary | ICD-10-CM | POA: Diagnosis not present

## 2017-07-29 DIAGNOSIS — I252 Old myocardial infarction: Secondary | ICD-10-CM | POA: Diagnosis not present

## 2017-07-29 DIAGNOSIS — K219 Gastro-esophageal reflux disease without esophagitis: Secondary | ICD-10-CM | POA: Diagnosis not present

## 2017-07-29 DIAGNOSIS — E785 Hyperlipidemia, unspecified: Secondary | ICD-10-CM | POA: Diagnosis not present

## 2017-07-29 DIAGNOSIS — E871 Hypo-osmolality and hyponatremia: Secondary | ICD-10-CM | POA: Diagnosis not present

## 2017-07-29 DIAGNOSIS — K449 Diaphragmatic hernia without obstruction or gangrene: Secondary | ICD-10-CM | POA: Diagnosis not present

## 2017-07-29 DIAGNOSIS — E860 Dehydration: Secondary | ICD-10-CM | POA: Diagnosis not present

## 2017-07-29 DIAGNOSIS — I69951 Hemiplegia and hemiparesis following unspecified cerebrovascular disease affecting right dominant side: Secondary | ICD-10-CM | POA: Diagnosis not present

## 2017-07-29 DIAGNOSIS — Z888 Allergy status to other drugs, medicaments and biological substances status: Secondary | ICD-10-CM | POA: Diagnosis not present

## 2017-07-31 DIAGNOSIS — I10 Essential (primary) hypertension: Secondary | ICD-10-CM | POA: Diagnosis not present

## 2017-07-31 DIAGNOSIS — K449 Diaphragmatic hernia without obstruction or gangrene: Secondary | ICD-10-CM | POA: Diagnosis not present

## 2017-07-31 DIAGNOSIS — M810 Age-related osteoporosis without current pathological fracture: Secondary | ICD-10-CM | POA: Diagnosis not present

## 2017-07-31 DIAGNOSIS — Z9181 History of falling: Secondary | ICD-10-CM | POA: Diagnosis not present

## 2017-07-31 DIAGNOSIS — Z951 Presence of aortocoronary bypass graft: Secondary | ICD-10-CM | POA: Diagnosis not present

## 2017-07-31 DIAGNOSIS — Z602 Problems related to living alone: Secondary | ICD-10-CM | POA: Diagnosis not present

## 2017-07-31 DIAGNOSIS — E119 Type 2 diabetes mellitus without complications: Secondary | ICD-10-CM | POA: Diagnosis not present

## 2017-07-31 DIAGNOSIS — K279 Peptic ulcer, site unspecified, unspecified as acute or chronic, without hemorrhage or perforation: Secondary | ICD-10-CM | POA: Diagnosis not present

## 2017-07-31 DIAGNOSIS — D500 Iron deficiency anemia secondary to blood loss (chronic): Secondary | ICD-10-CM | POA: Diagnosis not present

## 2017-07-31 DIAGNOSIS — J449 Chronic obstructive pulmonary disease, unspecified: Secondary | ICD-10-CM | POA: Diagnosis not present

## 2017-07-31 DIAGNOSIS — Z7982 Long term (current) use of aspirin: Secondary | ICD-10-CM | POA: Diagnosis not present

## 2017-07-31 DIAGNOSIS — Z79891 Long term (current) use of opiate analgesic: Secondary | ICD-10-CM | POA: Diagnosis not present

## 2017-07-31 DIAGNOSIS — E039 Hypothyroidism, unspecified: Secondary | ICD-10-CM | POA: Diagnosis not present

## 2017-07-31 DIAGNOSIS — I2510 Atherosclerotic heart disease of native coronary artery without angina pectoris: Secondary | ICD-10-CM | POA: Diagnosis not present

## 2017-07-31 DIAGNOSIS — M6281 Muscle weakness (generalized): Secondary | ICD-10-CM | POA: Diagnosis not present

## 2017-07-31 DIAGNOSIS — K5730 Diverticulosis of large intestine without perforation or abscess without bleeding: Secondary | ICD-10-CM | POA: Diagnosis not present

## 2017-08-02 DIAGNOSIS — K449 Diaphragmatic hernia without obstruction or gangrene: Secondary | ICD-10-CM | POA: Diagnosis not present

## 2017-08-02 DIAGNOSIS — K5730 Diverticulosis of large intestine without perforation or abscess without bleeding: Secondary | ICD-10-CM | POA: Diagnosis not present

## 2017-08-02 DIAGNOSIS — D500 Iron deficiency anemia secondary to blood loss (chronic): Secondary | ICD-10-CM | POA: Diagnosis not present

## 2017-08-02 DIAGNOSIS — Z9181 History of falling: Secondary | ICD-10-CM | POA: Diagnosis not present

## 2017-08-02 DIAGNOSIS — I10 Essential (primary) hypertension: Secondary | ICD-10-CM | POA: Diagnosis not present

## 2017-08-02 DIAGNOSIS — Z602 Problems related to living alone: Secondary | ICD-10-CM | POA: Diagnosis not present

## 2017-08-02 DIAGNOSIS — K279 Peptic ulcer, site unspecified, unspecified as acute or chronic, without hemorrhage or perforation: Secondary | ICD-10-CM | POA: Diagnosis not present

## 2017-08-02 DIAGNOSIS — Z7982 Long term (current) use of aspirin: Secondary | ICD-10-CM | POA: Diagnosis not present

## 2017-08-02 DIAGNOSIS — J449 Chronic obstructive pulmonary disease, unspecified: Secondary | ICD-10-CM | POA: Diagnosis not present

## 2017-08-02 DIAGNOSIS — M6281 Muscle weakness (generalized): Secondary | ICD-10-CM | POA: Diagnosis not present

## 2017-08-02 DIAGNOSIS — Z951 Presence of aortocoronary bypass graft: Secondary | ICD-10-CM | POA: Diagnosis not present

## 2017-08-02 DIAGNOSIS — E119 Type 2 diabetes mellitus without complications: Secondary | ICD-10-CM | POA: Diagnosis not present

## 2017-08-02 DIAGNOSIS — M810 Age-related osteoporosis without current pathological fracture: Secondary | ICD-10-CM | POA: Diagnosis not present

## 2017-08-02 DIAGNOSIS — Z79891 Long term (current) use of opiate analgesic: Secondary | ICD-10-CM | POA: Diagnosis not present

## 2017-08-02 DIAGNOSIS — I2510 Atherosclerotic heart disease of native coronary artery without angina pectoris: Secondary | ICD-10-CM | POA: Diagnosis not present

## 2017-08-02 DIAGNOSIS — E039 Hypothyroidism, unspecified: Secondary | ICD-10-CM | POA: Diagnosis not present

## 2017-08-07 DIAGNOSIS — K449 Diaphragmatic hernia without obstruction or gangrene: Secondary | ICD-10-CM | POA: Diagnosis not present

## 2017-08-07 DIAGNOSIS — D500 Iron deficiency anemia secondary to blood loss (chronic): Secondary | ICD-10-CM | POA: Diagnosis not present

## 2017-08-07 DIAGNOSIS — M6281 Muscle weakness (generalized): Secondary | ICD-10-CM | POA: Diagnosis not present

## 2017-08-07 DIAGNOSIS — Z602 Problems related to living alone: Secondary | ICD-10-CM | POA: Diagnosis not present

## 2017-08-07 DIAGNOSIS — I2510 Atherosclerotic heart disease of native coronary artery without angina pectoris: Secondary | ICD-10-CM | POA: Diagnosis not present

## 2017-08-07 DIAGNOSIS — Z79891 Long term (current) use of opiate analgesic: Secondary | ICD-10-CM | POA: Diagnosis not present

## 2017-08-07 DIAGNOSIS — E039 Hypothyroidism, unspecified: Secondary | ICD-10-CM | POA: Diagnosis not present

## 2017-08-07 DIAGNOSIS — Z9181 History of falling: Secondary | ICD-10-CM | POA: Diagnosis not present

## 2017-08-07 DIAGNOSIS — Z7982 Long term (current) use of aspirin: Secondary | ICD-10-CM | POA: Diagnosis not present

## 2017-08-07 DIAGNOSIS — Z951 Presence of aortocoronary bypass graft: Secondary | ICD-10-CM | POA: Diagnosis not present

## 2017-08-07 DIAGNOSIS — M810 Age-related osteoporosis without current pathological fracture: Secondary | ICD-10-CM | POA: Diagnosis not present

## 2017-08-07 DIAGNOSIS — J449 Chronic obstructive pulmonary disease, unspecified: Secondary | ICD-10-CM | POA: Diagnosis not present

## 2017-08-07 DIAGNOSIS — I10 Essential (primary) hypertension: Secondary | ICD-10-CM | POA: Diagnosis not present

## 2017-08-07 DIAGNOSIS — K5730 Diverticulosis of large intestine without perforation or abscess without bleeding: Secondary | ICD-10-CM | POA: Diagnosis not present

## 2017-08-07 DIAGNOSIS — K279 Peptic ulcer, site unspecified, unspecified as acute or chronic, without hemorrhage or perforation: Secondary | ICD-10-CM | POA: Diagnosis not present

## 2017-08-07 DIAGNOSIS — E119 Type 2 diabetes mellitus without complications: Secondary | ICD-10-CM | POA: Diagnosis not present

## 2017-08-08 DIAGNOSIS — D508 Other iron deficiency anemias: Secondary | ICD-10-CM | POA: Diagnosis not present

## 2017-08-08 DIAGNOSIS — E119 Type 2 diabetes mellitus without complications: Secondary | ICD-10-CM | POA: Diagnosis not present

## 2017-08-08 DIAGNOSIS — D500 Iron deficiency anemia secondary to blood loss (chronic): Secondary | ICD-10-CM | POA: Diagnosis not present

## 2017-08-09 DIAGNOSIS — E119 Type 2 diabetes mellitus without complications: Secondary | ICD-10-CM | POA: Diagnosis not present

## 2017-08-09 DIAGNOSIS — D500 Iron deficiency anemia secondary to blood loss (chronic): Secondary | ICD-10-CM | POA: Diagnosis not present

## 2017-08-09 DIAGNOSIS — M810 Age-related osteoporosis without current pathological fracture: Secondary | ICD-10-CM | POA: Diagnosis not present

## 2017-08-09 DIAGNOSIS — M6281 Muscle weakness (generalized): Secondary | ICD-10-CM | POA: Diagnosis not present

## 2017-08-09 DIAGNOSIS — J449 Chronic obstructive pulmonary disease, unspecified: Secondary | ICD-10-CM | POA: Diagnosis not present

## 2017-08-09 DIAGNOSIS — K279 Peptic ulcer, site unspecified, unspecified as acute or chronic, without hemorrhage or perforation: Secondary | ICD-10-CM | POA: Diagnosis not present

## 2017-08-09 DIAGNOSIS — E039 Hypothyroidism, unspecified: Secondary | ICD-10-CM | POA: Diagnosis not present

## 2017-08-09 DIAGNOSIS — Z9181 History of falling: Secondary | ICD-10-CM | POA: Diagnosis not present

## 2017-08-09 DIAGNOSIS — Z79891 Long term (current) use of opiate analgesic: Secondary | ICD-10-CM | POA: Diagnosis not present

## 2017-08-09 DIAGNOSIS — Z7982 Long term (current) use of aspirin: Secondary | ICD-10-CM | POA: Diagnosis not present

## 2017-08-09 DIAGNOSIS — Z951 Presence of aortocoronary bypass graft: Secondary | ICD-10-CM | POA: Diagnosis not present

## 2017-08-09 DIAGNOSIS — K5730 Diverticulosis of large intestine without perforation or abscess without bleeding: Secondary | ICD-10-CM | POA: Diagnosis not present

## 2017-08-09 DIAGNOSIS — I2510 Atherosclerotic heart disease of native coronary artery without angina pectoris: Secondary | ICD-10-CM | POA: Diagnosis not present

## 2017-08-09 DIAGNOSIS — Z602 Problems related to living alone: Secondary | ICD-10-CM | POA: Diagnosis not present

## 2017-08-09 DIAGNOSIS — K449 Diaphragmatic hernia without obstruction or gangrene: Secondary | ICD-10-CM | POA: Diagnosis not present

## 2017-08-09 DIAGNOSIS — I10 Essential (primary) hypertension: Secondary | ICD-10-CM | POA: Diagnosis not present

## 2017-08-12 DIAGNOSIS — K5730 Diverticulosis of large intestine without perforation or abscess without bleeding: Secondary | ICD-10-CM | POA: Diagnosis not present

## 2017-08-12 DIAGNOSIS — Z7982 Long term (current) use of aspirin: Secondary | ICD-10-CM | POA: Diagnosis not present

## 2017-08-12 DIAGNOSIS — I10 Essential (primary) hypertension: Secondary | ICD-10-CM | POA: Diagnosis not present

## 2017-08-12 DIAGNOSIS — K449 Diaphragmatic hernia without obstruction or gangrene: Secondary | ICD-10-CM | POA: Diagnosis not present

## 2017-08-12 DIAGNOSIS — E119 Type 2 diabetes mellitus without complications: Secondary | ICD-10-CM | POA: Diagnosis not present

## 2017-08-12 DIAGNOSIS — Z9181 History of falling: Secondary | ICD-10-CM | POA: Diagnosis not present

## 2017-08-12 DIAGNOSIS — K279 Peptic ulcer, site unspecified, unspecified as acute or chronic, without hemorrhage or perforation: Secondary | ICD-10-CM | POA: Diagnosis not present

## 2017-08-12 DIAGNOSIS — M6281 Muscle weakness (generalized): Secondary | ICD-10-CM | POA: Diagnosis not present

## 2017-08-12 DIAGNOSIS — J449 Chronic obstructive pulmonary disease, unspecified: Secondary | ICD-10-CM | POA: Diagnosis not present

## 2017-08-12 DIAGNOSIS — D500 Iron deficiency anemia secondary to blood loss (chronic): Secondary | ICD-10-CM | POA: Diagnosis not present

## 2017-08-12 DIAGNOSIS — Z602 Problems related to living alone: Secondary | ICD-10-CM | POA: Diagnosis not present

## 2017-08-12 DIAGNOSIS — I2510 Atherosclerotic heart disease of native coronary artery without angina pectoris: Secondary | ICD-10-CM | POA: Diagnosis not present

## 2017-08-12 DIAGNOSIS — E039 Hypothyroidism, unspecified: Secondary | ICD-10-CM | POA: Diagnosis not present

## 2017-08-12 DIAGNOSIS — Z79891 Long term (current) use of opiate analgesic: Secondary | ICD-10-CM | POA: Diagnosis not present

## 2017-08-12 DIAGNOSIS — Z951 Presence of aortocoronary bypass graft: Secondary | ICD-10-CM | POA: Diagnosis not present

## 2017-08-12 DIAGNOSIS — M810 Age-related osteoporosis without current pathological fracture: Secondary | ICD-10-CM | POA: Diagnosis not present

## 2017-08-13 DIAGNOSIS — E119 Type 2 diabetes mellitus without complications: Secondary | ICD-10-CM | POA: Diagnosis not present

## 2017-08-13 DIAGNOSIS — Z9181 History of falling: Secondary | ICD-10-CM | POA: Diagnosis not present

## 2017-08-13 DIAGNOSIS — I2510 Atherosclerotic heart disease of native coronary artery without angina pectoris: Secondary | ICD-10-CM | POA: Diagnosis not present

## 2017-08-13 DIAGNOSIS — M810 Age-related osteoporosis without current pathological fracture: Secondary | ICD-10-CM | POA: Diagnosis not present

## 2017-08-13 DIAGNOSIS — Z602 Problems related to living alone: Secondary | ICD-10-CM | POA: Diagnosis not present

## 2017-08-13 DIAGNOSIS — D500 Iron deficiency anemia secondary to blood loss (chronic): Secondary | ICD-10-CM | POA: Diagnosis not present

## 2017-08-13 DIAGNOSIS — Z951 Presence of aortocoronary bypass graft: Secondary | ICD-10-CM | POA: Diagnosis not present

## 2017-08-13 DIAGNOSIS — Z79891 Long term (current) use of opiate analgesic: Secondary | ICD-10-CM | POA: Diagnosis not present

## 2017-08-13 DIAGNOSIS — I10 Essential (primary) hypertension: Secondary | ICD-10-CM | POA: Diagnosis not present

## 2017-08-13 DIAGNOSIS — K449 Diaphragmatic hernia without obstruction or gangrene: Secondary | ICD-10-CM | POA: Diagnosis not present

## 2017-08-13 DIAGNOSIS — E039 Hypothyroidism, unspecified: Secondary | ICD-10-CM | POA: Diagnosis not present

## 2017-08-13 DIAGNOSIS — K279 Peptic ulcer, site unspecified, unspecified as acute or chronic, without hemorrhage or perforation: Secondary | ICD-10-CM | POA: Diagnosis not present

## 2017-08-13 DIAGNOSIS — K219 Gastro-esophageal reflux disease without esophagitis: Secondary | ICD-10-CM | POA: Diagnosis not present

## 2017-08-13 DIAGNOSIS — M6281 Muscle weakness (generalized): Secondary | ICD-10-CM | POA: Diagnosis not present

## 2017-08-13 DIAGNOSIS — J449 Chronic obstructive pulmonary disease, unspecified: Secondary | ICD-10-CM | POA: Diagnosis not present

## 2017-08-13 DIAGNOSIS — K5730 Diverticulosis of large intestine without perforation or abscess without bleeding: Secondary | ICD-10-CM | POA: Diagnosis not present

## 2017-08-13 DIAGNOSIS — Z7982 Long term (current) use of aspirin: Secondary | ICD-10-CM | POA: Diagnosis not present

## 2017-08-16 DIAGNOSIS — Z7982 Long term (current) use of aspirin: Secondary | ICD-10-CM | POA: Diagnosis not present

## 2017-08-16 DIAGNOSIS — M810 Age-related osteoporosis without current pathological fracture: Secondary | ICD-10-CM | POA: Diagnosis not present

## 2017-08-16 DIAGNOSIS — I2510 Atherosclerotic heart disease of native coronary artery without angina pectoris: Secondary | ICD-10-CM | POA: Diagnosis not present

## 2017-08-16 DIAGNOSIS — K449 Diaphragmatic hernia without obstruction or gangrene: Secondary | ICD-10-CM | POA: Diagnosis not present

## 2017-08-16 DIAGNOSIS — Z9181 History of falling: Secondary | ICD-10-CM | POA: Diagnosis not present

## 2017-08-16 DIAGNOSIS — E039 Hypothyroidism, unspecified: Secondary | ICD-10-CM | POA: Diagnosis not present

## 2017-08-16 DIAGNOSIS — M6281 Muscle weakness (generalized): Secondary | ICD-10-CM | POA: Diagnosis not present

## 2017-08-16 DIAGNOSIS — J449 Chronic obstructive pulmonary disease, unspecified: Secondary | ICD-10-CM | POA: Diagnosis not present

## 2017-08-16 DIAGNOSIS — Z79891 Long term (current) use of opiate analgesic: Secondary | ICD-10-CM | POA: Diagnosis not present

## 2017-08-16 DIAGNOSIS — Z602 Problems related to living alone: Secondary | ICD-10-CM | POA: Diagnosis not present

## 2017-08-16 DIAGNOSIS — K5730 Diverticulosis of large intestine without perforation or abscess without bleeding: Secondary | ICD-10-CM | POA: Diagnosis not present

## 2017-08-16 DIAGNOSIS — I10 Essential (primary) hypertension: Secondary | ICD-10-CM | POA: Diagnosis not present

## 2017-08-16 DIAGNOSIS — D500 Iron deficiency anemia secondary to blood loss (chronic): Secondary | ICD-10-CM | POA: Diagnosis not present

## 2017-08-16 DIAGNOSIS — K279 Peptic ulcer, site unspecified, unspecified as acute or chronic, without hemorrhage or perforation: Secondary | ICD-10-CM | POA: Diagnosis not present

## 2017-08-16 DIAGNOSIS — E119 Type 2 diabetes mellitus without complications: Secondary | ICD-10-CM | POA: Diagnosis not present

## 2017-08-16 DIAGNOSIS — Z951 Presence of aortocoronary bypass graft: Secondary | ICD-10-CM | POA: Diagnosis not present

## 2017-08-19 DIAGNOSIS — K279 Peptic ulcer, site unspecified, unspecified as acute or chronic, without hemorrhage or perforation: Secondary | ICD-10-CM | POA: Diagnosis not present

## 2017-08-19 DIAGNOSIS — I10 Essential (primary) hypertension: Secondary | ICD-10-CM | POA: Diagnosis not present

## 2017-08-19 DIAGNOSIS — Z79891 Long term (current) use of opiate analgesic: Secondary | ICD-10-CM | POA: Diagnosis not present

## 2017-08-19 DIAGNOSIS — J449 Chronic obstructive pulmonary disease, unspecified: Secondary | ICD-10-CM | POA: Diagnosis not present

## 2017-08-19 DIAGNOSIS — E119 Type 2 diabetes mellitus without complications: Secondary | ICD-10-CM | POA: Diagnosis not present

## 2017-08-19 DIAGNOSIS — K5730 Diverticulosis of large intestine without perforation or abscess without bleeding: Secondary | ICD-10-CM | POA: Diagnosis not present

## 2017-08-19 DIAGNOSIS — D500 Iron deficiency anemia secondary to blood loss (chronic): Secondary | ICD-10-CM | POA: Diagnosis not present

## 2017-08-19 DIAGNOSIS — K449 Diaphragmatic hernia without obstruction or gangrene: Secondary | ICD-10-CM | POA: Diagnosis not present

## 2017-08-19 DIAGNOSIS — I2510 Atherosclerotic heart disease of native coronary artery without angina pectoris: Secondary | ICD-10-CM | POA: Diagnosis not present

## 2017-08-19 DIAGNOSIS — Z951 Presence of aortocoronary bypass graft: Secondary | ICD-10-CM | POA: Diagnosis not present

## 2017-08-19 DIAGNOSIS — Z9181 History of falling: Secondary | ICD-10-CM | POA: Diagnosis not present

## 2017-08-19 DIAGNOSIS — M810 Age-related osteoporosis without current pathological fracture: Secondary | ICD-10-CM | POA: Diagnosis not present

## 2017-08-19 DIAGNOSIS — E039 Hypothyroidism, unspecified: Secondary | ICD-10-CM | POA: Diagnosis not present

## 2017-08-19 DIAGNOSIS — M6281 Muscle weakness (generalized): Secondary | ICD-10-CM | POA: Diagnosis not present

## 2017-08-19 DIAGNOSIS — Z7982 Long term (current) use of aspirin: Secondary | ICD-10-CM | POA: Diagnosis not present

## 2017-08-19 DIAGNOSIS — Z602 Problems related to living alone: Secondary | ICD-10-CM | POA: Diagnosis not present

## 2017-08-21 DIAGNOSIS — E039 Hypothyroidism, unspecified: Secondary | ICD-10-CM | POA: Diagnosis not present

## 2017-08-21 DIAGNOSIS — E119 Type 2 diabetes mellitus without complications: Secondary | ICD-10-CM | POA: Diagnosis not present

## 2017-08-21 DIAGNOSIS — Z79891 Long term (current) use of opiate analgesic: Secondary | ICD-10-CM | POA: Diagnosis not present

## 2017-08-21 DIAGNOSIS — M6281 Muscle weakness (generalized): Secondary | ICD-10-CM | POA: Diagnosis not present

## 2017-08-21 DIAGNOSIS — Z9181 History of falling: Secondary | ICD-10-CM | POA: Diagnosis not present

## 2017-08-21 DIAGNOSIS — Z7982 Long term (current) use of aspirin: Secondary | ICD-10-CM | POA: Diagnosis not present

## 2017-08-21 DIAGNOSIS — M810 Age-related osteoporosis without current pathological fracture: Secondary | ICD-10-CM | POA: Diagnosis not present

## 2017-08-21 DIAGNOSIS — J449 Chronic obstructive pulmonary disease, unspecified: Secondary | ICD-10-CM | POA: Diagnosis not present

## 2017-08-21 DIAGNOSIS — Z951 Presence of aortocoronary bypass graft: Secondary | ICD-10-CM | POA: Diagnosis not present

## 2017-08-21 DIAGNOSIS — I10 Essential (primary) hypertension: Secondary | ICD-10-CM | POA: Diagnosis not present

## 2017-08-21 DIAGNOSIS — I2510 Atherosclerotic heart disease of native coronary artery without angina pectoris: Secondary | ICD-10-CM | POA: Diagnosis not present

## 2017-08-21 DIAGNOSIS — K279 Peptic ulcer, site unspecified, unspecified as acute or chronic, without hemorrhage or perforation: Secondary | ICD-10-CM | POA: Diagnosis not present

## 2017-08-21 DIAGNOSIS — Z602 Problems related to living alone: Secondary | ICD-10-CM | POA: Diagnosis not present

## 2017-08-21 DIAGNOSIS — K5730 Diverticulosis of large intestine without perforation or abscess without bleeding: Secondary | ICD-10-CM | POA: Diagnosis not present

## 2017-08-21 DIAGNOSIS — K449 Diaphragmatic hernia without obstruction or gangrene: Secondary | ICD-10-CM | POA: Diagnosis not present

## 2017-08-21 DIAGNOSIS — D500 Iron deficiency anemia secondary to blood loss (chronic): Secondary | ICD-10-CM | POA: Diagnosis not present

## 2017-08-26 DIAGNOSIS — K5730 Diverticulosis of large intestine without perforation or abscess without bleeding: Secondary | ICD-10-CM | POA: Diagnosis not present

## 2017-08-26 DIAGNOSIS — I2510 Atherosclerotic heart disease of native coronary artery without angina pectoris: Secondary | ICD-10-CM | POA: Diagnosis not present

## 2017-08-26 DIAGNOSIS — M6281 Muscle weakness (generalized): Secondary | ICD-10-CM | POA: Diagnosis not present

## 2017-08-26 DIAGNOSIS — E039 Hypothyroidism, unspecified: Secondary | ICD-10-CM | POA: Diagnosis not present

## 2017-08-26 DIAGNOSIS — D500 Iron deficiency anemia secondary to blood loss (chronic): Secondary | ICD-10-CM | POA: Diagnosis not present

## 2017-08-26 DIAGNOSIS — J449 Chronic obstructive pulmonary disease, unspecified: Secondary | ICD-10-CM | POA: Diagnosis not present

## 2017-08-26 DIAGNOSIS — K449 Diaphragmatic hernia without obstruction or gangrene: Secondary | ICD-10-CM | POA: Diagnosis not present

## 2017-08-26 DIAGNOSIS — Z7982 Long term (current) use of aspirin: Secondary | ICD-10-CM | POA: Diagnosis not present

## 2017-08-26 DIAGNOSIS — Z79891 Long term (current) use of opiate analgesic: Secondary | ICD-10-CM | POA: Diagnosis not present

## 2017-08-26 DIAGNOSIS — Z602 Problems related to living alone: Secondary | ICD-10-CM | POA: Diagnosis not present

## 2017-08-26 DIAGNOSIS — K279 Peptic ulcer, site unspecified, unspecified as acute or chronic, without hemorrhage or perforation: Secondary | ICD-10-CM | POA: Diagnosis not present

## 2017-08-26 DIAGNOSIS — M810 Age-related osteoporosis without current pathological fracture: Secondary | ICD-10-CM | POA: Diagnosis not present

## 2017-08-26 DIAGNOSIS — I10 Essential (primary) hypertension: Secondary | ICD-10-CM | POA: Diagnosis not present

## 2017-08-26 DIAGNOSIS — E119 Type 2 diabetes mellitus without complications: Secondary | ICD-10-CM | POA: Diagnosis not present

## 2017-08-26 DIAGNOSIS — Z9181 History of falling: Secondary | ICD-10-CM | POA: Diagnosis not present

## 2017-08-26 DIAGNOSIS — Z951 Presence of aortocoronary bypass graft: Secondary | ICD-10-CM | POA: Diagnosis not present

## 2017-09-12 DIAGNOSIS — H40003 Preglaucoma, unspecified, bilateral: Secondary | ICD-10-CM | POA: Diagnosis not present

## 2017-09-12 DIAGNOSIS — H26499 Other secondary cataract, unspecified eye: Secondary | ICD-10-CM | POA: Diagnosis not present

## 2017-10-16 IMAGING — MR MR HEAD W/O CM
9 of 10 series · 35 of 48 positions shown · non-contrast
Comparison: MRI head December 09, 2015 and CT HEAD February 07, 2016 at
2785 hours

CLINICAL DATA: Dizziness beginning this morning, now improving.
One-week of neck and shoulder pain, dyspnea. Recent stroke. Evaluate
RIGHT-sided numbness. History of hypertension, dyslipidemia,
diabetes.

EXAM:
MRI HEAD WITHOUT CONTRAST
TECHNIQUE: Multiplanar, multiecho pulse sequences of the brain and surrounding
structures were obtained without intravenous contrast.

[Series 3: T1 · sagittal · 5.0mm · 0.47mm/px · 1 of 23 slices shown]
[im 1/23]
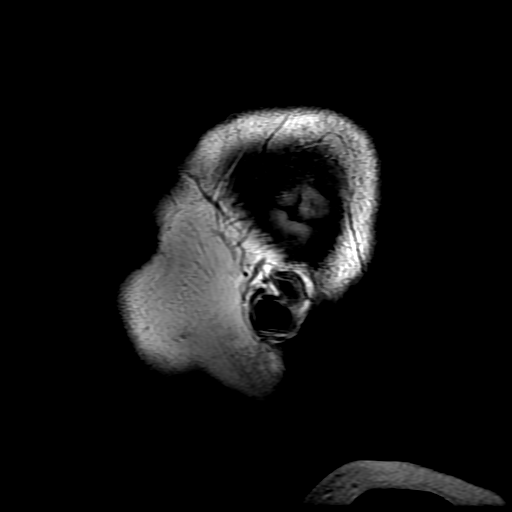

[Series 4: DWI · axial · 3.0mm · 1.09mm/px · z∈[-60,+76]mm · 8 of 94 slices shown (1 of 4)]
[im 1/94]
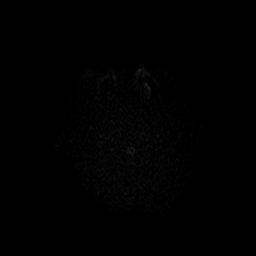
[im 11/94]
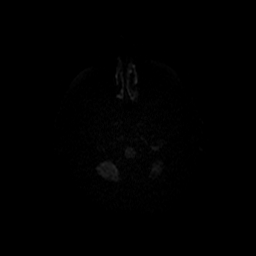
[im 32/94]
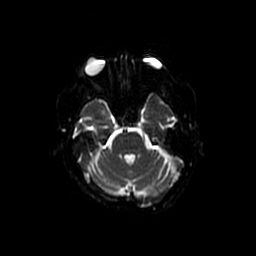
[im 42/94]
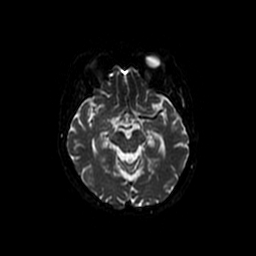
[im 52/94]
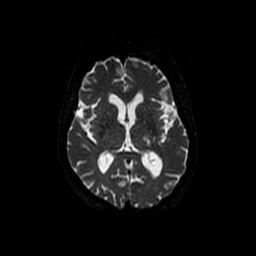
[im 63/94]
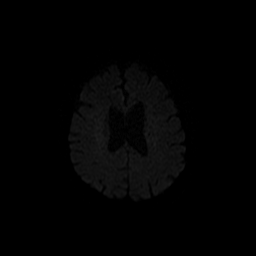
[im 83/94]
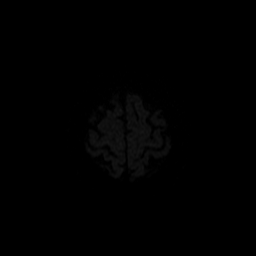
[im 94/94]
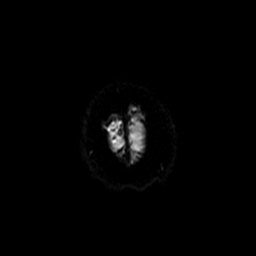

[Series 5: DWI · coronal · 5.0mm · 1.09mm/px · 7 of 66 slices shown (2 of 4)]
[im 1/66]
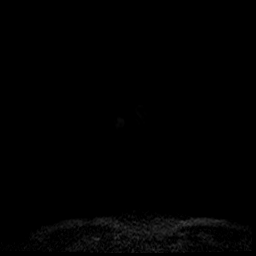
[im 11/66]
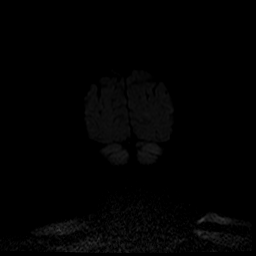
[im 22/66]
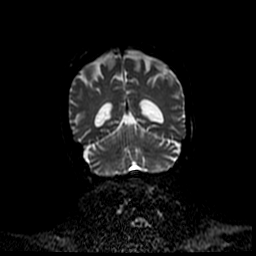
[im 33/66]
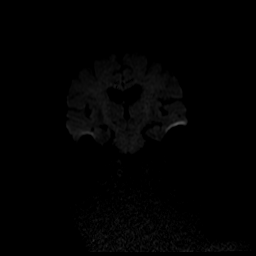
[im 44/66]
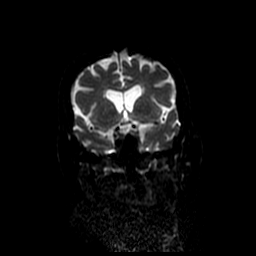
[im 55/66]
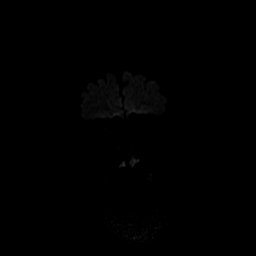
[im 66/66]
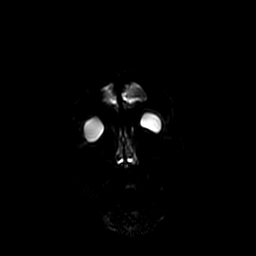

[Series 6: T2 · axial · 5.0mm · 0.43mm/px · z∈[-63,+73]mm · 3 of 24 slices shown (1 of 2)]
[im 1/24]
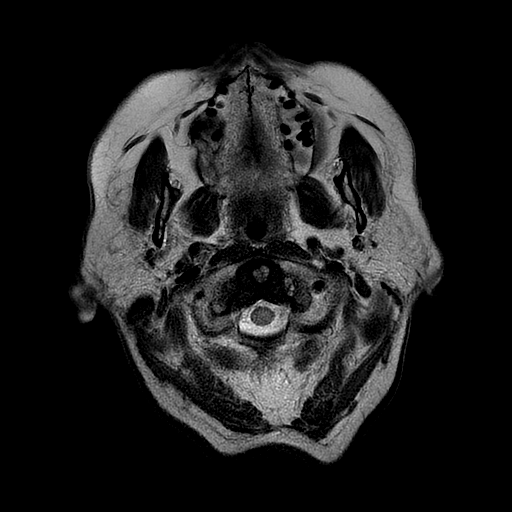
[im 12/24]
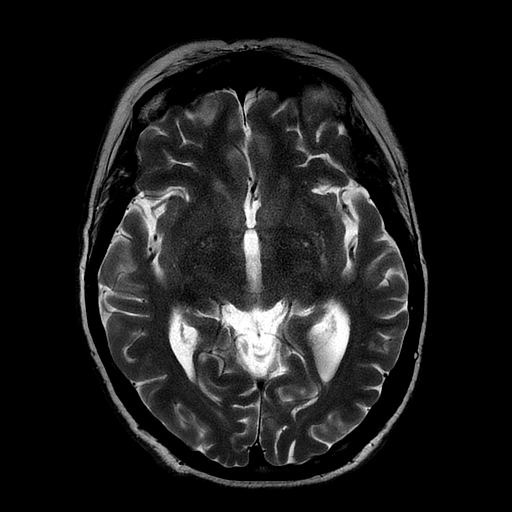
[im 24/24]
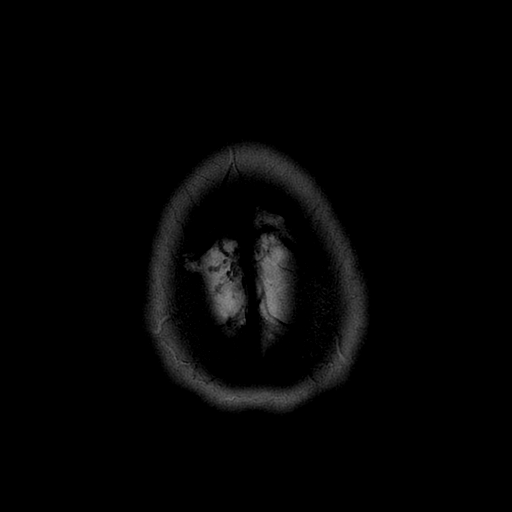

[Series 7: FLAIR · axial · 5.0mm · 0.43mm/px · z∈[-63,+73]mm · 3 of 24 slices shown]
[im 1/24]
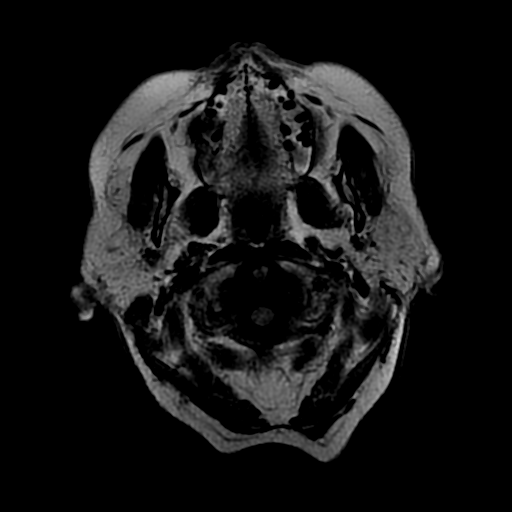
[im 12/24]
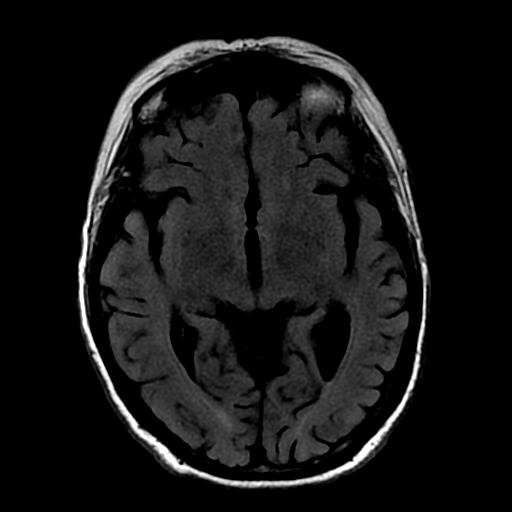
[im 24/24]
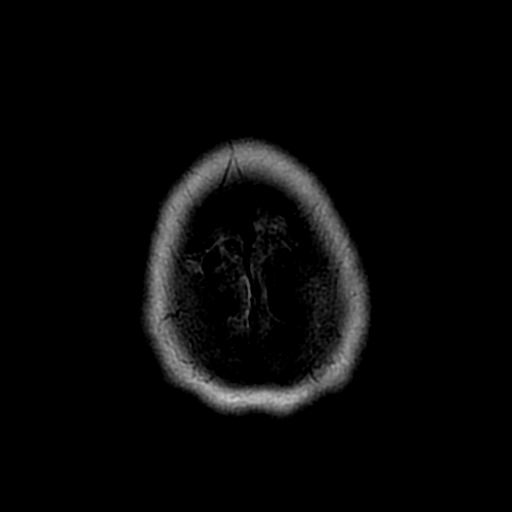

[Series 8: ax mpgr · axial · 5.0mm · 0.43mm/px · z∈[-63,+2]mm · 2 of 24 slices shown]
[im 1/24]
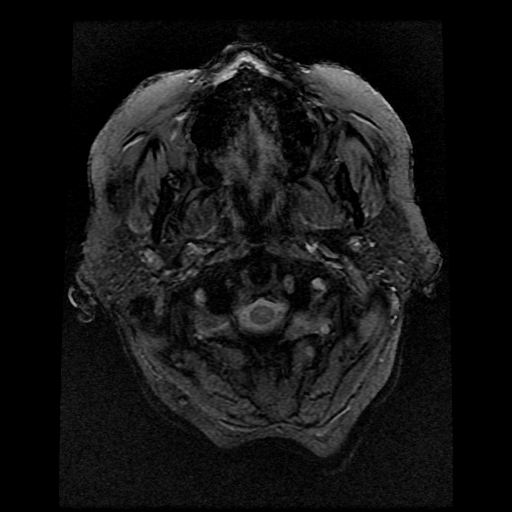
[im 12/24]
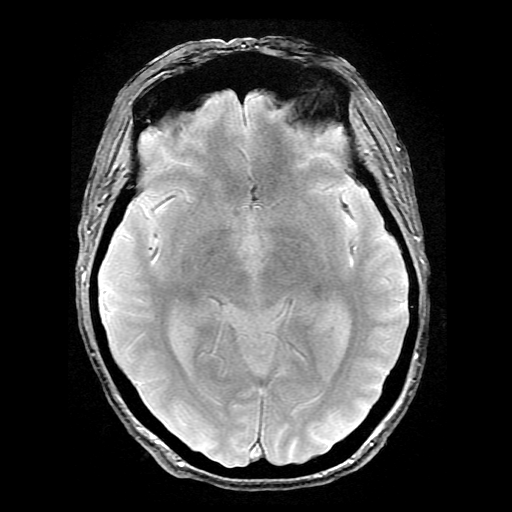

[Series 10: T2 · coronal · 5.0mm · 0.43mm/px · 3 of 28 slices shown (2 of 2)]
[im 1/28]
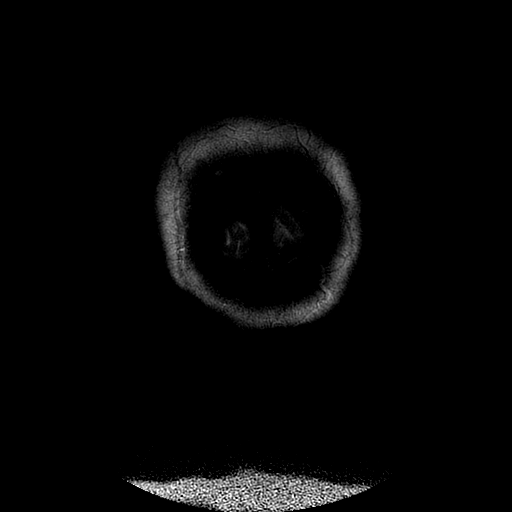
[im 14/28]
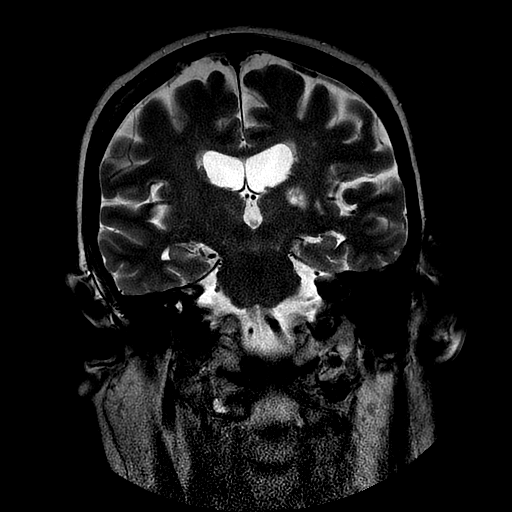
[im 28/28]
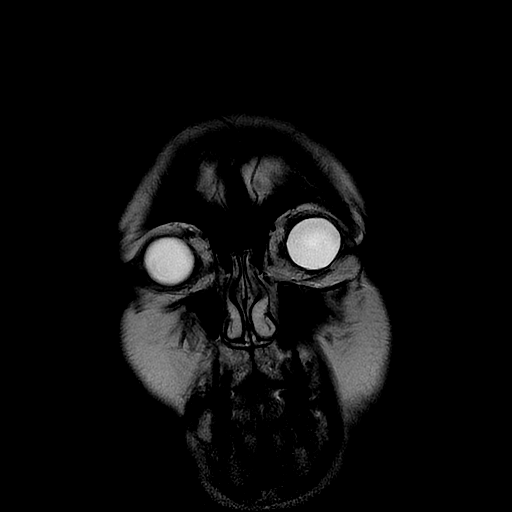

[Series 400: DWI · axial · 3.0mm · 1.09mm/px · z∈[-60,+76]mm · 5 of 47 slices shown (3 of 4)]
[im 1/47]
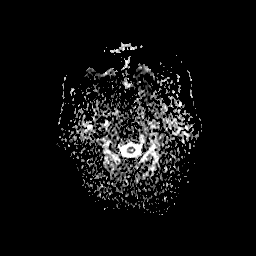
[im 12/47]
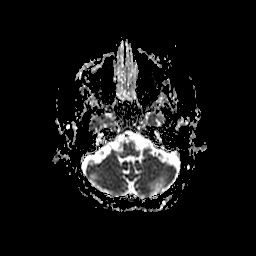
[im 24/47]
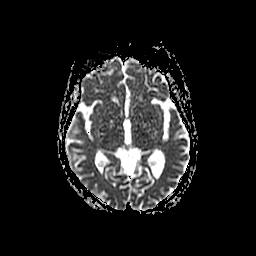
[im 35/47]
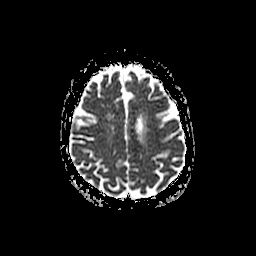
[im 47/47]
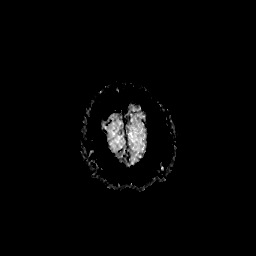

[Series 500: DWI · coronal · 5.0mm · 1.09mm/px · 3 of 33 slices shown (4 of 4)]
[im 1/33]
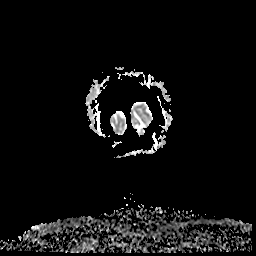
[im 17/33]
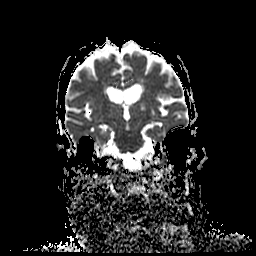
[im 33/33]
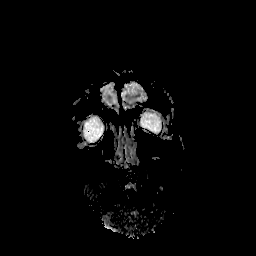

[35 of 48 positions shown; findings below may reference images not displayed]

FINDINGS: INTRACRANIAL CONTENTS: No reduced diffusion to suggest acute
ischemia. No susceptibility artifact to suggest hemorrhage. The
ventricles and sulci are normal for patient's age. No suspicious
parenchymal signal, masses or mass effect. A few scattered
subcentimeter supratentorial white matter T2 hyperintensities are
less than expected for age, most compatible with chronic small
vessel ischemic disease. Old LEFT thalamus and LEFT basal ganglia
lacunar infarcts. No abnormal extra-axial fluid collections. No
extra-axial masses though, contrast enhanced sequences would be more
sensitive. Normal major intracranial vascular flow voids present at
skull base.

ORBITS: The included ocular globes and orbital contents are
non-suspicious.

SINUSES: The mastoid air-cells and included paranasal sinuses are
well-aerated.

SKULL/SOFT TISSUES: No abnormal sellar expansion. No suspicious
calvarial bone marrow signal. Craniocervical junction maintained.
IMPRESSION: No acute intracranial process, specifically no acute ischemia.

Old LEFT basal ganglia and thalamus lacunar infarcts. Mild chronic
small vessel ischemic disease.

## 2017-10-25 DIAGNOSIS — I10 Essential (primary) hypertension: Secondary | ICD-10-CM | POA: Diagnosis not present

## 2017-10-25 DIAGNOSIS — N3000 Acute cystitis without hematuria: Secondary | ICD-10-CM | POA: Diagnosis not present

## 2017-10-25 DIAGNOSIS — E782 Mixed hyperlipidemia: Secondary | ICD-10-CM | POA: Diagnosis not present

## 2017-10-25 DIAGNOSIS — E1121 Type 2 diabetes mellitus with diabetic nephropathy: Secondary | ICD-10-CM | POA: Diagnosis not present

## 2017-10-25 DIAGNOSIS — D508 Other iron deficiency anemias: Secondary | ICD-10-CM | POA: Diagnosis not present

## 2017-10-25 DIAGNOSIS — E034 Atrophy of thyroid (acquired): Secondary | ICD-10-CM | POA: Diagnosis not present

## 2017-12-31 DIAGNOSIS — I69351 Hemiplegia and hemiparesis following cerebral infarction affecting right dominant side: Secondary | ICD-10-CM | POA: Diagnosis not present

## 2017-12-31 DIAGNOSIS — R5383 Other fatigue: Secondary | ICD-10-CM | POA: Diagnosis not present

## 2017-12-31 DIAGNOSIS — S0011XA Contusion of right eyelid and periocular area, initial encounter: Secondary | ICD-10-CM | POA: Diagnosis not present

## 2017-12-31 DIAGNOSIS — S5012XA Contusion of left forearm, initial encounter: Secondary | ICD-10-CM | POA: Diagnosis not present

## 2018-01-06 DIAGNOSIS — E039 Hypothyroidism, unspecified: Secondary | ICD-10-CM | POA: Diagnosis not present

## 2018-01-06 DIAGNOSIS — I69351 Hemiplegia and hemiparesis following cerebral infarction affecting right dominant side: Secondary | ICD-10-CM | POA: Diagnosis not present

## 2018-01-06 DIAGNOSIS — Z7982 Long term (current) use of aspirin: Secondary | ICD-10-CM | POA: Diagnosis not present

## 2018-01-06 DIAGNOSIS — K279 Peptic ulcer, site unspecified, unspecified as acute or chronic, without hemorrhage or perforation: Secondary | ICD-10-CM | POA: Diagnosis not present

## 2018-01-06 DIAGNOSIS — Z79891 Long term (current) use of opiate analgesic: Secondary | ICD-10-CM | POA: Diagnosis not present

## 2018-01-06 DIAGNOSIS — I252 Old myocardial infarction: Secondary | ICD-10-CM | POA: Diagnosis not present

## 2018-01-06 DIAGNOSIS — K449 Diaphragmatic hernia without obstruction or gangrene: Secondary | ICD-10-CM | POA: Diagnosis not present

## 2018-01-06 DIAGNOSIS — Z9181 History of falling: Secondary | ICD-10-CM | POA: Diagnosis not present

## 2018-01-06 DIAGNOSIS — Z951 Presence of aortocoronary bypass graft: Secondary | ICD-10-CM | POA: Diagnosis not present

## 2018-01-06 DIAGNOSIS — E119 Type 2 diabetes mellitus without complications: Secondary | ICD-10-CM | POA: Diagnosis not present

## 2018-01-06 DIAGNOSIS — K5730 Diverticulosis of large intestine without perforation or abscess without bleeding: Secondary | ICD-10-CM | POA: Diagnosis not present

## 2018-01-06 DIAGNOSIS — Z602 Problems related to living alone: Secondary | ICD-10-CM | POA: Diagnosis not present

## 2018-01-06 DIAGNOSIS — M48061 Spinal stenosis, lumbar region without neurogenic claudication: Secondary | ICD-10-CM | POA: Diagnosis not present

## 2018-01-06 DIAGNOSIS — J449 Chronic obstructive pulmonary disease, unspecified: Secondary | ICD-10-CM | POA: Diagnosis not present

## 2018-01-06 DIAGNOSIS — M810 Age-related osteoporosis without current pathological fracture: Secondary | ICD-10-CM | POA: Diagnosis not present

## 2018-01-06 DIAGNOSIS — M4802 Spinal stenosis, cervical region: Secondary | ICD-10-CM | POA: Diagnosis not present

## 2018-01-06 DIAGNOSIS — I2510 Atherosclerotic heart disease of native coronary artery without angina pectoris: Secondary | ICD-10-CM | POA: Diagnosis not present

## 2018-01-06 DIAGNOSIS — I255 Ischemic cardiomyopathy: Secondary | ICD-10-CM | POA: Diagnosis not present

## 2018-01-06 DIAGNOSIS — I509 Heart failure, unspecified: Secondary | ICD-10-CM | POA: Diagnosis not present

## 2018-01-06 DIAGNOSIS — I110 Hypertensive heart disease with heart failure: Secondary | ICD-10-CM | POA: Diagnosis not present

## 2018-01-08 DIAGNOSIS — J00 Acute nasopharyngitis [common cold]: Secondary | ICD-10-CM | POA: Diagnosis not present

## 2018-01-08 DIAGNOSIS — K5730 Diverticulosis of large intestine without perforation or abscess without bleeding: Secondary | ICD-10-CM | POA: Diagnosis not present

## 2018-01-08 DIAGNOSIS — I255 Ischemic cardiomyopathy: Secondary | ICD-10-CM | POA: Diagnosis not present

## 2018-01-08 DIAGNOSIS — K449 Diaphragmatic hernia without obstruction or gangrene: Secondary | ICD-10-CM | POA: Diagnosis not present

## 2018-01-08 DIAGNOSIS — I509 Heart failure, unspecified: Secondary | ICD-10-CM | POA: Diagnosis not present

## 2018-01-08 DIAGNOSIS — I10 Essential (primary) hypertension: Secondary | ICD-10-CM | POA: Diagnosis not present

## 2018-01-08 DIAGNOSIS — E119 Type 2 diabetes mellitus without complications: Secondary | ICD-10-CM | POA: Diagnosis not present

## 2018-01-08 DIAGNOSIS — E039 Hypothyroidism, unspecified: Secondary | ICD-10-CM | POA: Diagnosis not present

## 2018-01-08 DIAGNOSIS — M48061 Spinal stenosis, lumbar region without neurogenic claudication: Secondary | ICD-10-CM | POA: Diagnosis not present

## 2018-01-08 DIAGNOSIS — M4802 Spinal stenosis, cervical region: Secondary | ICD-10-CM | POA: Diagnosis not present

## 2018-01-08 DIAGNOSIS — Z9181 History of falling: Secondary | ICD-10-CM | POA: Diagnosis not present

## 2018-01-08 DIAGNOSIS — Z79891 Long term (current) use of opiate analgesic: Secondary | ICD-10-CM | POA: Diagnosis not present

## 2018-01-08 DIAGNOSIS — I110 Hypertensive heart disease with heart failure: Secondary | ICD-10-CM | POA: Diagnosis not present

## 2018-01-08 DIAGNOSIS — M810 Age-related osteoporosis without current pathological fracture: Secondary | ICD-10-CM | POA: Diagnosis not present

## 2018-01-08 DIAGNOSIS — Z951 Presence of aortocoronary bypass graft: Secondary | ICD-10-CM | POA: Diagnosis not present

## 2018-01-08 DIAGNOSIS — I69351 Hemiplegia and hemiparesis following cerebral infarction affecting right dominant side: Secondary | ICD-10-CM | POA: Diagnosis not present

## 2018-01-08 DIAGNOSIS — Z602 Problems related to living alone: Secondary | ICD-10-CM | POA: Diagnosis not present

## 2018-01-08 DIAGNOSIS — K279 Peptic ulcer, site unspecified, unspecified as acute or chronic, without hemorrhage or perforation: Secondary | ICD-10-CM | POA: Diagnosis not present

## 2018-01-08 DIAGNOSIS — I2510 Atherosclerotic heart disease of native coronary artery without angina pectoris: Secondary | ICD-10-CM | POA: Diagnosis not present

## 2018-01-08 DIAGNOSIS — I252 Old myocardial infarction: Secondary | ICD-10-CM | POA: Diagnosis not present

## 2018-01-08 DIAGNOSIS — J449 Chronic obstructive pulmonary disease, unspecified: Secondary | ICD-10-CM | POA: Diagnosis not present

## 2018-01-08 DIAGNOSIS — R5383 Other fatigue: Secondary | ICD-10-CM | POA: Diagnosis not present

## 2018-01-08 DIAGNOSIS — Z7982 Long term (current) use of aspirin: Secondary | ICD-10-CM | POA: Diagnosis not present

## 2018-01-09 DIAGNOSIS — M810 Age-related osteoporosis without current pathological fracture: Secondary | ICD-10-CM | POA: Diagnosis not present

## 2018-01-09 DIAGNOSIS — I509 Heart failure, unspecified: Secondary | ICD-10-CM | POA: Diagnosis not present

## 2018-01-09 DIAGNOSIS — I252 Old myocardial infarction: Secondary | ICD-10-CM | POA: Diagnosis not present

## 2018-01-09 DIAGNOSIS — I69351 Hemiplegia and hemiparesis following cerebral infarction affecting right dominant side: Secondary | ICD-10-CM | POA: Diagnosis not present

## 2018-01-09 DIAGNOSIS — I2510 Atherosclerotic heart disease of native coronary artery without angina pectoris: Secondary | ICD-10-CM | POA: Diagnosis not present

## 2018-01-09 DIAGNOSIS — Z602 Problems related to living alone: Secondary | ICD-10-CM | POA: Diagnosis not present

## 2018-01-09 DIAGNOSIS — J449 Chronic obstructive pulmonary disease, unspecified: Secondary | ICD-10-CM | POA: Diagnosis not present

## 2018-01-09 DIAGNOSIS — M4802 Spinal stenosis, cervical region: Secondary | ICD-10-CM | POA: Diagnosis not present

## 2018-01-09 DIAGNOSIS — E039 Hypothyroidism, unspecified: Secondary | ICD-10-CM | POA: Diagnosis not present

## 2018-01-09 DIAGNOSIS — K449 Diaphragmatic hernia without obstruction or gangrene: Secondary | ICD-10-CM | POA: Diagnosis not present

## 2018-01-09 DIAGNOSIS — E119 Type 2 diabetes mellitus without complications: Secondary | ICD-10-CM | POA: Diagnosis not present

## 2018-01-09 DIAGNOSIS — Z79891 Long term (current) use of opiate analgesic: Secondary | ICD-10-CM | POA: Diagnosis not present

## 2018-01-09 DIAGNOSIS — K5730 Diverticulosis of large intestine without perforation or abscess without bleeding: Secondary | ICD-10-CM | POA: Diagnosis not present

## 2018-01-09 DIAGNOSIS — Z951 Presence of aortocoronary bypass graft: Secondary | ICD-10-CM | POA: Diagnosis not present

## 2018-01-09 DIAGNOSIS — I110 Hypertensive heart disease with heart failure: Secondary | ICD-10-CM | POA: Diagnosis not present

## 2018-01-09 DIAGNOSIS — Z9181 History of falling: Secondary | ICD-10-CM | POA: Diagnosis not present

## 2018-01-09 DIAGNOSIS — K279 Peptic ulcer, site unspecified, unspecified as acute or chronic, without hemorrhage or perforation: Secondary | ICD-10-CM | POA: Diagnosis not present

## 2018-01-09 DIAGNOSIS — I255 Ischemic cardiomyopathy: Secondary | ICD-10-CM | POA: Diagnosis not present

## 2018-01-09 DIAGNOSIS — Z7982 Long term (current) use of aspirin: Secondary | ICD-10-CM | POA: Diagnosis not present

## 2018-01-09 DIAGNOSIS — M48061 Spinal stenosis, lumbar region without neurogenic claudication: Secondary | ICD-10-CM | POA: Diagnosis not present

## 2018-01-10 DIAGNOSIS — Z951 Presence of aortocoronary bypass graft: Secondary | ICD-10-CM | POA: Diagnosis not present

## 2018-01-10 DIAGNOSIS — K279 Peptic ulcer, site unspecified, unspecified as acute or chronic, without hemorrhage or perforation: Secondary | ICD-10-CM | POA: Diagnosis not present

## 2018-01-10 DIAGNOSIS — Z9181 History of falling: Secondary | ICD-10-CM | POA: Diagnosis not present

## 2018-01-10 DIAGNOSIS — I252 Old myocardial infarction: Secondary | ICD-10-CM | POA: Diagnosis not present

## 2018-01-10 DIAGNOSIS — E039 Hypothyroidism, unspecified: Secondary | ICD-10-CM | POA: Diagnosis not present

## 2018-01-10 DIAGNOSIS — I2510 Atherosclerotic heart disease of native coronary artery without angina pectoris: Secondary | ICD-10-CM | POA: Diagnosis not present

## 2018-01-10 DIAGNOSIS — I255 Ischemic cardiomyopathy: Secondary | ICD-10-CM | POA: Diagnosis not present

## 2018-01-10 DIAGNOSIS — Z79891 Long term (current) use of opiate analgesic: Secondary | ICD-10-CM | POA: Diagnosis not present

## 2018-01-10 DIAGNOSIS — E119 Type 2 diabetes mellitus without complications: Secondary | ICD-10-CM | POA: Diagnosis not present

## 2018-01-10 DIAGNOSIS — K5730 Diverticulosis of large intestine without perforation or abscess without bleeding: Secondary | ICD-10-CM | POA: Diagnosis not present

## 2018-01-10 DIAGNOSIS — K449 Diaphragmatic hernia without obstruction or gangrene: Secondary | ICD-10-CM | POA: Diagnosis not present

## 2018-01-10 DIAGNOSIS — I509 Heart failure, unspecified: Secondary | ICD-10-CM | POA: Diagnosis not present

## 2018-01-10 DIAGNOSIS — J449 Chronic obstructive pulmonary disease, unspecified: Secondary | ICD-10-CM | POA: Diagnosis not present

## 2018-01-10 DIAGNOSIS — Z7982 Long term (current) use of aspirin: Secondary | ICD-10-CM | POA: Diagnosis not present

## 2018-01-10 DIAGNOSIS — M4802 Spinal stenosis, cervical region: Secondary | ICD-10-CM | POA: Diagnosis not present

## 2018-01-10 DIAGNOSIS — I110 Hypertensive heart disease with heart failure: Secondary | ICD-10-CM | POA: Diagnosis not present

## 2018-01-10 DIAGNOSIS — M48061 Spinal stenosis, lumbar region without neurogenic claudication: Secondary | ICD-10-CM | POA: Diagnosis not present

## 2018-01-10 DIAGNOSIS — I69351 Hemiplegia and hemiparesis following cerebral infarction affecting right dominant side: Secondary | ICD-10-CM | POA: Diagnosis not present

## 2018-01-10 DIAGNOSIS — Z602 Problems related to living alone: Secondary | ICD-10-CM | POA: Diagnosis not present

## 2018-01-10 DIAGNOSIS — M810 Age-related osteoporosis without current pathological fracture: Secondary | ICD-10-CM | POA: Diagnosis not present

## 2018-01-13 DIAGNOSIS — I252 Old myocardial infarction: Secondary | ICD-10-CM | POA: Diagnosis not present

## 2018-01-13 DIAGNOSIS — Z7982 Long term (current) use of aspirin: Secondary | ICD-10-CM | POA: Diagnosis not present

## 2018-01-13 DIAGNOSIS — I69351 Hemiplegia and hemiparesis following cerebral infarction affecting right dominant side: Secondary | ICD-10-CM | POA: Diagnosis not present

## 2018-01-13 DIAGNOSIS — K5730 Diverticulosis of large intestine without perforation or abscess without bleeding: Secondary | ICD-10-CM | POA: Diagnosis not present

## 2018-01-13 DIAGNOSIS — K279 Peptic ulcer, site unspecified, unspecified as acute or chronic, without hemorrhage or perforation: Secondary | ICD-10-CM | POA: Diagnosis not present

## 2018-01-13 DIAGNOSIS — I509 Heart failure, unspecified: Secondary | ICD-10-CM | POA: Diagnosis not present

## 2018-01-13 DIAGNOSIS — K449 Diaphragmatic hernia without obstruction or gangrene: Secondary | ICD-10-CM | POA: Diagnosis not present

## 2018-01-13 DIAGNOSIS — Z951 Presence of aortocoronary bypass graft: Secondary | ICD-10-CM | POA: Diagnosis not present

## 2018-01-13 DIAGNOSIS — I255 Ischemic cardiomyopathy: Secondary | ICD-10-CM | POA: Diagnosis not present

## 2018-01-13 DIAGNOSIS — E119 Type 2 diabetes mellitus without complications: Secondary | ICD-10-CM | POA: Diagnosis not present

## 2018-01-13 DIAGNOSIS — M48061 Spinal stenosis, lumbar region without neurogenic claudication: Secondary | ICD-10-CM | POA: Diagnosis not present

## 2018-01-13 DIAGNOSIS — E039 Hypothyroidism, unspecified: Secondary | ICD-10-CM | POA: Diagnosis not present

## 2018-01-13 DIAGNOSIS — I110 Hypertensive heart disease with heart failure: Secondary | ICD-10-CM | POA: Diagnosis not present

## 2018-01-13 DIAGNOSIS — J449 Chronic obstructive pulmonary disease, unspecified: Secondary | ICD-10-CM | POA: Diagnosis not present

## 2018-01-13 DIAGNOSIS — I2510 Atherosclerotic heart disease of native coronary artery without angina pectoris: Secondary | ICD-10-CM | POA: Diagnosis not present

## 2018-01-13 DIAGNOSIS — Z9181 History of falling: Secondary | ICD-10-CM | POA: Diagnosis not present

## 2018-01-13 DIAGNOSIS — Z79891 Long term (current) use of opiate analgesic: Secondary | ICD-10-CM | POA: Diagnosis not present

## 2018-01-13 DIAGNOSIS — Z602 Problems related to living alone: Secondary | ICD-10-CM | POA: Diagnosis not present

## 2018-01-13 DIAGNOSIS — M810 Age-related osteoporosis without current pathological fracture: Secondary | ICD-10-CM | POA: Diagnosis not present

## 2018-01-13 DIAGNOSIS — M4802 Spinal stenosis, cervical region: Secondary | ICD-10-CM | POA: Diagnosis not present

## 2018-01-14 DIAGNOSIS — M810 Age-related osteoporosis without current pathological fracture: Secondary | ICD-10-CM | POA: Diagnosis not present

## 2018-01-14 DIAGNOSIS — K279 Peptic ulcer, site unspecified, unspecified as acute or chronic, without hemorrhage or perforation: Secondary | ICD-10-CM | POA: Diagnosis not present

## 2018-01-14 DIAGNOSIS — J449 Chronic obstructive pulmonary disease, unspecified: Secondary | ICD-10-CM | POA: Diagnosis not present

## 2018-01-14 DIAGNOSIS — K5730 Diverticulosis of large intestine without perforation or abscess without bleeding: Secondary | ICD-10-CM | POA: Diagnosis not present

## 2018-01-14 DIAGNOSIS — M4802 Spinal stenosis, cervical region: Secondary | ICD-10-CM | POA: Diagnosis not present

## 2018-01-14 DIAGNOSIS — I2510 Atherosclerotic heart disease of native coronary artery without angina pectoris: Secondary | ICD-10-CM | POA: Diagnosis not present

## 2018-01-14 DIAGNOSIS — K449 Diaphragmatic hernia without obstruction or gangrene: Secondary | ICD-10-CM | POA: Diagnosis not present

## 2018-01-14 DIAGNOSIS — I252 Old myocardial infarction: Secondary | ICD-10-CM | POA: Diagnosis not present

## 2018-01-14 DIAGNOSIS — I110 Hypertensive heart disease with heart failure: Secondary | ICD-10-CM | POA: Diagnosis not present

## 2018-01-14 DIAGNOSIS — Z7982 Long term (current) use of aspirin: Secondary | ICD-10-CM | POA: Diagnosis not present

## 2018-01-14 DIAGNOSIS — Z951 Presence of aortocoronary bypass graft: Secondary | ICD-10-CM | POA: Diagnosis not present

## 2018-01-14 DIAGNOSIS — Z602 Problems related to living alone: Secondary | ICD-10-CM | POA: Diagnosis not present

## 2018-01-14 DIAGNOSIS — M48061 Spinal stenosis, lumbar region without neurogenic claudication: Secondary | ICD-10-CM | POA: Diagnosis not present

## 2018-01-14 DIAGNOSIS — Z9181 History of falling: Secondary | ICD-10-CM | POA: Diagnosis not present

## 2018-01-14 DIAGNOSIS — Z79891 Long term (current) use of opiate analgesic: Secondary | ICD-10-CM | POA: Diagnosis not present

## 2018-01-14 DIAGNOSIS — I69351 Hemiplegia and hemiparesis following cerebral infarction affecting right dominant side: Secondary | ICD-10-CM | POA: Diagnosis not present

## 2018-01-14 DIAGNOSIS — I509 Heart failure, unspecified: Secondary | ICD-10-CM | POA: Diagnosis not present

## 2018-01-14 DIAGNOSIS — I255 Ischemic cardiomyopathy: Secondary | ICD-10-CM | POA: Diagnosis not present

## 2018-01-14 DIAGNOSIS — E119 Type 2 diabetes mellitus without complications: Secondary | ICD-10-CM | POA: Diagnosis not present

## 2018-01-14 DIAGNOSIS — E039 Hypothyroidism, unspecified: Secondary | ICD-10-CM | POA: Diagnosis not present

## 2018-01-15 DIAGNOSIS — I252 Old myocardial infarction: Secondary | ICD-10-CM | POA: Diagnosis not present

## 2018-01-15 DIAGNOSIS — I255 Ischemic cardiomyopathy: Secondary | ICD-10-CM | POA: Diagnosis not present

## 2018-01-15 DIAGNOSIS — Z9181 History of falling: Secondary | ICD-10-CM | POA: Diagnosis not present

## 2018-01-15 DIAGNOSIS — M48061 Spinal stenosis, lumbar region without neurogenic claudication: Secondary | ICD-10-CM | POA: Diagnosis not present

## 2018-01-15 DIAGNOSIS — K5730 Diverticulosis of large intestine without perforation or abscess without bleeding: Secondary | ICD-10-CM | POA: Diagnosis not present

## 2018-01-15 DIAGNOSIS — E039 Hypothyroidism, unspecified: Secondary | ICD-10-CM | POA: Diagnosis not present

## 2018-01-15 DIAGNOSIS — J449 Chronic obstructive pulmonary disease, unspecified: Secondary | ICD-10-CM | POA: Diagnosis not present

## 2018-01-15 DIAGNOSIS — Z79891 Long term (current) use of opiate analgesic: Secondary | ICD-10-CM | POA: Diagnosis not present

## 2018-01-15 DIAGNOSIS — I69351 Hemiplegia and hemiparesis following cerebral infarction affecting right dominant side: Secondary | ICD-10-CM | POA: Diagnosis not present

## 2018-01-15 DIAGNOSIS — I509 Heart failure, unspecified: Secondary | ICD-10-CM | POA: Diagnosis not present

## 2018-01-15 DIAGNOSIS — E119 Type 2 diabetes mellitus without complications: Secondary | ICD-10-CM | POA: Diagnosis not present

## 2018-01-15 DIAGNOSIS — Z7982 Long term (current) use of aspirin: Secondary | ICD-10-CM | POA: Diagnosis not present

## 2018-01-15 DIAGNOSIS — Z602 Problems related to living alone: Secondary | ICD-10-CM | POA: Diagnosis not present

## 2018-01-15 DIAGNOSIS — M4802 Spinal stenosis, cervical region: Secondary | ICD-10-CM | POA: Diagnosis not present

## 2018-01-15 DIAGNOSIS — M810 Age-related osteoporosis without current pathological fracture: Secondary | ICD-10-CM | POA: Diagnosis not present

## 2018-01-15 DIAGNOSIS — I2510 Atherosclerotic heart disease of native coronary artery without angina pectoris: Secondary | ICD-10-CM | POA: Diagnosis not present

## 2018-01-15 DIAGNOSIS — I110 Hypertensive heart disease with heart failure: Secondary | ICD-10-CM | POA: Diagnosis not present

## 2018-01-15 DIAGNOSIS — K279 Peptic ulcer, site unspecified, unspecified as acute or chronic, without hemorrhage or perforation: Secondary | ICD-10-CM | POA: Diagnosis not present

## 2018-01-15 DIAGNOSIS — Z951 Presence of aortocoronary bypass graft: Secondary | ICD-10-CM | POA: Diagnosis not present

## 2018-01-15 DIAGNOSIS — K449 Diaphragmatic hernia without obstruction or gangrene: Secondary | ICD-10-CM | POA: Diagnosis not present

## 2018-01-17 DIAGNOSIS — J449 Chronic obstructive pulmonary disease, unspecified: Secondary | ICD-10-CM | POA: Diagnosis not present

## 2018-01-17 DIAGNOSIS — Z951 Presence of aortocoronary bypass graft: Secondary | ICD-10-CM | POA: Diagnosis not present

## 2018-01-17 DIAGNOSIS — I2510 Atherosclerotic heart disease of native coronary artery without angina pectoris: Secondary | ICD-10-CM | POA: Diagnosis not present

## 2018-01-17 DIAGNOSIS — M4802 Spinal stenosis, cervical region: Secondary | ICD-10-CM | POA: Diagnosis not present

## 2018-01-17 DIAGNOSIS — I252 Old myocardial infarction: Secondary | ICD-10-CM | POA: Diagnosis not present

## 2018-01-17 DIAGNOSIS — Z602 Problems related to living alone: Secondary | ICD-10-CM | POA: Diagnosis not present

## 2018-01-17 DIAGNOSIS — K5730 Diverticulosis of large intestine without perforation or abscess without bleeding: Secondary | ICD-10-CM | POA: Diagnosis not present

## 2018-01-17 DIAGNOSIS — Z7982 Long term (current) use of aspirin: Secondary | ICD-10-CM | POA: Diagnosis not present

## 2018-01-17 DIAGNOSIS — M810 Age-related osteoporosis without current pathological fracture: Secondary | ICD-10-CM | POA: Diagnosis not present

## 2018-01-17 DIAGNOSIS — E119 Type 2 diabetes mellitus without complications: Secondary | ICD-10-CM | POA: Diagnosis not present

## 2018-01-17 DIAGNOSIS — I110 Hypertensive heart disease with heart failure: Secondary | ICD-10-CM | POA: Diagnosis not present

## 2018-01-17 DIAGNOSIS — I69351 Hemiplegia and hemiparesis following cerebral infarction affecting right dominant side: Secondary | ICD-10-CM | POA: Diagnosis not present

## 2018-01-17 DIAGNOSIS — M48061 Spinal stenosis, lumbar region without neurogenic claudication: Secondary | ICD-10-CM | POA: Diagnosis not present

## 2018-01-17 DIAGNOSIS — K279 Peptic ulcer, site unspecified, unspecified as acute or chronic, without hemorrhage or perforation: Secondary | ICD-10-CM | POA: Diagnosis not present

## 2018-01-17 DIAGNOSIS — E039 Hypothyroidism, unspecified: Secondary | ICD-10-CM | POA: Diagnosis not present

## 2018-01-17 DIAGNOSIS — Z79891 Long term (current) use of opiate analgesic: Secondary | ICD-10-CM | POA: Diagnosis not present

## 2018-01-17 DIAGNOSIS — Z9181 History of falling: Secondary | ICD-10-CM | POA: Diagnosis not present

## 2018-01-17 DIAGNOSIS — I255 Ischemic cardiomyopathy: Secondary | ICD-10-CM | POA: Diagnosis not present

## 2018-01-17 DIAGNOSIS — K449 Diaphragmatic hernia without obstruction or gangrene: Secondary | ICD-10-CM | POA: Diagnosis not present

## 2018-01-17 DIAGNOSIS — I509 Heart failure, unspecified: Secondary | ICD-10-CM | POA: Diagnosis not present

## 2018-01-20 DIAGNOSIS — J449 Chronic obstructive pulmonary disease, unspecified: Secondary | ICD-10-CM | POA: Diagnosis not present

## 2018-01-20 DIAGNOSIS — I69351 Hemiplegia and hemiparesis following cerebral infarction affecting right dominant side: Secondary | ICD-10-CM | POA: Diagnosis not present

## 2018-01-20 DIAGNOSIS — M4802 Spinal stenosis, cervical region: Secondary | ICD-10-CM | POA: Diagnosis not present

## 2018-01-20 DIAGNOSIS — I2510 Atherosclerotic heart disease of native coronary artery without angina pectoris: Secondary | ICD-10-CM | POA: Diagnosis not present

## 2018-01-20 DIAGNOSIS — Z7982 Long term (current) use of aspirin: Secondary | ICD-10-CM | POA: Diagnosis not present

## 2018-01-20 DIAGNOSIS — Z9181 History of falling: Secondary | ICD-10-CM | POA: Diagnosis not present

## 2018-01-20 DIAGNOSIS — Z602 Problems related to living alone: Secondary | ICD-10-CM | POA: Diagnosis not present

## 2018-01-20 DIAGNOSIS — K5730 Diverticulosis of large intestine without perforation or abscess without bleeding: Secondary | ICD-10-CM | POA: Diagnosis not present

## 2018-01-20 DIAGNOSIS — I255 Ischemic cardiomyopathy: Secondary | ICD-10-CM | POA: Diagnosis not present

## 2018-01-20 DIAGNOSIS — M810 Age-related osteoporosis without current pathological fracture: Secondary | ICD-10-CM | POA: Diagnosis not present

## 2018-01-20 DIAGNOSIS — Z951 Presence of aortocoronary bypass graft: Secondary | ICD-10-CM | POA: Diagnosis not present

## 2018-01-20 DIAGNOSIS — I509 Heart failure, unspecified: Secondary | ICD-10-CM | POA: Diagnosis not present

## 2018-01-20 DIAGNOSIS — I110 Hypertensive heart disease with heart failure: Secondary | ICD-10-CM | POA: Diagnosis not present

## 2018-01-20 DIAGNOSIS — K449 Diaphragmatic hernia without obstruction or gangrene: Secondary | ICD-10-CM | POA: Diagnosis not present

## 2018-01-20 DIAGNOSIS — K279 Peptic ulcer, site unspecified, unspecified as acute or chronic, without hemorrhage or perforation: Secondary | ICD-10-CM | POA: Diagnosis not present

## 2018-01-20 DIAGNOSIS — Z79891 Long term (current) use of opiate analgesic: Secondary | ICD-10-CM | POA: Diagnosis not present

## 2018-01-20 DIAGNOSIS — M48061 Spinal stenosis, lumbar region without neurogenic claudication: Secondary | ICD-10-CM | POA: Diagnosis not present

## 2018-01-20 DIAGNOSIS — I252 Old myocardial infarction: Secondary | ICD-10-CM | POA: Diagnosis not present

## 2018-01-20 DIAGNOSIS — E119 Type 2 diabetes mellitus without complications: Secondary | ICD-10-CM | POA: Diagnosis not present

## 2018-01-20 DIAGNOSIS — E039 Hypothyroidism, unspecified: Secondary | ICD-10-CM | POA: Diagnosis not present

## 2018-01-21 DIAGNOSIS — I2510 Atherosclerotic heart disease of native coronary artery without angina pectoris: Secondary | ICD-10-CM | POA: Diagnosis not present

## 2018-01-21 DIAGNOSIS — I252 Old myocardial infarction: Secondary | ICD-10-CM | POA: Diagnosis not present

## 2018-01-21 DIAGNOSIS — M48061 Spinal stenosis, lumbar region without neurogenic claudication: Secondary | ICD-10-CM | POA: Diagnosis not present

## 2018-01-21 DIAGNOSIS — K449 Diaphragmatic hernia without obstruction or gangrene: Secondary | ICD-10-CM | POA: Diagnosis not present

## 2018-01-21 DIAGNOSIS — E039 Hypothyroidism, unspecified: Secondary | ICD-10-CM | POA: Diagnosis not present

## 2018-01-21 DIAGNOSIS — M810 Age-related osteoporosis without current pathological fracture: Secondary | ICD-10-CM | POA: Diagnosis not present

## 2018-01-21 DIAGNOSIS — Z9181 History of falling: Secondary | ICD-10-CM | POA: Diagnosis not present

## 2018-01-21 DIAGNOSIS — M4802 Spinal stenosis, cervical region: Secondary | ICD-10-CM | POA: Diagnosis not present

## 2018-01-21 DIAGNOSIS — J449 Chronic obstructive pulmonary disease, unspecified: Secondary | ICD-10-CM | POA: Diagnosis not present

## 2018-01-21 DIAGNOSIS — Z951 Presence of aortocoronary bypass graft: Secondary | ICD-10-CM | POA: Diagnosis not present

## 2018-01-21 DIAGNOSIS — I110 Hypertensive heart disease with heart failure: Secondary | ICD-10-CM | POA: Diagnosis not present

## 2018-01-21 DIAGNOSIS — I69351 Hemiplegia and hemiparesis following cerebral infarction affecting right dominant side: Secondary | ICD-10-CM | POA: Diagnosis not present

## 2018-01-21 DIAGNOSIS — I509 Heart failure, unspecified: Secondary | ICD-10-CM | POA: Diagnosis not present

## 2018-01-21 DIAGNOSIS — Z79891 Long term (current) use of opiate analgesic: Secondary | ICD-10-CM | POA: Diagnosis not present

## 2018-01-21 DIAGNOSIS — Z7982 Long term (current) use of aspirin: Secondary | ICD-10-CM | POA: Diagnosis not present

## 2018-01-21 DIAGNOSIS — Z602 Problems related to living alone: Secondary | ICD-10-CM | POA: Diagnosis not present

## 2018-01-21 DIAGNOSIS — E119 Type 2 diabetes mellitus without complications: Secondary | ICD-10-CM | POA: Diagnosis not present

## 2018-01-21 DIAGNOSIS — K279 Peptic ulcer, site unspecified, unspecified as acute or chronic, without hemorrhage or perforation: Secondary | ICD-10-CM | POA: Diagnosis not present

## 2018-01-21 DIAGNOSIS — I255 Ischemic cardiomyopathy: Secondary | ICD-10-CM | POA: Diagnosis not present

## 2018-01-21 DIAGNOSIS — K5730 Diverticulosis of large intestine without perforation or abscess without bleeding: Secondary | ICD-10-CM | POA: Diagnosis not present

## 2018-01-22 DIAGNOSIS — I110 Hypertensive heart disease with heart failure: Secondary | ICD-10-CM | POA: Diagnosis not present

## 2018-01-22 DIAGNOSIS — I509 Heart failure, unspecified: Secondary | ICD-10-CM | POA: Diagnosis not present

## 2018-01-22 DIAGNOSIS — E119 Type 2 diabetes mellitus without complications: Secondary | ICD-10-CM | POA: Diagnosis not present

## 2018-01-22 DIAGNOSIS — M4802 Spinal stenosis, cervical region: Secondary | ICD-10-CM | POA: Diagnosis not present

## 2018-01-22 DIAGNOSIS — K449 Diaphragmatic hernia without obstruction or gangrene: Secondary | ICD-10-CM | POA: Diagnosis not present

## 2018-01-22 DIAGNOSIS — Z602 Problems related to living alone: Secondary | ICD-10-CM | POA: Diagnosis not present

## 2018-01-22 DIAGNOSIS — J449 Chronic obstructive pulmonary disease, unspecified: Secondary | ICD-10-CM | POA: Diagnosis not present

## 2018-01-22 DIAGNOSIS — Z79891 Long term (current) use of opiate analgesic: Secondary | ICD-10-CM | POA: Diagnosis not present

## 2018-01-22 DIAGNOSIS — I2510 Atherosclerotic heart disease of native coronary artery without angina pectoris: Secondary | ICD-10-CM | POA: Diagnosis not present

## 2018-01-22 DIAGNOSIS — Z951 Presence of aortocoronary bypass graft: Secondary | ICD-10-CM | POA: Diagnosis not present

## 2018-01-22 DIAGNOSIS — M810 Age-related osteoporosis without current pathological fracture: Secondary | ICD-10-CM | POA: Diagnosis not present

## 2018-01-22 DIAGNOSIS — K5730 Diverticulosis of large intestine without perforation or abscess without bleeding: Secondary | ICD-10-CM | POA: Diagnosis not present

## 2018-01-22 DIAGNOSIS — I252 Old myocardial infarction: Secondary | ICD-10-CM | POA: Diagnosis not present

## 2018-01-22 DIAGNOSIS — Z7982 Long term (current) use of aspirin: Secondary | ICD-10-CM | POA: Diagnosis not present

## 2018-01-22 DIAGNOSIS — I69351 Hemiplegia and hemiparesis following cerebral infarction affecting right dominant side: Secondary | ICD-10-CM | POA: Diagnosis not present

## 2018-01-22 DIAGNOSIS — E039 Hypothyroidism, unspecified: Secondary | ICD-10-CM | POA: Diagnosis not present

## 2018-01-22 DIAGNOSIS — I255 Ischemic cardiomyopathy: Secondary | ICD-10-CM | POA: Diagnosis not present

## 2018-01-22 DIAGNOSIS — K279 Peptic ulcer, site unspecified, unspecified as acute or chronic, without hemorrhage or perforation: Secondary | ICD-10-CM | POA: Diagnosis not present

## 2018-01-22 DIAGNOSIS — Z9181 History of falling: Secondary | ICD-10-CM | POA: Diagnosis not present

## 2018-01-22 DIAGNOSIS — M48061 Spinal stenosis, lumbar region without neurogenic claudication: Secondary | ICD-10-CM | POA: Diagnosis not present

## 2018-01-27 DIAGNOSIS — Z9181 History of falling: Secondary | ICD-10-CM | POA: Diagnosis not present

## 2018-01-27 DIAGNOSIS — E039 Hypothyroidism, unspecified: Secondary | ICD-10-CM | POA: Diagnosis not present

## 2018-01-27 DIAGNOSIS — I69351 Hemiplegia and hemiparesis following cerebral infarction affecting right dominant side: Secondary | ICD-10-CM | POA: Diagnosis not present

## 2018-01-27 DIAGNOSIS — I255 Ischemic cardiomyopathy: Secondary | ICD-10-CM | POA: Diagnosis not present

## 2018-01-27 DIAGNOSIS — Z7982 Long term (current) use of aspirin: Secondary | ICD-10-CM | POA: Diagnosis not present

## 2018-01-27 DIAGNOSIS — I110 Hypertensive heart disease with heart failure: Secondary | ICD-10-CM | POA: Diagnosis not present

## 2018-01-27 DIAGNOSIS — K279 Peptic ulcer, site unspecified, unspecified as acute or chronic, without hemorrhage or perforation: Secondary | ICD-10-CM | POA: Diagnosis not present

## 2018-01-27 DIAGNOSIS — Z602 Problems related to living alone: Secondary | ICD-10-CM | POA: Diagnosis not present

## 2018-01-27 DIAGNOSIS — M48061 Spinal stenosis, lumbar region without neurogenic claudication: Secondary | ICD-10-CM | POA: Diagnosis not present

## 2018-01-27 DIAGNOSIS — K449 Diaphragmatic hernia without obstruction or gangrene: Secondary | ICD-10-CM | POA: Diagnosis not present

## 2018-01-27 DIAGNOSIS — I2510 Atherosclerotic heart disease of native coronary artery without angina pectoris: Secondary | ICD-10-CM | POA: Diagnosis not present

## 2018-01-27 DIAGNOSIS — J449 Chronic obstructive pulmonary disease, unspecified: Secondary | ICD-10-CM | POA: Diagnosis not present

## 2018-01-27 DIAGNOSIS — K5730 Diverticulosis of large intestine without perforation or abscess without bleeding: Secondary | ICD-10-CM | POA: Diagnosis not present

## 2018-01-27 DIAGNOSIS — M4802 Spinal stenosis, cervical region: Secondary | ICD-10-CM | POA: Diagnosis not present

## 2018-01-27 DIAGNOSIS — E119 Type 2 diabetes mellitus without complications: Secondary | ICD-10-CM | POA: Diagnosis not present

## 2018-01-27 DIAGNOSIS — Z951 Presence of aortocoronary bypass graft: Secondary | ICD-10-CM | POA: Diagnosis not present

## 2018-01-27 DIAGNOSIS — M810 Age-related osteoporosis without current pathological fracture: Secondary | ICD-10-CM | POA: Diagnosis not present

## 2018-01-27 DIAGNOSIS — I509 Heart failure, unspecified: Secondary | ICD-10-CM | POA: Diagnosis not present

## 2018-01-27 DIAGNOSIS — Z79891 Long term (current) use of opiate analgesic: Secondary | ICD-10-CM | POA: Diagnosis not present

## 2018-01-27 DIAGNOSIS — I252 Old myocardial infarction: Secondary | ICD-10-CM | POA: Diagnosis not present

## 2018-01-28 DIAGNOSIS — E1142 Type 2 diabetes mellitus with diabetic polyneuropathy: Secondary | ICD-10-CM | POA: Diagnosis not present

## 2018-01-28 DIAGNOSIS — E782 Mixed hyperlipidemia: Secondary | ICD-10-CM | POA: Diagnosis not present

## 2018-01-28 DIAGNOSIS — E1121 Type 2 diabetes mellitus with diabetic nephropathy: Secondary | ICD-10-CM | POA: Diagnosis not present

## 2018-01-28 DIAGNOSIS — N6321 Unspecified lump in the left breast, upper outer quadrant: Secondary | ICD-10-CM | POA: Diagnosis not present

## 2018-01-28 DIAGNOSIS — I10 Essential (primary) hypertension: Secondary | ICD-10-CM | POA: Diagnosis not present

## 2018-01-28 DIAGNOSIS — I110 Hypertensive heart disease with heart failure: Secondary | ICD-10-CM | POA: Diagnosis not present

## 2018-01-29 DIAGNOSIS — K5730 Diverticulosis of large intestine without perforation or abscess without bleeding: Secondary | ICD-10-CM | POA: Diagnosis not present

## 2018-01-29 DIAGNOSIS — I509 Heart failure, unspecified: Secondary | ICD-10-CM | POA: Diagnosis not present

## 2018-01-29 DIAGNOSIS — M810 Age-related osteoporosis without current pathological fracture: Secondary | ICD-10-CM | POA: Diagnosis not present

## 2018-01-29 DIAGNOSIS — Z602 Problems related to living alone: Secondary | ICD-10-CM | POA: Diagnosis not present

## 2018-01-29 DIAGNOSIS — K449 Diaphragmatic hernia without obstruction or gangrene: Secondary | ICD-10-CM | POA: Diagnosis not present

## 2018-01-29 DIAGNOSIS — M48061 Spinal stenosis, lumbar region without neurogenic claudication: Secondary | ICD-10-CM | POA: Diagnosis not present

## 2018-01-29 DIAGNOSIS — I2510 Atherosclerotic heart disease of native coronary artery without angina pectoris: Secondary | ICD-10-CM | POA: Diagnosis not present

## 2018-01-29 DIAGNOSIS — Z79891 Long term (current) use of opiate analgesic: Secondary | ICD-10-CM | POA: Diagnosis not present

## 2018-01-29 DIAGNOSIS — E039 Hypothyroidism, unspecified: Secondary | ICD-10-CM | POA: Diagnosis not present

## 2018-01-29 DIAGNOSIS — I69351 Hemiplegia and hemiparesis following cerebral infarction affecting right dominant side: Secondary | ICD-10-CM | POA: Diagnosis not present

## 2018-01-29 DIAGNOSIS — Z7982 Long term (current) use of aspirin: Secondary | ICD-10-CM | POA: Diagnosis not present

## 2018-01-29 DIAGNOSIS — E119 Type 2 diabetes mellitus without complications: Secondary | ICD-10-CM | POA: Diagnosis not present

## 2018-01-29 DIAGNOSIS — I252 Old myocardial infarction: Secondary | ICD-10-CM | POA: Diagnosis not present

## 2018-01-29 DIAGNOSIS — J449 Chronic obstructive pulmonary disease, unspecified: Secondary | ICD-10-CM | POA: Diagnosis not present

## 2018-01-29 DIAGNOSIS — I255 Ischemic cardiomyopathy: Secondary | ICD-10-CM | POA: Diagnosis not present

## 2018-01-29 DIAGNOSIS — Z9181 History of falling: Secondary | ICD-10-CM | POA: Diagnosis not present

## 2018-01-29 DIAGNOSIS — Z951 Presence of aortocoronary bypass graft: Secondary | ICD-10-CM | POA: Diagnosis not present

## 2018-01-29 DIAGNOSIS — K279 Peptic ulcer, site unspecified, unspecified as acute or chronic, without hemorrhage or perforation: Secondary | ICD-10-CM | POA: Diagnosis not present

## 2018-01-29 DIAGNOSIS — I110 Hypertensive heart disease with heart failure: Secondary | ICD-10-CM | POA: Diagnosis not present

## 2018-01-29 DIAGNOSIS — M4802 Spinal stenosis, cervical region: Secondary | ICD-10-CM | POA: Diagnosis not present

## 2018-01-30 DIAGNOSIS — I255 Ischemic cardiomyopathy: Secondary | ICD-10-CM | POA: Diagnosis not present

## 2018-01-30 DIAGNOSIS — Z602 Problems related to living alone: Secondary | ICD-10-CM | POA: Diagnosis not present

## 2018-01-30 DIAGNOSIS — J449 Chronic obstructive pulmonary disease, unspecified: Secondary | ICD-10-CM | POA: Diagnosis not present

## 2018-01-30 DIAGNOSIS — Z79891 Long term (current) use of opiate analgesic: Secondary | ICD-10-CM | POA: Diagnosis not present

## 2018-01-30 DIAGNOSIS — E039 Hypothyroidism, unspecified: Secondary | ICD-10-CM | POA: Diagnosis not present

## 2018-01-30 DIAGNOSIS — M48061 Spinal stenosis, lumbar region without neurogenic claudication: Secondary | ICD-10-CM | POA: Diagnosis not present

## 2018-01-30 DIAGNOSIS — Z7982 Long term (current) use of aspirin: Secondary | ICD-10-CM | POA: Diagnosis not present

## 2018-01-30 DIAGNOSIS — I69351 Hemiplegia and hemiparesis following cerebral infarction affecting right dominant side: Secondary | ICD-10-CM | POA: Diagnosis not present

## 2018-01-30 DIAGNOSIS — K5730 Diverticulosis of large intestine without perforation or abscess without bleeding: Secondary | ICD-10-CM | POA: Diagnosis not present

## 2018-01-30 DIAGNOSIS — I2510 Atherosclerotic heart disease of native coronary artery without angina pectoris: Secondary | ICD-10-CM | POA: Diagnosis not present

## 2018-01-30 DIAGNOSIS — I110 Hypertensive heart disease with heart failure: Secondary | ICD-10-CM | POA: Diagnosis not present

## 2018-01-30 DIAGNOSIS — M810 Age-related osteoporosis without current pathological fracture: Secondary | ICD-10-CM | POA: Diagnosis not present

## 2018-01-30 DIAGNOSIS — K279 Peptic ulcer, site unspecified, unspecified as acute or chronic, without hemorrhage or perforation: Secondary | ICD-10-CM | POA: Diagnosis not present

## 2018-01-30 DIAGNOSIS — M4802 Spinal stenosis, cervical region: Secondary | ICD-10-CM | POA: Diagnosis not present

## 2018-01-30 DIAGNOSIS — Z9181 History of falling: Secondary | ICD-10-CM | POA: Diagnosis not present

## 2018-01-30 DIAGNOSIS — I252 Old myocardial infarction: Secondary | ICD-10-CM | POA: Diagnosis not present

## 2018-01-30 DIAGNOSIS — K449 Diaphragmatic hernia without obstruction or gangrene: Secondary | ICD-10-CM | POA: Diagnosis not present

## 2018-01-30 DIAGNOSIS — I509 Heart failure, unspecified: Secondary | ICD-10-CM | POA: Diagnosis not present

## 2018-01-30 DIAGNOSIS — E119 Type 2 diabetes mellitus without complications: Secondary | ICD-10-CM | POA: Diagnosis not present

## 2018-01-30 DIAGNOSIS — Z951 Presence of aortocoronary bypass graft: Secondary | ICD-10-CM | POA: Diagnosis not present

## 2018-02-03 DIAGNOSIS — I69351 Hemiplegia and hemiparesis following cerebral infarction affecting right dominant side: Secondary | ICD-10-CM | POA: Diagnosis not present

## 2018-02-03 DIAGNOSIS — K5730 Diverticulosis of large intestine without perforation or abscess without bleeding: Secondary | ICD-10-CM | POA: Diagnosis not present

## 2018-02-03 DIAGNOSIS — Z7982 Long term (current) use of aspirin: Secondary | ICD-10-CM | POA: Diagnosis not present

## 2018-02-03 DIAGNOSIS — Z79891 Long term (current) use of opiate analgesic: Secondary | ICD-10-CM | POA: Diagnosis not present

## 2018-02-03 DIAGNOSIS — Z9181 History of falling: Secondary | ICD-10-CM | POA: Diagnosis not present

## 2018-02-03 DIAGNOSIS — M4802 Spinal stenosis, cervical region: Secondary | ICD-10-CM | POA: Diagnosis not present

## 2018-02-03 DIAGNOSIS — Z951 Presence of aortocoronary bypass graft: Secondary | ICD-10-CM | POA: Diagnosis not present

## 2018-02-03 DIAGNOSIS — K279 Peptic ulcer, site unspecified, unspecified as acute or chronic, without hemorrhage or perforation: Secondary | ICD-10-CM | POA: Diagnosis not present

## 2018-02-03 DIAGNOSIS — I110 Hypertensive heart disease with heart failure: Secondary | ICD-10-CM | POA: Diagnosis not present

## 2018-02-03 DIAGNOSIS — K449 Diaphragmatic hernia without obstruction or gangrene: Secondary | ICD-10-CM | POA: Diagnosis not present

## 2018-02-03 DIAGNOSIS — I255 Ischemic cardiomyopathy: Secondary | ICD-10-CM | POA: Diagnosis not present

## 2018-02-03 DIAGNOSIS — J449 Chronic obstructive pulmonary disease, unspecified: Secondary | ICD-10-CM | POA: Diagnosis not present

## 2018-02-03 DIAGNOSIS — I252 Old myocardial infarction: Secondary | ICD-10-CM | POA: Diagnosis not present

## 2018-02-03 DIAGNOSIS — M48061 Spinal stenosis, lumbar region without neurogenic claudication: Secondary | ICD-10-CM | POA: Diagnosis not present

## 2018-02-03 DIAGNOSIS — I2510 Atherosclerotic heart disease of native coronary artery without angina pectoris: Secondary | ICD-10-CM | POA: Diagnosis not present

## 2018-02-03 DIAGNOSIS — M810 Age-related osteoporosis without current pathological fracture: Secondary | ICD-10-CM | POA: Diagnosis not present

## 2018-02-03 DIAGNOSIS — E119 Type 2 diabetes mellitus without complications: Secondary | ICD-10-CM | POA: Diagnosis not present

## 2018-02-03 DIAGNOSIS — I509 Heart failure, unspecified: Secondary | ICD-10-CM | POA: Diagnosis not present

## 2018-02-03 DIAGNOSIS — E039 Hypothyroidism, unspecified: Secondary | ICD-10-CM | POA: Diagnosis not present

## 2018-02-03 DIAGNOSIS — Z602 Problems related to living alone: Secondary | ICD-10-CM | POA: Diagnosis not present

## 2018-02-04 DIAGNOSIS — K279 Peptic ulcer, site unspecified, unspecified as acute or chronic, without hemorrhage or perforation: Secondary | ICD-10-CM | POA: Diagnosis not present

## 2018-02-04 DIAGNOSIS — M48061 Spinal stenosis, lumbar region without neurogenic claudication: Secondary | ICD-10-CM | POA: Diagnosis not present

## 2018-02-04 DIAGNOSIS — I69351 Hemiplegia and hemiparesis following cerebral infarction affecting right dominant side: Secondary | ICD-10-CM | POA: Diagnosis not present

## 2018-02-04 DIAGNOSIS — Z9181 History of falling: Secondary | ICD-10-CM | POA: Diagnosis not present

## 2018-02-04 DIAGNOSIS — I509 Heart failure, unspecified: Secondary | ICD-10-CM | POA: Diagnosis not present

## 2018-02-04 DIAGNOSIS — M810 Age-related osteoporosis without current pathological fracture: Secondary | ICD-10-CM | POA: Diagnosis not present

## 2018-02-04 DIAGNOSIS — Z602 Problems related to living alone: Secondary | ICD-10-CM | POA: Diagnosis not present

## 2018-02-04 DIAGNOSIS — K5730 Diverticulosis of large intestine without perforation or abscess without bleeding: Secondary | ICD-10-CM | POA: Diagnosis not present

## 2018-02-04 DIAGNOSIS — I255 Ischemic cardiomyopathy: Secondary | ICD-10-CM | POA: Diagnosis not present

## 2018-02-04 DIAGNOSIS — E119 Type 2 diabetes mellitus without complications: Secondary | ICD-10-CM | POA: Diagnosis not present

## 2018-02-04 DIAGNOSIS — I252 Old myocardial infarction: Secondary | ICD-10-CM | POA: Diagnosis not present

## 2018-02-04 DIAGNOSIS — I2510 Atherosclerotic heart disease of native coronary artery without angina pectoris: Secondary | ICD-10-CM | POA: Diagnosis not present

## 2018-02-04 DIAGNOSIS — I110 Hypertensive heart disease with heart failure: Secondary | ICD-10-CM | POA: Diagnosis not present

## 2018-02-04 DIAGNOSIS — Z7982 Long term (current) use of aspirin: Secondary | ICD-10-CM | POA: Diagnosis not present

## 2018-02-04 DIAGNOSIS — J449 Chronic obstructive pulmonary disease, unspecified: Secondary | ICD-10-CM | POA: Diagnosis not present

## 2018-02-04 DIAGNOSIS — Z951 Presence of aortocoronary bypass graft: Secondary | ICD-10-CM | POA: Diagnosis not present

## 2018-02-04 DIAGNOSIS — E039 Hypothyroidism, unspecified: Secondary | ICD-10-CM | POA: Diagnosis not present

## 2018-02-04 DIAGNOSIS — M4802 Spinal stenosis, cervical region: Secondary | ICD-10-CM | POA: Diagnosis not present

## 2018-02-04 DIAGNOSIS — Z79891 Long term (current) use of opiate analgesic: Secondary | ICD-10-CM | POA: Diagnosis not present

## 2018-02-04 DIAGNOSIS — K449 Diaphragmatic hernia without obstruction or gangrene: Secondary | ICD-10-CM | POA: Diagnosis not present

## 2018-02-05 DIAGNOSIS — Z951 Presence of aortocoronary bypass graft: Secondary | ICD-10-CM | POA: Diagnosis not present

## 2018-02-05 DIAGNOSIS — K279 Peptic ulcer, site unspecified, unspecified as acute or chronic, without hemorrhage or perforation: Secondary | ICD-10-CM | POA: Diagnosis not present

## 2018-02-05 DIAGNOSIS — K449 Diaphragmatic hernia without obstruction or gangrene: Secondary | ICD-10-CM | POA: Diagnosis not present

## 2018-02-05 DIAGNOSIS — I255 Ischemic cardiomyopathy: Secondary | ICD-10-CM | POA: Diagnosis not present

## 2018-02-05 DIAGNOSIS — E119 Type 2 diabetes mellitus without complications: Secondary | ICD-10-CM | POA: Diagnosis not present

## 2018-02-05 DIAGNOSIS — Z79891 Long term (current) use of opiate analgesic: Secondary | ICD-10-CM | POA: Diagnosis not present

## 2018-02-05 DIAGNOSIS — Z602 Problems related to living alone: Secondary | ICD-10-CM | POA: Diagnosis not present

## 2018-02-05 DIAGNOSIS — K5730 Diverticulosis of large intestine without perforation or abscess without bleeding: Secondary | ICD-10-CM | POA: Diagnosis not present

## 2018-02-05 DIAGNOSIS — Z9181 History of falling: Secondary | ICD-10-CM | POA: Diagnosis not present

## 2018-02-05 DIAGNOSIS — I69351 Hemiplegia and hemiparesis following cerebral infarction affecting right dominant side: Secondary | ICD-10-CM | POA: Diagnosis not present

## 2018-02-05 DIAGNOSIS — Z7982 Long term (current) use of aspirin: Secondary | ICD-10-CM | POA: Diagnosis not present

## 2018-02-05 DIAGNOSIS — M810 Age-related osteoporosis without current pathological fracture: Secondary | ICD-10-CM | POA: Diagnosis not present

## 2018-02-05 DIAGNOSIS — J449 Chronic obstructive pulmonary disease, unspecified: Secondary | ICD-10-CM | POA: Diagnosis not present

## 2018-02-05 DIAGNOSIS — E039 Hypothyroidism, unspecified: Secondary | ICD-10-CM | POA: Diagnosis not present

## 2018-02-05 DIAGNOSIS — I252 Old myocardial infarction: Secondary | ICD-10-CM | POA: Diagnosis not present

## 2018-02-05 DIAGNOSIS — M48061 Spinal stenosis, lumbar region without neurogenic claudication: Secondary | ICD-10-CM | POA: Diagnosis not present

## 2018-02-05 DIAGNOSIS — I110 Hypertensive heart disease with heart failure: Secondary | ICD-10-CM | POA: Diagnosis not present

## 2018-02-05 DIAGNOSIS — I2510 Atherosclerotic heart disease of native coronary artery without angina pectoris: Secondary | ICD-10-CM | POA: Diagnosis not present

## 2018-02-05 DIAGNOSIS — M4802 Spinal stenosis, cervical region: Secondary | ICD-10-CM | POA: Diagnosis not present

## 2018-02-05 DIAGNOSIS — I509 Heart failure, unspecified: Secondary | ICD-10-CM | POA: Diagnosis not present

## 2018-02-06 DIAGNOSIS — I255 Ischemic cardiomyopathy: Secondary | ICD-10-CM | POA: Diagnosis not present

## 2018-02-06 DIAGNOSIS — E039 Hypothyroidism, unspecified: Secondary | ICD-10-CM | POA: Diagnosis not present

## 2018-02-06 DIAGNOSIS — K449 Diaphragmatic hernia without obstruction or gangrene: Secondary | ICD-10-CM | POA: Diagnosis not present

## 2018-02-06 DIAGNOSIS — J449 Chronic obstructive pulmonary disease, unspecified: Secondary | ICD-10-CM | POA: Diagnosis not present

## 2018-02-06 DIAGNOSIS — M810 Age-related osteoporosis without current pathological fracture: Secondary | ICD-10-CM | POA: Diagnosis not present

## 2018-02-06 DIAGNOSIS — M48061 Spinal stenosis, lumbar region without neurogenic claudication: Secondary | ICD-10-CM | POA: Diagnosis not present

## 2018-02-06 DIAGNOSIS — Z79891 Long term (current) use of opiate analgesic: Secondary | ICD-10-CM | POA: Diagnosis not present

## 2018-02-06 DIAGNOSIS — E119 Type 2 diabetes mellitus without complications: Secondary | ICD-10-CM | POA: Diagnosis not present

## 2018-02-06 DIAGNOSIS — I2510 Atherosclerotic heart disease of native coronary artery without angina pectoris: Secondary | ICD-10-CM | POA: Diagnosis not present

## 2018-02-06 DIAGNOSIS — Z9181 History of falling: Secondary | ICD-10-CM | POA: Diagnosis not present

## 2018-02-06 DIAGNOSIS — K279 Peptic ulcer, site unspecified, unspecified as acute or chronic, without hemorrhage or perforation: Secondary | ICD-10-CM | POA: Diagnosis not present

## 2018-02-06 DIAGNOSIS — I252 Old myocardial infarction: Secondary | ICD-10-CM | POA: Diagnosis not present

## 2018-02-06 DIAGNOSIS — Z7982 Long term (current) use of aspirin: Secondary | ICD-10-CM | POA: Diagnosis not present

## 2018-02-06 DIAGNOSIS — Z602 Problems related to living alone: Secondary | ICD-10-CM | POA: Diagnosis not present

## 2018-02-06 DIAGNOSIS — K5730 Diverticulosis of large intestine without perforation or abscess without bleeding: Secondary | ICD-10-CM | POA: Diagnosis not present

## 2018-02-06 DIAGNOSIS — I509 Heart failure, unspecified: Secondary | ICD-10-CM | POA: Diagnosis not present

## 2018-02-06 DIAGNOSIS — I110 Hypertensive heart disease with heart failure: Secondary | ICD-10-CM | POA: Diagnosis not present

## 2018-02-06 DIAGNOSIS — M4802 Spinal stenosis, cervical region: Secondary | ICD-10-CM | POA: Diagnosis not present

## 2018-02-06 DIAGNOSIS — I69351 Hemiplegia and hemiparesis following cerebral infarction affecting right dominant side: Secondary | ICD-10-CM | POA: Diagnosis not present

## 2018-02-06 DIAGNOSIS — Z951 Presence of aortocoronary bypass graft: Secondary | ICD-10-CM | POA: Diagnosis not present

## 2018-02-07 DIAGNOSIS — S4992XA Unspecified injury of left shoulder and upper arm, initial encounter: Secondary | ICD-10-CM | POA: Diagnosis not present

## 2018-02-10 DIAGNOSIS — K449 Diaphragmatic hernia without obstruction or gangrene: Secondary | ICD-10-CM | POA: Diagnosis not present

## 2018-02-10 DIAGNOSIS — Z951 Presence of aortocoronary bypass graft: Secondary | ICD-10-CM | POA: Diagnosis not present

## 2018-02-10 DIAGNOSIS — M810 Age-related osteoporosis without current pathological fracture: Secondary | ICD-10-CM | POA: Diagnosis not present

## 2018-02-10 DIAGNOSIS — E119 Type 2 diabetes mellitus without complications: Secondary | ICD-10-CM | POA: Diagnosis not present

## 2018-02-10 DIAGNOSIS — I69351 Hemiplegia and hemiparesis following cerebral infarction affecting right dominant side: Secondary | ICD-10-CM | POA: Diagnosis not present

## 2018-02-10 DIAGNOSIS — I2510 Atherosclerotic heart disease of native coronary artery without angina pectoris: Secondary | ICD-10-CM | POA: Diagnosis not present

## 2018-02-10 DIAGNOSIS — K5730 Diverticulosis of large intestine without perforation or abscess without bleeding: Secondary | ICD-10-CM | POA: Diagnosis not present

## 2018-02-10 DIAGNOSIS — I509 Heart failure, unspecified: Secondary | ICD-10-CM | POA: Diagnosis not present

## 2018-02-10 DIAGNOSIS — Z79891 Long term (current) use of opiate analgesic: Secondary | ICD-10-CM | POA: Diagnosis not present

## 2018-02-10 DIAGNOSIS — K279 Peptic ulcer, site unspecified, unspecified as acute or chronic, without hemorrhage or perforation: Secondary | ICD-10-CM | POA: Diagnosis not present

## 2018-02-10 DIAGNOSIS — I110 Hypertensive heart disease with heart failure: Secondary | ICD-10-CM | POA: Diagnosis not present

## 2018-02-10 DIAGNOSIS — Z9181 History of falling: Secondary | ICD-10-CM | POA: Diagnosis not present

## 2018-02-10 DIAGNOSIS — M48061 Spinal stenosis, lumbar region without neurogenic claudication: Secondary | ICD-10-CM | POA: Diagnosis not present

## 2018-02-10 DIAGNOSIS — I255 Ischemic cardiomyopathy: Secondary | ICD-10-CM | POA: Diagnosis not present

## 2018-02-10 DIAGNOSIS — J449 Chronic obstructive pulmonary disease, unspecified: Secondary | ICD-10-CM | POA: Diagnosis not present

## 2018-02-10 DIAGNOSIS — E039 Hypothyroidism, unspecified: Secondary | ICD-10-CM | POA: Diagnosis not present

## 2018-02-10 DIAGNOSIS — Z602 Problems related to living alone: Secondary | ICD-10-CM | POA: Diagnosis not present

## 2018-02-10 DIAGNOSIS — I252 Old myocardial infarction: Secondary | ICD-10-CM | POA: Diagnosis not present

## 2018-02-10 DIAGNOSIS — M4802 Spinal stenosis, cervical region: Secondary | ICD-10-CM | POA: Diagnosis not present

## 2018-02-10 DIAGNOSIS — Z7982 Long term (current) use of aspirin: Secondary | ICD-10-CM | POA: Diagnosis not present

## 2018-02-13 DIAGNOSIS — I255 Ischemic cardiomyopathy: Secondary | ICD-10-CM | POA: Diagnosis not present

## 2018-02-13 DIAGNOSIS — Z951 Presence of aortocoronary bypass graft: Secondary | ICD-10-CM | POA: Diagnosis not present

## 2018-02-13 DIAGNOSIS — I2510 Atherosclerotic heart disease of native coronary artery without angina pectoris: Secondary | ICD-10-CM | POA: Diagnosis not present

## 2018-02-13 DIAGNOSIS — M810 Age-related osteoporosis without current pathological fracture: Secondary | ICD-10-CM | POA: Diagnosis not present

## 2018-02-13 DIAGNOSIS — K5730 Diverticulosis of large intestine without perforation or abscess without bleeding: Secondary | ICD-10-CM | POA: Diagnosis not present

## 2018-02-13 DIAGNOSIS — Z9181 History of falling: Secondary | ICD-10-CM | POA: Diagnosis not present

## 2018-02-13 DIAGNOSIS — Z79891 Long term (current) use of opiate analgesic: Secondary | ICD-10-CM | POA: Diagnosis not present

## 2018-02-13 DIAGNOSIS — Z7982 Long term (current) use of aspirin: Secondary | ICD-10-CM | POA: Diagnosis not present

## 2018-02-13 DIAGNOSIS — E119 Type 2 diabetes mellitus without complications: Secondary | ICD-10-CM | POA: Diagnosis not present

## 2018-02-13 DIAGNOSIS — I110 Hypertensive heart disease with heart failure: Secondary | ICD-10-CM | POA: Diagnosis not present

## 2018-02-13 DIAGNOSIS — I252 Old myocardial infarction: Secondary | ICD-10-CM | POA: Diagnosis not present

## 2018-02-13 DIAGNOSIS — K449 Diaphragmatic hernia without obstruction or gangrene: Secondary | ICD-10-CM | POA: Diagnosis not present

## 2018-02-13 DIAGNOSIS — M48061 Spinal stenosis, lumbar region without neurogenic claudication: Secondary | ICD-10-CM | POA: Diagnosis not present

## 2018-02-13 DIAGNOSIS — Z602 Problems related to living alone: Secondary | ICD-10-CM | POA: Diagnosis not present

## 2018-02-13 DIAGNOSIS — I69351 Hemiplegia and hemiparesis following cerebral infarction affecting right dominant side: Secondary | ICD-10-CM | POA: Diagnosis not present

## 2018-02-13 DIAGNOSIS — M4802 Spinal stenosis, cervical region: Secondary | ICD-10-CM | POA: Diagnosis not present

## 2018-02-13 DIAGNOSIS — E039 Hypothyroidism, unspecified: Secondary | ICD-10-CM | POA: Diagnosis not present

## 2018-02-13 DIAGNOSIS — K279 Peptic ulcer, site unspecified, unspecified as acute or chronic, without hemorrhage or perforation: Secondary | ICD-10-CM | POA: Diagnosis not present

## 2018-02-13 DIAGNOSIS — I509 Heart failure, unspecified: Secondary | ICD-10-CM | POA: Diagnosis not present

## 2018-02-13 DIAGNOSIS — J449 Chronic obstructive pulmonary disease, unspecified: Secondary | ICD-10-CM | POA: Diagnosis not present

## 2018-02-17 DIAGNOSIS — M25512 Pain in left shoulder: Secondary | ICD-10-CM | POA: Diagnosis not present

## 2018-02-18 DIAGNOSIS — I69351 Hemiplegia and hemiparesis following cerebral infarction affecting right dominant side: Secondary | ICD-10-CM | POA: Diagnosis not present

## 2018-02-18 DIAGNOSIS — Z79891 Long term (current) use of opiate analgesic: Secondary | ICD-10-CM | POA: Diagnosis not present

## 2018-02-18 DIAGNOSIS — K5730 Diverticulosis of large intestine without perforation or abscess without bleeding: Secondary | ICD-10-CM | POA: Diagnosis not present

## 2018-02-18 DIAGNOSIS — K449 Diaphragmatic hernia without obstruction or gangrene: Secondary | ICD-10-CM | POA: Diagnosis not present

## 2018-02-18 DIAGNOSIS — I110 Hypertensive heart disease with heart failure: Secondary | ICD-10-CM | POA: Diagnosis not present

## 2018-02-18 DIAGNOSIS — I255 Ischemic cardiomyopathy: Secondary | ICD-10-CM | POA: Diagnosis not present

## 2018-02-18 DIAGNOSIS — Z9181 History of falling: Secondary | ICD-10-CM | POA: Diagnosis not present

## 2018-02-18 DIAGNOSIS — I2510 Atherosclerotic heart disease of native coronary artery without angina pectoris: Secondary | ICD-10-CM | POA: Diagnosis not present

## 2018-02-18 DIAGNOSIS — I509 Heart failure, unspecified: Secondary | ICD-10-CM | POA: Diagnosis not present

## 2018-02-18 DIAGNOSIS — E119 Type 2 diabetes mellitus without complications: Secondary | ICD-10-CM | POA: Diagnosis not present

## 2018-02-18 DIAGNOSIS — Z602 Problems related to living alone: Secondary | ICD-10-CM | POA: Diagnosis not present

## 2018-02-18 DIAGNOSIS — Z951 Presence of aortocoronary bypass graft: Secondary | ICD-10-CM | POA: Diagnosis not present

## 2018-02-18 DIAGNOSIS — I252 Old myocardial infarction: Secondary | ICD-10-CM | POA: Diagnosis not present

## 2018-02-18 DIAGNOSIS — J449 Chronic obstructive pulmonary disease, unspecified: Secondary | ICD-10-CM | POA: Diagnosis not present

## 2018-02-18 DIAGNOSIS — K279 Peptic ulcer, site unspecified, unspecified as acute or chronic, without hemorrhage or perforation: Secondary | ICD-10-CM | POA: Diagnosis not present

## 2018-02-18 DIAGNOSIS — M4802 Spinal stenosis, cervical region: Secondary | ICD-10-CM | POA: Diagnosis not present

## 2018-02-18 DIAGNOSIS — M810 Age-related osteoporosis without current pathological fracture: Secondary | ICD-10-CM | POA: Diagnosis not present

## 2018-02-18 DIAGNOSIS — M48061 Spinal stenosis, lumbar region without neurogenic claudication: Secondary | ICD-10-CM | POA: Diagnosis not present

## 2018-02-18 DIAGNOSIS — Z7982 Long term (current) use of aspirin: Secondary | ICD-10-CM | POA: Diagnosis not present

## 2018-02-18 DIAGNOSIS — E039 Hypothyroidism, unspecified: Secondary | ICD-10-CM | POA: Diagnosis not present

## 2018-02-21 DIAGNOSIS — J449 Chronic obstructive pulmonary disease, unspecified: Secondary | ICD-10-CM | POA: Diagnosis not present

## 2018-02-21 DIAGNOSIS — I509 Heart failure, unspecified: Secondary | ICD-10-CM | POA: Diagnosis not present

## 2018-02-21 DIAGNOSIS — I252 Old myocardial infarction: Secondary | ICD-10-CM | POA: Diagnosis not present

## 2018-02-21 DIAGNOSIS — Z602 Problems related to living alone: Secondary | ICD-10-CM | POA: Diagnosis not present

## 2018-02-21 DIAGNOSIS — K279 Peptic ulcer, site unspecified, unspecified as acute or chronic, without hemorrhage or perforation: Secondary | ICD-10-CM | POA: Diagnosis not present

## 2018-02-21 DIAGNOSIS — I69351 Hemiplegia and hemiparesis following cerebral infarction affecting right dominant side: Secondary | ICD-10-CM | POA: Diagnosis not present

## 2018-02-21 DIAGNOSIS — I255 Ischemic cardiomyopathy: Secondary | ICD-10-CM | POA: Diagnosis not present

## 2018-02-21 DIAGNOSIS — E039 Hypothyroidism, unspecified: Secondary | ICD-10-CM | POA: Diagnosis not present

## 2018-02-21 DIAGNOSIS — M48061 Spinal stenosis, lumbar region without neurogenic claudication: Secondary | ICD-10-CM | POA: Diagnosis not present

## 2018-02-21 DIAGNOSIS — Z79891 Long term (current) use of opiate analgesic: Secondary | ICD-10-CM | POA: Diagnosis not present

## 2018-02-21 DIAGNOSIS — Z7982 Long term (current) use of aspirin: Secondary | ICD-10-CM | POA: Diagnosis not present

## 2018-02-21 DIAGNOSIS — E119 Type 2 diabetes mellitus without complications: Secondary | ICD-10-CM | POA: Diagnosis not present

## 2018-02-21 DIAGNOSIS — I2510 Atherosclerotic heart disease of native coronary artery without angina pectoris: Secondary | ICD-10-CM | POA: Diagnosis not present

## 2018-02-21 DIAGNOSIS — Z9181 History of falling: Secondary | ICD-10-CM | POA: Diagnosis not present

## 2018-02-21 DIAGNOSIS — M4802 Spinal stenosis, cervical region: Secondary | ICD-10-CM | POA: Diagnosis not present

## 2018-02-21 DIAGNOSIS — K449 Diaphragmatic hernia without obstruction or gangrene: Secondary | ICD-10-CM | POA: Diagnosis not present

## 2018-02-21 DIAGNOSIS — Z951 Presence of aortocoronary bypass graft: Secondary | ICD-10-CM | POA: Diagnosis not present

## 2018-02-21 DIAGNOSIS — M810 Age-related osteoporosis without current pathological fracture: Secondary | ICD-10-CM | POA: Diagnosis not present

## 2018-02-21 DIAGNOSIS — K5730 Diverticulosis of large intestine without perforation or abscess without bleeding: Secondary | ICD-10-CM | POA: Diagnosis not present

## 2018-02-21 DIAGNOSIS — I110 Hypertensive heart disease with heart failure: Secondary | ICD-10-CM | POA: Diagnosis not present

## 2018-03-03 DIAGNOSIS — Z602 Problems related to living alone: Secondary | ICD-10-CM | POA: Diagnosis not present

## 2018-03-03 DIAGNOSIS — I255 Ischemic cardiomyopathy: Secondary | ICD-10-CM | POA: Diagnosis not present

## 2018-03-03 DIAGNOSIS — E039 Hypothyroidism, unspecified: Secondary | ICD-10-CM | POA: Diagnosis not present

## 2018-03-03 DIAGNOSIS — I69351 Hemiplegia and hemiparesis following cerebral infarction affecting right dominant side: Secondary | ICD-10-CM | POA: Diagnosis not present

## 2018-03-03 DIAGNOSIS — M48061 Spinal stenosis, lumbar region without neurogenic claudication: Secondary | ICD-10-CM | POA: Diagnosis not present

## 2018-03-03 DIAGNOSIS — M4802 Spinal stenosis, cervical region: Secondary | ICD-10-CM | POA: Diagnosis not present

## 2018-03-03 DIAGNOSIS — K279 Peptic ulcer, site unspecified, unspecified as acute or chronic, without hemorrhage or perforation: Secondary | ICD-10-CM | POA: Diagnosis not present

## 2018-03-03 DIAGNOSIS — J449 Chronic obstructive pulmonary disease, unspecified: Secondary | ICD-10-CM | POA: Diagnosis not present

## 2018-03-03 DIAGNOSIS — I2510 Atherosclerotic heart disease of native coronary artery without angina pectoris: Secondary | ICD-10-CM | POA: Diagnosis not present

## 2018-03-03 DIAGNOSIS — M810 Age-related osteoporosis without current pathological fracture: Secondary | ICD-10-CM | POA: Diagnosis not present

## 2018-03-03 DIAGNOSIS — Z79891 Long term (current) use of opiate analgesic: Secondary | ICD-10-CM | POA: Diagnosis not present

## 2018-03-03 DIAGNOSIS — I509 Heart failure, unspecified: Secondary | ICD-10-CM | POA: Diagnosis not present

## 2018-03-03 DIAGNOSIS — I110 Hypertensive heart disease with heart failure: Secondary | ICD-10-CM | POA: Diagnosis not present

## 2018-03-03 DIAGNOSIS — K5730 Diverticulosis of large intestine without perforation or abscess without bleeding: Secondary | ICD-10-CM | POA: Diagnosis not present

## 2018-03-03 DIAGNOSIS — Z9181 History of falling: Secondary | ICD-10-CM | POA: Diagnosis not present

## 2018-03-03 DIAGNOSIS — I252 Old myocardial infarction: Secondary | ICD-10-CM | POA: Diagnosis not present

## 2018-03-03 DIAGNOSIS — Z951 Presence of aortocoronary bypass graft: Secondary | ICD-10-CM | POA: Diagnosis not present

## 2018-03-03 DIAGNOSIS — E119 Type 2 diabetes mellitus without complications: Secondary | ICD-10-CM | POA: Diagnosis not present

## 2018-03-03 DIAGNOSIS — Z7982 Long term (current) use of aspirin: Secondary | ICD-10-CM | POA: Diagnosis not present

## 2018-03-03 DIAGNOSIS — K449 Diaphragmatic hernia without obstruction or gangrene: Secondary | ICD-10-CM | POA: Diagnosis not present

## 2018-03-06 DIAGNOSIS — K449 Diaphragmatic hernia without obstruction or gangrene: Secondary | ICD-10-CM | POA: Diagnosis not present

## 2018-03-06 DIAGNOSIS — M4802 Spinal stenosis, cervical region: Secondary | ICD-10-CM | POA: Diagnosis not present

## 2018-03-06 DIAGNOSIS — Z79891 Long term (current) use of opiate analgesic: Secondary | ICD-10-CM | POA: Diagnosis not present

## 2018-03-06 DIAGNOSIS — I2510 Atherosclerotic heart disease of native coronary artery without angina pectoris: Secondary | ICD-10-CM | POA: Diagnosis not present

## 2018-03-06 DIAGNOSIS — E039 Hypothyroidism, unspecified: Secondary | ICD-10-CM | POA: Diagnosis not present

## 2018-03-06 DIAGNOSIS — M48061 Spinal stenosis, lumbar region without neurogenic claudication: Secondary | ICD-10-CM | POA: Diagnosis not present

## 2018-03-06 DIAGNOSIS — K5730 Diverticulosis of large intestine without perforation or abscess without bleeding: Secondary | ICD-10-CM | POA: Diagnosis not present

## 2018-03-06 DIAGNOSIS — Z9181 History of falling: Secondary | ICD-10-CM | POA: Diagnosis not present

## 2018-03-06 DIAGNOSIS — E119 Type 2 diabetes mellitus without complications: Secondary | ICD-10-CM | POA: Diagnosis not present

## 2018-03-06 DIAGNOSIS — I252 Old myocardial infarction: Secondary | ICD-10-CM | POA: Diagnosis not present

## 2018-03-06 DIAGNOSIS — Z951 Presence of aortocoronary bypass graft: Secondary | ICD-10-CM | POA: Diagnosis not present

## 2018-03-06 DIAGNOSIS — J449 Chronic obstructive pulmonary disease, unspecified: Secondary | ICD-10-CM | POA: Diagnosis not present

## 2018-03-06 DIAGNOSIS — Z7982 Long term (current) use of aspirin: Secondary | ICD-10-CM | POA: Diagnosis not present

## 2018-03-06 DIAGNOSIS — Z602 Problems related to living alone: Secondary | ICD-10-CM | POA: Diagnosis not present

## 2018-03-06 DIAGNOSIS — I255 Ischemic cardiomyopathy: Secondary | ICD-10-CM | POA: Diagnosis not present

## 2018-03-06 DIAGNOSIS — I110 Hypertensive heart disease with heart failure: Secondary | ICD-10-CM | POA: Diagnosis not present

## 2018-03-06 DIAGNOSIS — I69351 Hemiplegia and hemiparesis following cerebral infarction affecting right dominant side: Secondary | ICD-10-CM | POA: Diagnosis not present

## 2018-03-06 DIAGNOSIS — I509 Heart failure, unspecified: Secondary | ICD-10-CM | POA: Diagnosis not present

## 2018-03-06 DIAGNOSIS — K279 Peptic ulcer, site unspecified, unspecified as acute or chronic, without hemorrhage or perforation: Secondary | ICD-10-CM | POA: Diagnosis not present

## 2018-03-06 DIAGNOSIS — M810 Age-related osteoporosis without current pathological fracture: Secondary | ICD-10-CM | POA: Diagnosis not present

## 2018-03-22 DIAGNOSIS — M25559 Pain in unspecified hip: Secondary | ICD-10-CM | POA: Diagnosis not present

## 2018-03-26 DIAGNOSIS — M19019 Primary osteoarthritis, unspecified shoulder: Secondary | ICD-10-CM | POA: Diagnosis not present

## 2018-03-26 DIAGNOSIS — M12819 Other specific arthropathies, not elsewhere classified, unspecified shoulder: Secondary | ICD-10-CM | POA: Diagnosis not present

## 2018-03-26 DIAGNOSIS — M75100 Unspecified rotator cuff tear or rupture of unspecified shoulder, not specified as traumatic: Secondary | ICD-10-CM | POA: Diagnosis not present

## 2018-04-29 DIAGNOSIS — E871 Hypo-osmolality and hyponatremia: Secondary | ICD-10-CM | POA: Diagnosis not present

## 2018-04-29 DIAGNOSIS — R0602 Shortness of breath: Secondary | ICD-10-CM | POA: Diagnosis not present

## 2018-04-29 DIAGNOSIS — R0609 Other forms of dyspnea: Secondary | ICD-10-CM | POA: Diagnosis not present

## 2018-04-29 DIAGNOSIS — R5383 Other fatigue: Secondary | ICD-10-CM | POA: Diagnosis not present

## 2018-04-29 DIAGNOSIS — I499 Cardiac arrhythmia, unspecified: Secondary | ICD-10-CM | POA: Diagnosis not present

## 2018-04-29 DIAGNOSIS — I10 Essential (primary) hypertension: Secondary | ICD-10-CM | POA: Diagnosis not present

## 2018-05-01 DIAGNOSIS — Z741 Need for assistance with personal care: Secondary | ICD-10-CM | POA: Diagnosis not present

## 2018-05-01 DIAGNOSIS — I739 Peripheral vascular disease, unspecified: Secondary | ICD-10-CM | POA: Diagnosis not present

## 2018-05-01 DIAGNOSIS — Z951 Presence of aortocoronary bypass graft: Secondary | ICD-10-CM | POA: Diagnosis not present

## 2018-05-01 DIAGNOSIS — M1990 Unspecified osteoarthritis, unspecified site: Secondary | ICD-10-CM | POA: Diagnosis not present

## 2018-05-01 DIAGNOSIS — R7989 Other specified abnormal findings of blood chemistry: Secondary | ICD-10-CM | POA: Diagnosis not present

## 2018-05-01 DIAGNOSIS — Z8673 Personal history of transient ischemic attack (TIA), and cerebral infarction without residual deficits: Secondary | ICD-10-CM | POA: Diagnosis not present

## 2018-05-01 DIAGNOSIS — I25810 Atherosclerosis of coronary artery bypass graft(s) without angina pectoris: Secondary | ICD-10-CM | POA: Diagnosis not present

## 2018-05-01 DIAGNOSIS — Z888 Allergy status to other drugs, medicaments and biological substances status: Secondary | ICD-10-CM | POA: Diagnosis not present

## 2018-05-01 DIAGNOSIS — M810 Age-related osteoporosis without current pathological fracture: Secondary | ICD-10-CM | POA: Diagnosis not present

## 2018-05-01 DIAGNOSIS — E860 Dehydration: Secondary | ICD-10-CM | POA: Diagnosis not present

## 2018-05-01 DIAGNOSIS — R0602 Shortness of breath: Secondary | ICD-10-CM | POA: Diagnosis not present

## 2018-05-01 DIAGNOSIS — I160 Hypertensive urgency: Secondary | ICD-10-CM | POA: Diagnosis not present

## 2018-05-01 DIAGNOSIS — Z880 Allergy status to penicillin: Secondary | ICD-10-CM | POA: Diagnosis not present

## 2018-05-01 DIAGNOSIS — K5790 Diverticulosis of intestine, part unspecified, without perforation or abscess without bleeding: Secondary | ICD-10-CM | POA: Diagnosis not present

## 2018-05-01 DIAGNOSIS — E7800 Pure hypercholesterolemia, unspecified: Secondary | ICD-10-CM | POA: Diagnosis not present

## 2018-05-01 DIAGNOSIS — E039 Hypothyroidism, unspecified: Secondary | ICD-10-CM | POA: Diagnosis not present

## 2018-05-01 DIAGNOSIS — E871 Hypo-osmolality and hyponatremia: Secondary | ICD-10-CM | POA: Diagnosis not present

## 2018-05-01 DIAGNOSIS — K219 Gastro-esophageal reflux disease without esophagitis: Secondary | ICD-10-CM | POA: Diagnosis not present

## 2018-05-01 DIAGNOSIS — Z7982 Long term (current) use of aspirin: Secondary | ICD-10-CM | POA: Diagnosis not present

## 2018-05-01 DIAGNOSIS — I2510 Atherosclerotic heart disease of native coronary artery without angina pectoris: Secondary | ICD-10-CM | POA: Diagnosis not present

## 2018-05-01 DIAGNOSIS — R531 Weakness: Secondary | ICD-10-CM | POA: Diagnosis not present

## 2018-05-01 DIAGNOSIS — I10 Essential (primary) hypertension: Secondary | ICD-10-CM | POA: Diagnosis not present

## 2018-05-01 DIAGNOSIS — R296 Repeated falls: Secondary | ICD-10-CM | POA: Diagnosis not present

## 2018-05-01 DIAGNOSIS — M8588 Other specified disorders of bone density and structure, other site: Secondary | ICD-10-CM | POA: Diagnosis not present

## 2018-05-01 DIAGNOSIS — Z79899 Other long term (current) drug therapy: Secondary | ICD-10-CM | POA: Diagnosis not present

## 2018-05-01 DIAGNOSIS — D649 Anemia, unspecified: Secondary | ICD-10-CM | POA: Diagnosis not present

## 2018-05-01 DIAGNOSIS — I252 Old myocardial infarction: Secondary | ICD-10-CM | POA: Diagnosis not present

## 2018-05-01 DIAGNOSIS — Z9181 History of falling: Secondary | ICD-10-CM | POA: Diagnosis not present

## 2018-05-01 DIAGNOSIS — E785 Hyperlipidemia, unspecified: Secondary | ICD-10-CM | POA: Diagnosis not present

## 2018-05-02 DIAGNOSIS — I10 Essential (primary) hypertension: Secondary | ICD-10-CM | POA: Diagnosis not present

## 2018-05-02 DIAGNOSIS — E860 Dehydration: Secondary | ICD-10-CM | POA: Diagnosis not present

## 2018-05-02 DIAGNOSIS — E785 Hyperlipidemia, unspecified: Secondary | ICD-10-CM | POA: Diagnosis not present

## 2018-05-02 DIAGNOSIS — I739 Peripheral vascular disease, unspecified: Secondary | ICD-10-CM | POA: Diagnosis not present

## 2018-05-02 DIAGNOSIS — R7989 Other specified abnormal findings of blood chemistry: Secondary | ICD-10-CM | POA: Diagnosis not present

## 2018-05-02 DIAGNOSIS — I2510 Atherosclerotic heart disease of native coronary artery without angina pectoris: Secondary | ICD-10-CM | POA: Diagnosis not present

## 2018-05-02 DIAGNOSIS — K219 Gastro-esophageal reflux disease without esophagitis: Secondary | ICD-10-CM | POA: Diagnosis not present

## 2018-05-02 DIAGNOSIS — Z741 Need for assistance with personal care: Secondary | ICD-10-CM | POA: Diagnosis not present

## 2018-05-02 DIAGNOSIS — R0602 Shortness of breath: Secondary | ICD-10-CM | POA: Diagnosis not present

## 2018-05-02 DIAGNOSIS — M8588 Other specified disorders of bone density and structure, other site: Secondary | ICD-10-CM | POA: Diagnosis not present

## 2018-05-02 DIAGNOSIS — R296 Repeated falls: Secondary | ICD-10-CM | POA: Diagnosis not present

## 2018-05-02 DIAGNOSIS — D649 Anemia, unspecified: Secondary | ICD-10-CM | POA: Diagnosis not present

## 2018-05-02 DIAGNOSIS — R262 Difficulty in walking, not elsewhere classified: Secondary | ICD-10-CM | POA: Diagnosis not present

## 2018-05-02 DIAGNOSIS — K5790 Diverticulosis of intestine, part unspecified, without perforation or abscess without bleeding: Secondary | ICD-10-CM | POA: Diagnosis not present

## 2018-05-02 DIAGNOSIS — E871 Hypo-osmolality and hyponatremia: Secondary | ICD-10-CM | POA: Diagnosis not present

## 2018-05-02 DIAGNOSIS — M810 Age-related osteoporosis without current pathological fracture: Secondary | ICD-10-CM | POA: Diagnosis not present

## 2018-05-02 DIAGNOSIS — I25810 Atherosclerosis of coronary artery bypass graft(s) without angina pectoris: Secondary | ICD-10-CM | POA: Diagnosis not present

## 2018-05-02 DIAGNOSIS — R531 Weakness: Secondary | ICD-10-CM | POA: Diagnosis not present

## 2018-05-05 DIAGNOSIS — R262 Difficulty in walking, not elsewhere classified: Secondary | ICD-10-CM | POA: Diagnosis not present

## 2018-05-19 DIAGNOSIS — I69351 Hemiplegia and hemiparesis following cerebral infarction affecting right dominant side: Secondary | ICD-10-CM | POA: Diagnosis not present

## 2018-05-19 DIAGNOSIS — Z602 Problems related to living alone: Secondary | ICD-10-CM | POA: Diagnosis not present

## 2018-05-19 DIAGNOSIS — Z951 Presence of aortocoronary bypass graft: Secondary | ICD-10-CM | POA: Diagnosis not present

## 2018-05-19 DIAGNOSIS — M48061 Spinal stenosis, lumbar region without neurogenic claudication: Secondary | ICD-10-CM | POA: Diagnosis not present

## 2018-05-19 DIAGNOSIS — K5730 Diverticulosis of large intestine without perforation or abscess without bleeding: Secondary | ICD-10-CM | POA: Diagnosis not present

## 2018-05-19 DIAGNOSIS — M4802 Spinal stenosis, cervical region: Secondary | ICD-10-CM | POA: Diagnosis not present

## 2018-05-19 DIAGNOSIS — Z9181 History of falling: Secondary | ICD-10-CM | POA: Diagnosis not present

## 2018-05-19 DIAGNOSIS — K279 Peptic ulcer, site unspecified, unspecified as acute or chronic, without hemorrhage or perforation: Secondary | ICD-10-CM | POA: Diagnosis not present

## 2018-05-19 DIAGNOSIS — M810 Age-related osteoporosis without current pathological fracture: Secondary | ICD-10-CM | POA: Diagnosis not present

## 2018-05-19 DIAGNOSIS — Z7982 Long term (current) use of aspirin: Secondary | ICD-10-CM | POA: Diagnosis not present

## 2018-05-19 DIAGNOSIS — E1151 Type 2 diabetes mellitus with diabetic peripheral angiopathy without gangrene: Secondary | ICD-10-CM | POA: Diagnosis not present

## 2018-05-19 DIAGNOSIS — K449 Diaphragmatic hernia without obstruction or gangrene: Secondary | ICD-10-CM | POA: Diagnosis not present

## 2018-05-19 DIAGNOSIS — I110 Hypertensive heart disease with heart failure: Secondary | ICD-10-CM | POA: Diagnosis not present

## 2018-05-19 DIAGNOSIS — J449 Chronic obstructive pulmonary disease, unspecified: Secondary | ICD-10-CM | POA: Diagnosis not present

## 2018-05-19 DIAGNOSIS — I2510 Atherosclerotic heart disease of native coronary artery without angina pectoris: Secondary | ICD-10-CM | POA: Diagnosis not present

## 2018-05-19 DIAGNOSIS — I509 Heart failure, unspecified: Secondary | ICD-10-CM | POA: Diagnosis not present

## 2018-05-19 DIAGNOSIS — E871 Hypo-osmolality and hyponatremia: Secondary | ICD-10-CM | POA: Diagnosis not present

## 2018-05-19 DIAGNOSIS — E039 Hypothyroidism, unspecified: Secondary | ICD-10-CM | POA: Diagnosis not present

## 2018-05-19 DIAGNOSIS — Z79891 Long term (current) use of opiate analgesic: Secondary | ICD-10-CM | POA: Diagnosis not present

## 2018-05-19 DIAGNOSIS — I255 Ischemic cardiomyopathy: Secondary | ICD-10-CM | POA: Diagnosis not present

## 2018-05-19 DIAGNOSIS — I252 Old myocardial infarction: Secondary | ICD-10-CM | POA: Diagnosis not present

## 2018-05-20 DIAGNOSIS — I110 Hypertensive heart disease with heart failure: Secondary | ICD-10-CM | POA: Diagnosis not present

## 2018-05-20 DIAGNOSIS — J449 Chronic obstructive pulmonary disease, unspecified: Secondary | ICD-10-CM | POA: Diagnosis not present

## 2018-05-20 DIAGNOSIS — K449 Diaphragmatic hernia without obstruction or gangrene: Secondary | ICD-10-CM | POA: Diagnosis not present

## 2018-05-20 DIAGNOSIS — E1151 Type 2 diabetes mellitus with diabetic peripheral angiopathy without gangrene: Secondary | ICD-10-CM | POA: Diagnosis not present

## 2018-05-20 DIAGNOSIS — K5730 Diverticulosis of large intestine without perforation or abscess without bleeding: Secondary | ICD-10-CM | POA: Diagnosis not present

## 2018-05-20 DIAGNOSIS — K279 Peptic ulcer, site unspecified, unspecified as acute or chronic, without hemorrhage or perforation: Secondary | ICD-10-CM | POA: Diagnosis not present

## 2018-05-20 DIAGNOSIS — I252 Old myocardial infarction: Secondary | ICD-10-CM | POA: Diagnosis not present

## 2018-05-20 DIAGNOSIS — E039 Hypothyroidism, unspecified: Secondary | ICD-10-CM | POA: Diagnosis not present

## 2018-05-20 DIAGNOSIS — I2510 Atherosclerotic heart disease of native coronary artery without angina pectoris: Secondary | ICD-10-CM | POA: Diagnosis not present

## 2018-05-20 DIAGNOSIS — M810 Age-related osteoporosis without current pathological fracture: Secondary | ICD-10-CM | POA: Diagnosis not present

## 2018-05-20 DIAGNOSIS — M48061 Spinal stenosis, lumbar region without neurogenic claudication: Secondary | ICD-10-CM | POA: Diagnosis not present

## 2018-05-20 DIAGNOSIS — Z9181 History of falling: Secondary | ICD-10-CM | POA: Diagnosis not present

## 2018-05-20 DIAGNOSIS — Z7982 Long term (current) use of aspirin: Secondary | ICD-10-CM | POA: Diagnosis not present

## 2018-05-20 DIAGNOSIS — E871 Hypo-osmolality and hyponatremia: Secondary | ICD-10-CM | POA: Diagnosis not present

## 2018-05-20 DIAGNOSIS — I509 Heart failure, unspecified: Secondary | ICD-10-CM | POA: Diagnosis not present

## 2018-05-20 DIAGNOSIS — I255 Ischemic cardiomyopathy: Secondary | ICD-10-CM | POA: Diagnosis not present

## 2018-05-20 DIAGNOSIS — Z79891 Long term (current) use of opiate analgesic: Secondary | ICD-10-CM | POA: Diagnosis not present

## 2018-05-20 DIAGNOSIS — M4802 Spinal stenosis, cervical region: Secondary | ICD-10-CM | POA: Diagnosis not present

## 2018-05-20 DIAGNOSIS — Z602 Problems related to living alone: Secondary | ICD-10-CM | POA: Diagnosis not present

## 2018-05-20 DIAGNOSIS — Z951 Presence of aortocoronary bypass graft: Secondary | ICD-10-CM | POA: Diagnosis not present

## 2018-05-20 DIAGNOSIS — I69351 Hemiplegia and hemiparesis following cerebral infarction affecting right dominant side: Secondary | ICD-10-CM | POA: Diagnosis not present

## 2018-05-22 DIAGNOSIS — M19012 Primary osteoarthritis, left shoulder: Secondary | ICD-10-CM | POA: Diagnosis not present

## 2018-05-22 DIAGNOSIS — M75100 Unspecified rotator cuff tear or rupture of unspecified shoulder, not specified as traumatic: Secondary | ICD-10-CM | POA: Diagnosis not present

## 2018-05-22 DIAGNOSIS — M19019 Primary osteoarthritis, unspecified shoulder: Secondary | ICD-10-CM | POA: Diagnosis not present

## 2018-05-22 DIAGNOSIS — M75101 Unspecified rotator cuff tear or rupture of right shoulder, not specified as traumatic: Secondary | ICD-10-CM | POA: Diagnosis not present

## 2018-05-26 DIAGNOSIS — Z9181 History of falling: Secondary | ICD-10-CM | POA: Diagnosis not present

## 2018-05-26 DIAGNOSIS — K5730 Diverticulosis of large intestine without perforation or abscess without bleeding: Secondary | ICD-10-CM | POA: Diagnosis not present

## 2018-05-26 DIAGNOSIS — I255 Ischemic cardiomyopathy: Secondary | ICD-10-CM | POA: Diagnosis not present

## 2018-05-26 DIAGNOSIS — M4802 Spinal stenosis, cervical region: Secondary | ICD-10-CM | POA: Diagnosis not present

## 2018-05-26 DIAGNOSIS — Z7982 Long term (current) use of aspirin: Secondary | ICD-10-CM | POA: Diagnosis not present

## 2018-05-26 DIAGNOSIS — K279 Peptic ulcer, site unspecified, unspecified as acute or chronic, without hemorrhage or perforation: Secondary | ICD-10-CM | POA: Diagnosis not present

## 2018-05-26 DIAGNOSIS — M48061 Spinal stenosis, lumbar region without neurogenic claudication: Secondary | ICD-10-CM | POA: Diagnosis not present

## 2018-05-26 DIAGNOSIS — I69351 Hemiplegia and hemiparesis following cerebral infarction affecting right dominant side: Secondary | ICD-10-CM | POA: Diagnosis not present

## 2018-05-26 DIAGNOSIS — E871 Hypo-osmolality and hyponatremia: Secondary | ICD-10-CM | POA: Diagnosis not present

## 2018-05-26 DIAGNOSIS — I509 Heart failure, unspecified: Secondary | ICD-10-CM | POA: Diagnosis not present

## 2018-05-26 DIAGNOSIS — E1151 Type 2 diabetes mellitus with diabetic peripheral angiopathy without gangrene: Secondary | ICD-10-CM | POA: Diagnosis not present

## 2018-05-26 DIAGNOSIS — K449 Diaphragmatic hernia without obstruction or gangrene: Secondary | ICD-10-CM | POA: Diagnosis not present

## 2018-05-26 DIAGNOSIS — M810 Age-related osteoporosis without current pathological fracture: Secondary | ICD-10-CM | POA: Diagnosis not present

## 2018-05-26 DIAGNOSIS — I2510 Atherosclerotic heart disease of native coronary artery without angina pectoris: Secondary | ICD-10-CM | POA: Diagnosis not present

## 2018-05-26 DIAGNOSIS — Z79891 Long term (current) use of opiate analgesic: Secondary | ICD-10-CM | POA: Diagnosis not present

## 2018-05-26 DIAGNOSIS — J449 Chronic obstructive pulmonary disease, unspecified: Secondary | ICD-10-CM | POA: Diagnosis not present

## 2018-05-26 DIAGNOSIS — Z951 Presence of aortocoronary bypass graft: Secondary | ICD-10-CM | POA: Diagnosis not present

## 2018-05-26 DIAGNOSIS — I252 Old myocardial infarction: Secondary | ICD-10-CM | POA: Diagnosis not present

## 2018-05-26 DIAGNOSIS — Z602 Problems related to living alone: Secondary | ICD-10-CM | POA: Diagnosis not present

## 2018-05-26 DIAGNOSIS — E039 Hypothyroidism, unspecified: Secondary | ICD-10-CM | POA: Diagnosis not present

## 2018-05-26 DIAGNOSIS — I110 Hypertensive heart disease with heart failure: Secondary | ICD-10-CM | POA: Diagnosis not present

## 2018-05-27 DIAGNOSIS — F5101 Primary insomnia: Secondary | ICD-10-CM | POA: Diagnosis not present

## 2018-05-27 DIAGNOSIS — M7542 Impingement syndrome of left shoulder: Secondary | ICD-10-CM | POA: Diagnosis not present

## 2018-05-27 DIAGNOSIS — E871 Hypo-osmolality and hyponatremia: Secondary | ICD-10-CM | POA: Diagnosis not present

## 2018-05-27 DIAGNOSIS — I110 Hypertensive heart disease with heart failure: Secondary | ICD-10-CM | POA: Diagnosis not present

## 2018-05-27 DIAGNOSIS — E1169 Type 2 diabetes mellitus with other specified complication: Secondary | ICD-10-CM | POA: Diagnosis not present

## 2018-05-27 DIAGNOSIS — R0602 Shortness of breath: Secondary | ICD-10-CM | POA: Diagnosis not present

## 2018-05-29 DIAGNOSIS — M48061 Spinal stenosis, lumbar region without neurogenic claudication: Secondary | ICD-10-CM | POA: Diagnosis not present

## 2018-05-29 DIAGNOSIS — M4802 Spinal stenosis, cervical region: Secondary | ICD-10-CM | POA: Diagnosis not present

## 2018-05-29 DIAGNOSIS — M810 Age-related osteoporosis without current pathological fracture: Secondary | ICD-10-CM | POA: Diagnosis not present

## 2018-05-29 DIAGNOSIS — J449 Chronic obstructive pulmonary disease, unspecified: Secondary | ICD-10-CM | POA: Diagnosis not present

## 2018-05-29 DIAGNOSIS — Z602 Problems related to living alone: Secondary | ICD-10-CM | POA: Diagnosis not present

## 2018-05-29 DIAGNOSIS — I509 Heart failure, unspecified: Secondary | ICD-10-CM | POA: Diagnosis not present

## 2018-05-29 DIAGNOSIS — K5730 Diverticulosis of large intestine without perforation or abscess without bleeding: Secondary | ICD-10-CM | POA: Diagnosis not present

## 2018-05-29 DIAGNOSIS — E039 Hypothyroidism, unspecified: Secondary | ICD-10-CM | POA: Diagnosis not present

## 2018-05-29 DIAGNOSIS — I110 Hypertensive heart disease with heart failure: Secondary | ICD-10-CM | POA: Diagnosis not present

## 2018-05-29 DIAGNOSIS — E871 Hypo-osmolality and hyponatremia: Secondary | ICD-10-CM | POA: Diagnosis not present

## 2018-05-29 DIAGNOSIS — E1151 Type 2 diabetes mellitus with diabetic peripheral angiopathy without gangrene: Secondary | ICD-10-CM | POA: Diagnosis not present

## 2018-05-29 DIAGNOSIS — Z7982 Long term (current) use of aspirin: Secondary | ICD-10-CM | POA: Diagnosis not present

## 2018-05-29 DIAGNOSIS — Z79891 Long term (current) use of opiate analgesic: Secondary | ICD-10-CM | POA: Diagnosis not present

## 2018-05-29 DIAGNOSIS — K449 Diaphragmatic hernia without obstruction or gangrene: Secondary | ICD-10-CM | POA: Diagnosis not present

## 2018-05-29 DIAGNOSIS — Z9181 History of falling: Secondary | ICD-10-CM | POA: Diagnosis not present

## 2018-05-29 DIAGNOSIS — I252 Old myocardial infarction: Secondary | ICD-10-CM | POA: Diagnosis not present

## 2018-05-29 DIAGNOSIS — K279 Peptic ulcer, site unspecified, unspecified as acute or chronic, without hemorrhage or perforation: Secondary | ICD-10-CM | POA: Diagnosis not present

## 2018-05-29 DIAGNOSIS — I255 Ischemic cardiomyopathy: Secondary | ICD-10-CM | POA: Diagnosis not present

## 2018-05-29 DIAGNOSIS — Z951 Presence of aortocoronary bypass graft: Secondary | ICD-10-CM | POA: Diagnosis not present

## 2018-05-29 DIAGNOSIS — I2510 Atherosclerotic heart disease of native coronary artery without angina pectoris: Secondary | ICD-10-CM | POA: Diagnosis not present

## 2018-05-29 DIAGNOSIS — I69351 Hemiplegia and hemiparesis following cerebral infarction affecting right dominant side: Secondary | ICD-10-CM | POA: Diagnosis not present

## 2018-05-30 DIAGNOSIS — Z7982 Long term (current) use of aspirin: Secondary | ICD-10-CM | POA: Diagnosis not present

## 2018-05-30 DIAGNOSIS — E039 Hypothyroidism, unspecified: Secondary | ICD-10-CM | POA: Diagnosis not present

## 2018-05-30 DIAGNOSIS — Z9181 History of falling: Secondary | ICD-10-CM | POA: Diagnosis not present

## 2018-05-30 DIAGNOSIS — I2510 Atherosclerotic heart disease of native coronary artery without angina pectoris: Secondary | ICD-10-CM | POA: Diagnosis not present

## 2018-05-30 DIAGNOSIS — M4802 Spinal stenosis, cervical region: Secondary | ICD-10-CM | POA: Diagnosis not present

## 2018-05-30 DIAGNOSIS — I110 Hypertensive heart disease with heart failure: Secondary | ICD-10-CM | POA: Diagnosis not present

## 2018-05-30 DIAGNOSIS — I255 Ischemic cardiomyopathy: Secondary | ICD-10-CM | POA: Diagnosis not present

## 2018-05-30 DIAGNOSIS — I69351 Hemiplegia and hemiparesis following cerebral infarction affecting right dominant side: Secondary | ICD-10-CM | POA: Diagnosis not present

## 2018-05-30 DIAGNOSIS — K5730 Diverticulosis of large intestine without perforation or abscess without bleeding: Secondary | ICD-10-CM | POA: Diagnosis not present

## 2018-05-30 DIAGNOSIS — Z602 Problems related to living alone: Secondary | ICD-10-CM | POA: Diagnosis not present

## 2018-05-30 DIAGNOSIS — Z79891 Long term (current) use of opiate analgesic: Secondary | ICD-10-CM | POA: Diagnosis not present

## 2018-05-30 DIAGNOSIS — I252 Old myocardial infarction: Secondary | ICD-10-CM | POA: Diagnosis not present

## 2018-05-30 DIAGNOSIS — M48061 Spinal stenosis, lumbar region without neurogenic claudication: Secondary | ICD-10-CM | POA: Diagnosis not present

## 2018-05-30 DIAGNOSIS — E1151 Type 2 diabetes mellitus with diabetic peripheral angiopathy without gangrene: Secondary | ICD-10-CM | POA: Diagnosis not present

## 2018-05-30 DIAGNOSIS — M810 Age-related osteoporosis without current pathological fracture: Secondary | ICD-10-CM | POA: Diagnosis not present

## 2018-05-30 DIAGNOSIS — K449 Diaphragmatic hernia without obstruction or gangrene: Secondary | ICD-10-CM | POA: Diagnosis not present

## 2018-05-30 DIAGNOSIS — I509 Heart failure, unspecified: Secondary | ICD-10-CM | POA: Diagnosis not present

## 2018-05-30 DIAGNOSIS — Z951 Presence of aortocoronary bypass graft: Secondary | ICD-10-CM | POA: Diagnosis not present

## 2018-05-30 DIAGNOSIS — J449 Chronic obstructive pulmonary disease, unspecified: Secondary | ICD-10-CM | POA: Diagnosis not present

## 2018-05-30 DIAGNOSIS — E871 Hypo-osmolality and hyponatremia: Secondary | ICD-10-CM | POA: Diagnosis not present

## 2018-05-30 DIAGNOSIS — K279 Peptic ulcer, site unspecified, unspecified as acute or chronic, without hemorrhage or perforation: Secondary | ICD-10-CM | POA: Diagnosis not present

## 2018-06-02 DIAGNOSIS — I110 Hypertensive heart disease with heart failure: Secondary | ICD-10-CM | POA: Diagnosis not present

## 2018-06-02 DIAGNOSIS — Z7982 Long term (current) use of aspirin: Secondary | ICD-10-CM | POA: Diagnosis not present

## 2018-06-02 DIAGNOSIS — I252 Old myocardial infarction: Secondary | ICD-10-CM | POA: Diagnosis not present

## 2018-06-02 DIAGNOSIS — I255 Ischemic cardiomyopathy: Secondary | ICD-10-CM | POA: Diagnosis not present

## 2018-06-02 DIAGNOSIS — K279 Peptic ulcer, site unspecified, unspecified as acute or chronic, without hemorrhage or perforation: Secondary | ICD-10-CM | POA: Diagnosis not present

## 2018-06-02 DIAGNOSIS — J449 Chronic obstructive pulmonary disease, unspecified: Secondary | ICD-10-CM | POA: Diagnosis not present

## 2018-06-02 DIAGNOSIS — M4802 Spinal stenosis, cervical region: Secondary | ICD-10-CM | POA: Diagnosis not present

## 2018-06-02 DIAGNOSIS — E871 Hypo-osmolality and hyponatremia: Secondary | ICD-10-CM | POA: Diagnosis not present

## 2018-06-02 DIAGNOSIS — Z951 Presence of aortocoronary bypass graft: Secondary | ICD-10-CM | POA: Diagnosis not present

## 2018-06-02 DIAGNOSIS — K449 Diaphragmatic hernia without obstruction or gangrene: Secondary | ICD-10-CM | POA: Diagnosis not present

## 2018-06-02 DIAGNOSIS — M810 Age-related osteoporosis without current pathological fracture: Secondary | ICD-10-CM | POA: Diagnosis not present

## 2018-06-02 DIAGNOSIS — Z79891 Long term (current) use of opiate analgesic: Secondary | ICD-10-CM | POA: Diagnosis not present

## 2018-06-02 DIAGNOSIS — E039 Hypothyroidism, unspecified: Secondary | ICD-10-CM | POA: Diagnosis not present

## 2018-06-02 DIAGNOSIS — E1151 Type 2 diabetes mellitus with diabetic peripheral angiopathy without gangrene: Secondary | ICD-10-CM | POA: Diagnosis not present

## 2018-06-02 DIAGNOSIS — M48061 Spinal stenosis, lumbar region without neurogenic claudication: Secondary | ICD-10-CM | POA: Diagnosis not present

## 2018-06-02 DIAGNOSIS — I2510 Atherosclerotic heart disease of native coronary artery without angina pectoris: Secondary | ICD-10-CM | POA: Diagnosis not present

## 2018-06-02 DIAGNOSIS — Z9181 History of falling: Secondary | ICD-10-CM | POA: Diagnosis not present

## 2018-06-02 DIAGNOSIS — Z602 Problems related to living alone: Secondary | ICD-10-CM | POA: Diagnosis not present

## 2018-06-02 DIAGNOSIS — I69351 Hemiplegia and hemiparesis following cerebral infarction affecting right dominant side: Secondary | ICD-10-CM | POA: Diagnosis not present

## 2018-06-02 DIAGNOSIS — I509 Heart failure, unspecified: Secondary | ICD-10-CM | POA: Diagnosis not present

## 2018-06-02 DIAGNOSIS — K5730 Diverticulosis of large intestine without perforation or abscess without bleeding: Secondary | ICD-10-CM | POA: Diagnosis not present

## 2018-06-03 DIAGNOSIS — Z602 Problems related to living alone: Secondary | ICD-10-CM | POA: Diagnosis not present

## 2018-06-03 DIAGNOSIS — I69351 Hemiplegia and hemiparesis following cerebral infarction affecting right dominant side: Secondary | ICD-10-CM | POA: Diagnosis not present

## 2018-06-03 DIAGNOSIS — Z7982 Long term (current) use of aspirin: Secondary | ICD-10-CM | POA: Diagnosis not present

## 2018-06-03 DIAGNOSIS — I110 Hypertensive heart disease with heart failure: Secondary | ICD-10-CM | POA: Diagnosis not present

## 2018-06-03 DIAGNOSIS — J449 Chronic obstructive pulmonary disease, unspecified: Secondary | ICD-10-CM | POA: Diagnosis not present

## 2018-06-03 DIAGNOSIS — Z79891 Long term (current) use of opiate analgesic: Secondary | ICD-10-CM | POA: Diagnosis not present

## 2018-06-03 DIAGNOSIS — M4802 Spinal stenosis, cervical region: Secondary | ICD-10-CM | POA: Diagnosis not present

## 2018-06-03 DIAGNOSIS — K5730 Diverticulosis of large intestine without perforation or abscess without bleeding: Secondary | ICD-10-CM | POA: Diagnosis not present

## 2018-06-03 DIAGNOSIS — K449 Diaphragmatic hernia without obstruction or gangrene: Secondary | ICD-10-CM | POA: Diagnosis not present

## 2018-06-03 DIAGNOSIS — I252 Old myocardial infarction: Secondary | ICD-10-CM | POA: Diagnosis not present

## 2018-06-03 DIAGNOSIS — I2510 Atherosclerotic heart disease of native coronary artery without angina pectoris: Secondary | ICD-10-CM | POA: Diagnosis not present

## 2018-06-03 DIAGNOSIS — I509 Heart failure, unspecified: Secondary | ICD-10-CM | POA: Diagnosis not present

## 2018-06-03 DIAGNOSIS — E1151 Type 2 diabetes mellitus with diabetic peripheral angiopathy without gangrene: Secondary | ICD-10-CM | POA: Diagnosis not present

## 2018-06-03 DIAGNOSIS — E871 Hypo-osmolality and hyponatremia: Secondary | ICD-10-CM | POA: Diagnosis not present

## 2018-06-03 DIAGNOSIS — Z951 Presence of aortocoronary bypass graft: Secondary | ICD-10-CM | POA: Diagnosis not present

## 2018-06-03 DIAGNOSIS — M48061 Spinal stenosis, lumbar region without neurogenic claudication: Secondary | ICD-10-CM | POA: Diagnosis not present

## 2018-06-03 DIAGNOSIS — K279 Peptic ulcer, site unspecified, unspecified as acute or chronic, without hemorrhage or perforation: Secondary | ICD-10-CM | POA: Diagnosis not present

## 2018-06-03 DIAGNOSIS — M810 Age-related osteoporosis without current pathological fracture: Secondary | ICD-10-CM | POA: Diagnosis not present

## 2018-06-03 DIAGNOSIS — E039 Hypothyroidism, unspecified: Secondary | ICD-10-CM | POA: Diagnosis not present

## 2018-06-03 DIAGNOSIS — Z9181 History of falling: Secondary | ICD-10-CM | POA: Diagnosis not present

## 2018-06-03 DIAGNOSIS — I255 Ischemic cardiomyopathy: Secondary | ICD-10-CM | POA: Diagnosis not present

## 2018-06-04 DIAGNOSIS — Z7982 Long term (current) use of aspirin: Secondary | ICD-10-CM | POA: Diagnosis not present

## 2018-06-04 DIAGNOSIS — Z602 Problems related to living alone: Secondary | ICD-10-CM | POA: Diagnosis not present

## 2018-06-04 DIAGNOSIS — K5730 Diverticulosis of large intestine without perforation or abscess without bleeding: Secondary | ICD-10-CM | POA: Diagnosis not present

## 2018-06-04 DIAGNOSIS — Z9181 History of falling: Secondary | ICD-10-CM | POA: Diagnosis not present

## 2018-06-04 DIAGNOSIS — Z951 Presence of aortocoronary bypass graft: Secondary | ICD-10-CM | POA: Diagnosis not present

## 2018-06-04 DIAGNOSIS — K279 Peptic ulcer, site unspecified, unspecified as acute or chronic, without hemorrhage or perforation: Secondary | ICD-10-CM | POA: Diagnosis not present

## 2018-06-04 DIAGNOSIS — I110 Hypertensive heart disease with heart failure: Secondary | ICD-10-CM | POA: Diagnosis not present

## 2018-06-04 DIAGNOSIS — M810 Age-related osteoporosis without current pathological fracture: Secondary | ICD-10-CM | POA: Diagnosis not present

## 2018-06-04 DIAGNOSIS — I69351 Hemiplegia and hemiparesis following cerebral infarction affecting right dominant side: Secondary | ICD-10-CM | POA: Diagnosis not present

## 2018-06-04 DIAGNOSIS — M4802 Spinal stenosis, cervical region: Secondary | ICD-10-CM | POA: Diagnosis not present

## 2018-06-04 DIAGNOSIS — M48061 Spinal stenosis, lumbar region without neurogenic claudication: Secondary | ICD-10-CM | POA: Diagnosis not present

## 2018-06-04 DIAGNOSIS — K449 Diaphragmatic hernia without obstruction or gangrene: Secondary | ICD-10-CM | POA: Diagnosis not present

## 2018-06-04 DIAGNOSIS — E871 Hypo-osmolality and hyponatremia: Secondary | ICD-10-CM | POA: Diagnosis not present

## 2018-06-04 DIAGNOSIS — I509 Heart failure, unspecified: Secondary | ICD-10-CM | POA: Diagnosis not present

## 2018-06-04 DIAGNOSIS — I255 Ischemic cardiomyopathy: Secondary | ICD-10-CM | POA: Diagnosis not present

## 2018-06-04 DIAGNOSIS — Z79891 Long term (current) use of opiate analgesic: Secondary | ICD-10-CM | POA: Diagnosis not present

## 2018-06-04 DIAGNOSIS — E039 Hypothyroidism, unspecified: Secondary | ICD-10-CM | POA: Diagnosis not present

## 2018-06-04 DIAGNOSIS — I2510 Atherosclerotic heart disease of native coronary artery without angina pectoris: Secondary | ICD-10-CM | POA: Diagnosis not present

## 2018-06-04 DIAGNOSIS — J449 Chronic obstructive pulmonary disease, unspecified: Secondary | ICD-10-CM | POA: Diagnosis not present

## 2018-06-04 DIAGNOSIS — E1151 Type 2 diabetes mellitus with diabetic peripheral angiopathy without gangrene: Secondary | ICD-10-CM | POA: Diagnosis not present

## 2018-06-04 DIAGNOSIS — I252 Old myocardial infarction: Secondary | ICD-10-CM | POA: Diagnosis not present

## 2018-06-06 DIAGNOSIS — E1151 Type 2 diabetes mellitus with diabetic peripheral angiopathy without gangrene: Secondary | ICD-10-CM | POA: Diagnosis not present

## 2018-06-06 DIAGNOSIS — I110 Hypertensive heart disease with heart failure: Secondary | ICD-10-CM | POA: Diagnosis not present

## 2018-06-06 DIAGNOSIS — K5730 Diverticulosis of large intestine without perforation or abscess without bleeding: Secondary | ICD-10-CM | POA: Diagnosis not present

## 2018-06-06 DIAGNOSIS — E039 Hypothyroidism, unspecified: Secondary | ICD-10-CM | POA: Diagnosis not present

## 2018-06-06 DIAGNOSIS — Z602 Problems related to living alone: Secondary | ICD-10-CM | POA: Diagnosis not present

## 2018-06-06 DIAGNOSIS — I509 Heart failure, unspecified: Secondary | ICD-10-CM | POA: Diagnosis not present

## 2018-06-06 DIAGNOSIS — M48061 Spinal stenosis, lumbar region without neurogenic claudication: Secondary | ICD-10-CM | POA: Diagnosis not present

## 2018-06-06 DIAGNOSIS — K449 Diaphragmatic hernia without obstruction or gangrene: Secondary | ICD-10-CM | POA: Diagnosis not present

## 2018-06-06 DIAGNOSIS — I69351 Hemiplegia and hemiparesis following cerebral infarction affecting right dominant side: Secondary | ICD-10-CM | POA: Diagnosis not present

## 2018-06-06 DIAGNOSIS — Z79891 Long term (current) use of opiate analgesic: Secondary | ICD-10-CM | POA: Diagnosis not present

## 2018-06-06 DIAGNOSIS — Z7982 Long term (current) use of aspirin: Secondary | ICD-10-CM | POA: Diagnosis not present

## 2018-06-06 DIAGNOSIS — Z951 Presence of aortocoronary bypass graft: Secondary | ICD-10-CM | POA: Diagnosis not present

## 2018-06-06 DIAGNOSIS — M810 Age-related osteoporosis without current pathological fracture: Secondary | ICD-10-CM | POA: Diagnosis not present

## 2018-06-06 DIAGNOSIS — I2510 Atherosclerotic heart disease of native coronary artery without angina pectoris: Secondary | ICD-10-CM | POA: Diagnosis not present

## 2018-06-06 DIAGNOSIS — K279 Peptic ulcer, site unspecified, unspecified as acute or chronic, without hemorrhage or perforation: Secondary | ICD-10-CM | POA: Diagnosis not present

## 2018-06-06 DIAGNOSIS — I252 Old myocardial infarction: Secondary | ICD-10-CM | POA: Diagnosis not present

## 2018-06-06 DIAGNOSIS — J449 Chronic obstructive pulmonary disease, unspecified: Secondary | ICD-10-CM | POA: Diagnosis not present

## 2018-06-06 DIAGNOSIS — Z9181 History of falling: Secondary | ICD-10-CM | POA: Diagnosis not present

## 2018-06-06 DIAGNOSIS — E871 Hypo-osmolality and hyponatremia: Secondary | ICD-10-CM | POA: Diagnosis not present

## 2018-06-06 DIAGNOSIS — M4802 Spinal stenosis, cervical region: Secondary | ICD-10-CM | POA: Diagnosis not present

## 2018-06-06 DIAGNOSIS — I255 Ischemic cardiomyopathy: Secondary | ICD-10-CM | POA: Diagnosis not present

## 2018-06-09 DIAGNOSIS — E871 Hypo-osmolality and hyponatremia: Secondary | ICD-10-CM | POA: Diagnosis not present

## 2018-06-09 DIAGNOSIS — Z79891 Long term (current) use of opiate analgesic: Secondary | ICD-10-CM | POA: Diagnosis not present

## 2018-06-09 DIAGNOSIS — Z602 Problems related to living alone: Secondary | ICD-10-CM | POA: Diagnosis not present

## 2018-06-09 DIAGNOSIS — I509 Heart failure, unspecified: Secondary | ICD-10-CM | POA: Diagnosis not present

## 2018-06-09 DIAGNOSIS — E1151 Type 2 diabetes mellitus with diabetic peripheral angiopathy without gangrene: Secondary | ICD-10-CM | POA: Diagnosis not present

## 2018-06-09 DIAGNOSIS — Z7982 Long term (current) use of aspirin: Secondary | ICD-10-CM | POA: Diagnosis not present

## 2018-06-09 DIAGNOSIS — J449 Chronic obstructive pulmonary disease, unspecified: Secondary | ICD-10-CM | POA: Diagnosis not present

## 2018-06-09 DIAGNOSIS — I255 Ischemic cardiomyopathy: Secondary | ICD-10-CM | POA: Diagnosis not present

## 2018-06-09 DIAGNOSIS — M4802 Spinal stenosis, cervical region: Secondary | ICD-10-CM | POA: Diagnosis not present

## 2018-06-09 DIAGNOSIS — I69351 Hemiplegia and hemiparesis following cerebral infarction affecting right dominant side: Secondary | ICD-10-CM | POA: Diagnosis not present

## 2018-06-09 DIAGNOSIS — I110 Hypertensive heart disease with heart failure: Secondary | ICD-10-CM | POA: Diagnosis not present

## 2018-06-09 DIAGNOSIS — K5730 Diverticulosis of large intestine without perforation or abscess without bleeding: Secondary | ICD-10-CM | POA: Diagnosis not present

## 2018-06-09 DIAGNOSIS — Z9181 History of falling: Secondary | ICD-10-CM | POA: Diagnosis not present

## 2018-06-09 DIAGNOSIS — M810 Age-related osteoporosis without current pathological fracture: Secondary | ICD-10-CM | POA: Diagnosis not present

## 2018-06-09 DIAGNOSIS — Z951 Presence of aortocoronary bypass graft: Secondary | ICD-10-CM | POA: Diagnosis not present

## 2018-06-09 DIAGNOSIS — I2510 Atherosclerotic heart disease of native coronary artery without angina pectoris: Secondary | ICD-10-CM | POA: Diagnosis not present

## 2018-06-09 DIAGNOSIS — M48061 Spinal stenosis, lumbar region without neurogenic claudication: Secondary | ICD-10-CM | POA: Diagnosis not present

## 2018-06-09 DIAGNOSIS — K279 Peptic ulcer, site unspecified, unspecified as acute or chronic, without hemorrhage or perforation: Secondary | ICD-10-CM | POA: Diagnosis not present

## 2018-06-09 DIAGNOSIS — I252 Old myocardial infarction: Secondary | ICD-10-CM | POA: Diagnosis not present

## 2018-06-09 DIAGNOSIS — E039 Hypothyroidism, unspecified: Secondary | ICD-10-CM | POA: Diagnosis not present

## 2018-06-09 DIAGNOSIS — K449 Diaphragmatic hernia without obstruction or gangrene: Secondary | ICD-10-CM | POA: Diagnosis not present

## 2018-06-11 DIAGNOSIS — K5730 Diverticulosis of large intestine without perforation or abscess without bleeding: Secondary | ICD-10-CM | POA: Diagnosis not present

## 2018-06-11 DIAGNOSIS — I255 Ischemic cardiomyopathy: Secondary | ICD-10-CM | POA: Diagnosis not present

## 2018-06-11 DIAGNOSIS — I252 Old myocardial infarction: Secondary | ICD-10-CM | POA: Diagnosis not present

## 2018-06-11 DIAGNOSIS — K449 Diaphragmatic hernia without obstruction or gangrene: Secondary | ICD-10-CM | POA: Diagnosis not present

## 2018-06-11 DIAGNOSIS — E1151 Type 2 diabetes mellitus with diabetic peripheral angiopathy without gangrene: Secondary | ICD-10-CM | POA: Diagnosis not present

## 2018-06-11 DIAGNOSIS — M48061 Spinal stenosis, lumbar region without neurogenic claudication: Secondary | ICD-10-CM | POA: Diagnosis not present

## 2018-06-11 DIAGNOSIS — Z602 Problems related to living alone: Secondary | ICD-10-CM | POA: Diagnosis not present

## 2018-06-11 DIAGNOSIS — Z951 Presence of aortocoronary bypass graft: Secondary | ICD-10-CM | POA: Diagnosis not present

## 2018-06-11 DIAGNOSIS — M810 Age-related osteoporosis without current pathological fracture: Secondary | ICD-10-CM | POA: Diagnosis not present

## 2018-06-11 DIAGNOSIS — I509 Heart failure, unspecified: Secondary | ICD-10-CM | POA: Diagnosis not present

## 2018-06-11 DIAGNOSIS — Z79891 Long term (current) use of opiate analgesic: Secondary | ICD-10-CM | POA: Diagnosis not present

## 2018-06-11 DIAGNOSIS — K279 Peptic ulcer, site unspecified, unspecified as acute or chronic, without hemorrhage or perforation: Secondary | ICD-10-CM | POA: Diagnosis not present

## 2018-06-11 DIAGNOSIS — Z7982 Long term (current) use of aspirin: Secondary | ICD-10-CM | POA: Diagnosis not present

## 2018-06-11 DIAGNOSIS — I69351 Hemiplegia and hemiparesis following cerebral infarction affecting right dominant side: Secondary | ICD-10-CM | POA: Diagnosis not present

## 2018-06-11 DIAGNOSIS — E039 Hypothyroidism, unspecified: Secondary | ICD-10-CM | POA: Diagnosis not present

## 2018-06-11 DIAGNOSIS — M4802 Spinal stenosis, cervical region: Secondary | ICD-10-CM | POA: Diagnosis not present

## 2018-06-11 DIAGNOSIS — J449 Chronic obstructive pulmonary disease, unspecified: Secondary | ICD-10-CM | POA: Diagnosis not present

## 2018-06-11 DIAGNOSIS — Z9181 History of falling: Secondary | ICD-10-CM | POA: Diagnosis not present

## 2018-06-11 DIAGNOSIS — I2510 Atherosclerotic heart disease of native coronary artery without angina pectoris: Secondary | ICD-10-CM | POA: Diagnosis not present

## 2018-06-11 DIAGNOSIS — E871 Hypo-osmolality and hyponatremia: Secondary | ICD-10-CM | POA: Diagnosis not present

## 2018-06-11 DIAGNOSIS — I110 Hypertensive heart disease with heart failure: Secondary | ICD-10-CM | POA: Diagnosis not present

## 2018-06-12 DIAGNOSIS — M4802 Spinal stenosis, cervical region: Secondary | ICD-10-CM | POA: Diagnosis not present

## 2018-06-12 DIAGNOSIS — Z7982 Long term (current) use of aspirin: Secondary | ICD-10-CM | POA: Diagnosis not present

## 2018-06-12 DIAGNOSIS — Z951 Presence of aortocoronary bypass graft: Secondary | ICD-10-CM | POA: Diagnosis not present

## 2018-06-12 DIAGNOSIS — J449 Chronic obstructive pulmonary disease, unspecified: Secondary | ICD-10-CM | POA: Diagnosis not present

## 2018-06-12 DIAGNOSIS — I2510 Atherosclerotic heart disease of native coronary artery without angina pectoris: Secondary | ICD-10-CM | POA: Diagnosis not present

## 2018-06-12 DIAGNOSIS — M48061 Spinal stenosis, lumbar region without neurogenic claudication: Secondary | ICD-10-CM | POA: Diagnosis not present

## 2018-06-12 DIAGNOSIS — Z602 Problems related to living alone: Secondary | ICD-10-CM | POA: Diagnosis not present

## 2018-06-12 DIAGNOSIS — I255 Ischemic cardiomyopathy: Secondary | ICD-10-CM | POA: Diagnosis not present

## 2018-06-12 DIAGNOSIS — I252 Old myocardial infarction: Secondary | ICD-10-CM | POA: Diagnosis not present

## 2018-06-12 DIAGNOSIS — I509 Heart failure, unspecified: Secondary | ICD-10-CM | POA: Diagnosis not present

## 2018-06-12 DIAGNOSIS — E039 Hypothyroidism, unspecified: Secondary | ICD-10-CM | POA: Diagnosis not present

## 2018-06-12 DIAGNOSIS — K279 Peptic ulcer, site unspecified, unspecified as acute or chronic, without hemorrhage or perforation: Secondary | ICD-10-CM | POA: Diagnosis not present

## 2018-06-12 DIAGNOSIS — K5730 Diverticulosis of large intestine without perforation or abscess without bleeding: Secondary | ICD-10-CM | POA: Diagnosis not present

## 2018-06-12 DIAGNOSIS — E871 Hypo-osmolality and hyponatremia: Secondary | ICD-10-CM | POA: Diagnosis not present

## 2018-06-12 DIAGNOSIS — K449 Diaphragmatic hernia without obstruction or gangrene: Secondary | ICD-10-CM | POA: Diagnosis not present

## 2018-06-12 DIAGNOSIS — Z79891 Long term (current) use of opiate analgesic: Secondary | ICD-10-CM | POA: Diagnosis not present

## 2018-06-12 DIAGNOSIS — I110 Hypertensive heart disease with heart failure: Secondary | ICD-10-CM | POA: Diagnosis not present

## 2018-06-12 DIAGNOSIS — E1151 Type 2 diabetes mellitus with diabetic peripheral angiopathy without gangrene: Secondary | ICD-10-CM | POA: Diagnosis not present

## 2018-06-12 DIAGNOSIS — M810 Age-related osteoporosis without current pathological fracture: Secondary | ICD-10-CM | POA: Diagnosis not present

## 2018-06-12 DIAGNOSIS — Z9181 History of falling: Secondary | ICD-10-CM | POA: Diagnosis not present

## 2018-06-12 DIAGNOSIS — I69351 Hemiplegia and hemiparesis following cerebral infarction affecting right dominant side: Secondary | ICD-10-CM | POA: Diagnosis not present

## 2018-06-16 DIAGNOSIS — Z7982 Long term (current) use of aspirin: Secondary | ICD-10-CM | POA: Diagnosis not present

## 2018-06-16 DIAGNOSIS — E871 Hypo-osmolality and hyponatremia: Secondary | ICD-10-CM | POA: Diagnosis not present

## 2018-06-16 DIAGNOSIS — Z9181 History of falling: Secondary | ICD-10-CM | POA: Diagnosis not present

## 2018-06-16 DIAGNOSIS — M4802 Spinal stenosis, cervical region: Secondary | ICD-10-CM | POA: Diagnosis not present

## 2018-06-16 DIAGNOSIS — I69351 Hemiplegia and hemiparesis following cerebral infarction affecting right dominant side: Secondary | ICD-10-CM | POA: Diagnosis not present

## 2018-06-16 DIAGNOSIS — Z602 Problems related to living alone: Secondary | ICD-10-CM | POA: Diagnosis not present

## 2018-06-16 DIAGNOSIS — Z951 Presence of aortocoronary bypass graft: Secondary | ICD-10-CM | POA: Diagnosis not present

## 2018-06-16 DIAGNOSIS — E1151 Type 2 diabetes mellitus with diabetic peripheral angiopathy without gangrene: Secondary | ICD-10-CM | POA: Diagnosis not present

## 2018-06-16 DIAGNOSIS — I255 Ischemic cardiomyopathy: Secondary | ICD-10-CM | POA: Diagnosis not present

## 2018-06-16 DIAGNOSIS — I509 Heart failure, unspecified: Secondary | ICD-10-CM | POA: Diagnosis not present

## 2018-06-16 DIAGNOSIS — J449 Chronic obstructive pulmonary disease, unspecified: Secondary | ICD-10-CM | POA: Diagnosis not present

## 2018-06-16 DIAGNOSIS — M810 Age-related osteoporosis without current pathological fracture: Secondary | ICD-10-CM | POA: Diagnosis not present

## 2018-06-16 DIAGNOSIS — Z79891 Long term (current) use of opiate analgesic: Secondary | ICD-10-CM | POA: Diagnosis not present

## 2018-06-16 DIAGNOSIS — M48061 Spinal stenosis, lumbar region without neurogenic claudication: Secondary | ICD-10-CM | POA: Diagnosis not present

## 2018-06-16 DIAGNOSIS — K449 Diaphragmatic hernia without obstruction or gangrene: Secondary | ICD-10-CM | POA: Diagnosis not present

## 2018-06-16 DIAGNOSIS — I252 Old myocardial infarction: Secondary | ICD-10-CM | POA: Diagnosis not present

## 2018-06-16 DIAGNOSIS — I110 Hypertensive heart disease with heart failure: Secondary | ICD-10-CM | POA: Diagnosis not present

## 2018-06-16 DIAGNOSIS — K5730 Diverticulosis of large intestine without perforation or abscess without bleeding: Secondary | ICD-10-CM | POA: Diagnosis not present

## 2018-06-16 DIAGNOSIS — E039 Hypothyroidism, unspecified: Secondary | ICD-10-CM | POA: Diagnosis not present

## 2018-06-16 DIAGNOSIS — I2510 Atherosclerotic heart disease of native coronary artery without angina pectoris: Secondary | ICD-10-CM | POA: Diagnosis not present

## 2018-06-16 DIAGNOSIS — K279 Peptic ulcer, site unspecified, unspecified as acute or chronic, without hemorrhage or perforation: Secondary | ICD-10-CM | POA: Diagnosis not present

## 2018-06-18 ENCOUNTER — Ambulatory Visit (HOSPITAL_COMMUNITY)
Admission: RE | Admit: 2018-06-18 | Payer: Medicare Other | Source: Ambulatory Visit | Attending: Cardiology | Admitting: Cardiology

## 2018-06-19 DIAGNOSIS — E039 Hypothyroidism, unspecified: Secondary | ICD-10-CM | POA: Diagnosis not present

## 2018-06-19 DIAGNOSIS — Z9181 History of falling: Secondary | ICD-10-CM | POA: Diagnosis not present

## 2018-06-19 DIAGNOSIS — I252 Old myocardial infarction: Secondary | ICD-10-CM | POA: Diagnosis not present

## 2018-06-19 DIAGNOSIS — M810 Age-related osteoporosis without current pathological fracture: Secondary | ICD-10-CM | POA: Diagnosis not present

## 2018-06-19 DIAGNOSIS — J449 Chronic obstructive pulmonary disease, unspecified: Secondary | ICD-10-CM | POA: Diagnosis not present

## 2018-06-19 DIAGNOSIS — I110 Hypertensive heart disease with heart failure: Secondary | ICD-10-CM | POA: Diagnosis not present

## 2018-06-19 DIAGNOSIS — Z79891 Long term (current) use of opiate analgesic: Secondary | ICD-10-CM | POA: Diagnosis not present

## 2018-06-19 DIAGNOSIS — I2510 Atherosclerotic heart disease of native coronary artery without angina pectoris: Secondary | ICD-10-CM | POA: Diagnosis not present

## 2018-06-19 DIAGNOSIS — I69351 Hemiplegia and hemiparesis following cerebral infarction affecting right dominant side: Secondary | ICD-10-CM | POA: Diagnosis not present

## 2018-06-19 DIAGNOSIS — K5730 Diverticulosis of large intestine without perforation or abscess without bleeding: Secondary | ICD-10-CM | POA: Diagnosis not present

## 2018-06-19 DIAGNOSIS — M4802 Spinal stenosis, cervical region: Secondary | ICD-10-CM | POA: Diagnosis not present

## 2018-06-19 DIAGNOSIS — Z951 Presence of aortocoronary bypass graft: Secondary | ICD-10-CM | POA: Diagnosis not present

## 2018-06-19 DIAGNOSIS — Z602 Problems related to living alone: Secondary | ICD-10-CM | POA: Diagnosis not present

## 2018-06-19 DIAGNOSIS — M48061 Spinal stenosis, lumbar region without neurogenic claudication: Secondary | ICD-10-CM | POA: Diagnosis not present

## 2018-06-19 DIAGNOSIS — K449 Diaphragmatic hernia without obstruction or gangrene: Secondary | ICD-10-CM | POA: Diagnosis not present

## 2018-06-19 DIAGNOSIS — E871 Hypo-osmolality and hyponatremia: Secondary | ICD-10-CM | POA: Diagnosis not present

## 2018-06-19 DIAGNOSIS — I509 Heart failure, unspecified: Secondary | ICD-10-CM | POA: Diagnosis not present

## 2018-06-19 DIAGNOSIS — E1151 Type 2 diabetes mellitus with diabetic peripheral angiopathy without gangrene: Secondary | ICD-10-CM | POA: Diagnosis not present

## 2018-06-19 DIAGNOSIS — Z7982 Long term (current) use of aspirin: Secondary | ICD-10-CM | POA: Diagnosis not present

## 2018-06-19 DIAGNOSIS — K279 Peptic ulcer, site unspecified, unspecified as acute or chronic, without hemorrhage or perforation: Secondary | ICD-10-CM | POA: Diagnosis not present

## 2018-06-19 DIAGNOSIS — I255 Ischemic cardiomyopathy: Secondary | ICD-10-CM | POA: Diagnosis not present

## 2018-07-02 DIAGNOSIS — N3001 Acute cystitis with hematuria: Secondary | ICD-10-CM | POA: Diagnosis not present

## 2018-07-02 DIAGNOSIS — N3000 Acute cystitis without hematuria: Secondary | ICD-10-CM | POA: Diagnosis not present

## 2018-07-02 DIAGNOSIS — I110 Hypertensive heart disease with heart failure: Secondary | ICD-10-CM | POA: Diagnosis not present

## 2018-07-02 DIAGNOSIS — E782 Mixed hyperlipidemia: Secondary | ICD-10-CM | POA: Diagnosis not present

## 2018-07-02 DIAGNOSIS — Z23 Encounter for immunization: Secondary | ICD-10-CM | POA: Diagnosis not present

## 2018-07-02 DIAGNOSIS — E1121 Type 2 diabetes mellitus with diabetic nephropathy: Secondary | ICD-10-CM | POA: Diagnosis not present

## 2018-07-03 ENCOUNTER — Other Ambulatory Visit: Payer: Self-pay

## 2018-07-03 NOTE — Patient Outreach (Signed)
[facility]) Care Management  07/03/2018  [name] 1926-09-02 [id number]   Medication Adherence call to [name] spoke with patient she is no longer taking Pravastatin 10 mg doctor took her off and is going to put her on an injectable medication for Cholesterol  per patient new information. [name] is showing past due under [place].   [place] [facility] Direct Dial [phone]  Fax [phone] [name]

## 2018-07-09 DIAGNOSIS — N3001 Acute cystitis with hematuria: Secondary | ICD-10-CM | POA: Diagnosis not present

## 2018-08-06 ENCOUNTER — Ambulatory Visit: Payer: Medicare Other | Admitting: Cardiology

## 2018-08-06 ENCOUNTER — Encounter: Payer: Self-pay | Admitting: Cardiology

## 2018-08-06 VITALS — BP 132/62 | HR 63 | Ht 64.5 in | Wt 153.4 lb

## 2018-08-06 DIAGNOSIS — I10 Essential (primary) hypertension: Secondary | ICD-10-CM

## 2018-08-06 DIAGNOSIS — I739 Peripheral vascular disease, unspecified: Secondary | ICD-10-CM | POA: Diagnosis not present

## 2018-08-06 DIAGNOSIS — I25810 Atherosclerosis of coronary artery bypass graft(s) without angina pectoris: Secondary | ICD-10-CM | POA: Diagnosis not present

## 2018-08-06 DIAGNOSIS — I25709 Atherosclerosis of coronary artery bypass graft(s), unspecified, with unspecified angina pectoris: Secondary | ICD-10-CM | POA: Diagnosis not present

## 2018-08-06 NOTE — Progress Notes (Signed)
Cardiology Office Note:    Date:  08/06/2018   ID:  [name], DOB 26-Sep-1926, MRN [id number]  PCP:  Rochel Brome, MD  Cardiologist:  Jenne Campus, MD    Referring MD: Rochel Brome, MD   Chief Complaint  Patient presents with  . Heart Problem  Doing well  History of Present Illness:    [name] is a 84 y.o. female with complex past medical history.  Coronary artery disease status post coronary artery bypass graft, peripheral vascular disease with carotid arterial stenosis without critical lesions, recently admitted to the hospital in September because of hyponatremia.  All problems has been corrected she was discharged home overall doing well lives alone independently.  Family lives nearby and ready to assist her.  She does have a life alert.  Denies have any chest pain tightness squeezing pressure burning chest.  Complain of having some shortness of breath and fatigue.  Also complained of having dizziness.  No passing out.  Past Medical History:  Diagnosis Date  . Anemia    hx  . Arthritis   . CAD (coronary artery disease)   . Cardiomyopathy, ischemic    EF 40% at the time cath  . Depression   . Diabetes mellitus without complication (HCC)    borderline no meds  . Dyslipidemia   . GERD (gastroesophageal reflux disease)   . HTN (hypertension)   . Myocardial infarction (HCC)    mild  . Stroke ([place])   . Urinary frequency     Past Surgical History:  Procedure Laterality Date  . APPENDECTOMY    . CESAREAN SECTION     x2  . CORONARY ARTERY BYPASS GRAFT     LIMA to the LAD, SVG to diagonal, SVG to obtuse marginal, SVG to PDA..  2008  . LUMBAR LAMINECTOMY/DECOMPRESSION MICRODISCECTOMY N/A 05/01/2013   Procedure: LUMBAR LAMINECTOMY/DECOMPRESSION MICRODISCECTOMY 2 LEVELS;  Surgeon: Winfield Cunas, MD;  Location: [place] NEUR[facility] ORS;  Service: Neurosurgery;  Laterality: N/A;  Lumbar Three-Four, Four-Five Laminectomies   . LUMBAR LAMINECTOMY/DECOMPRESSION MICRODISCECTOMY  N/A 07/14/2014   Procedure: LUMBAR LAMINECTOMY/DECOMPRESSION MICRODISCECTOMY 1 LEVEL LUMBAR TWO-THREE;  Surgeon: Ashok Pall, MD;  Location: [place] NEURO ORS;  Service: Neurosurgery;  Laterality: N/A;  . SHOULDER ARTHROSCOPY W/ ROTATOR CUFF REPAIR Right    04  . TONSILLECTOMY      Current Medications: Current Meds  Medication Sig  . amLODipine (NORVASC) 5 MG tablet Take 5 mg by mouth daily.  Marland Kitchen aspirin EC 81 MG tablet Take 81 mg by mouth daily.  . Cholecalciferol (VITAMIN D3) 25 MCG (1000 UT) CAPS Take 1 capsule by mouth daily.  . fish oil-omega-3 fatty acids 1000 MG capsule Take 1 g by mouth daily.   . furosemide (LASIX) 20 MG tablet Take 20 mg by mouth 2 (two) times daily as needed for fluid.   Marland Kitchen levothyroxine (SYNTHROID, LEVOTHROID) 50 MCG tablet Take 1 tablet by mouth daily.  Marland Kitchen lisinopril (PRINIVIL,ZESTRIL) 40 MG tablet Take 40 mg by mouth daily.   Marland Kitchen LORazepam (ATIVAN) 0.5 MG tablet Take 0.5 mg by mouth at bedtime.  . sertraline (ZOLOFT) 100 MG tablet Take 100 mg by mouth at bedtime.     Allergies:   Atorvastatin; Statins; Amlodipine; Crestor [rosuvastatin calcium]; Pravastatin; Simvastatin; and Penicillins   Social History   Socioeconomic History  . Marital status: Married    Spouse name: Not on file  . Number of children: Not on file  . Years of education: Not on file  .  Highest education level: Not on file  Occupational History  . Not on file  Social Needs  . Financial resource strain: Not on file  . Food insecurity:    Worry: Not on file    Inability: Not on file  . Transportation needs:    Medical: Not on file    Non-medical: Not on file  Tobacco Use  . Smoking status: Never Smoker  . Smokeless tobacco: Never Used  Substance and Sexual Activity  . Alcohol use: No  . Drug use: No  . Sexual activity: Not on file  Lifestyle  . Physical activity:    Days per week: Not on file    Minutes per session: Not on file  . Stress: Not on file  Relationships  . Social  connections:    Talks on phone: Not on file    Gets together: Not on file    Attends religious service: Not on file    Active member of club or organization: Not on file    Attends meetings of clubs or organizations: Not on file    Relationship status: Not on file  Other Topics Concern  . Not on file  Social History Narrative  . Not on file     Family History: The patient's family history includes Heart attack in her father; Stroke in her mother. ROS:   Please see the history of present illness.    All 14 point review of systems negative except as described per history of present illness  EKGs/Labs/Other Studies Reviewed:      Recent Labs: No results found for requested labs within last 8760 hours.  Recent Lipid Panel    Component Value Date/Time   CHOL 327 (H) 12/10/2015 0648   TRIG 151 (H) 12/10/2015 0648   HDL 60 12/10/2015 0648   CHOLHDL 5.5 12/10/2015 0648   VLDL 30 12/10/2015 0648   LDLCALC 237 (H) 12/10/2015 0648    Physical Exam:    VS:  BP 132/62   Pulse 63   Ht 5' 4.5" (1.638 m)   Wt 153 lb 6.4 oz (69.6 kg)   SpO2 93%   BMI 25.92 kg/m     Wt Readings from Last 3 Encounters:  08/06/18 153 lb 6.4 oz (69.6 kg)  05/17/17 153 lb (69.4 kg)  11/01/16 154 lb (69.9 kg)     GEN:  Well nourished, well developed in no acute distress HEENT: Normal NECK: No JVD; No carotid bruits LYMPHATICS: No lymphadenopathy CARDIAC: RRR, no murmurs, no rubs, no gallops RESPIRATORY:  Clear to auscultation without rales, wheezing or rhonchi  ABDOMEN: Soft, non-tender, non-distended MUSCULOSKELETAL:  No edema; No deformity  SKIN: Warm and dry LOWER EXTREMITIES: no swelling NEUROLOGIC:  Alert and oriented x 3 PSYCHIATRIC:  Normal affect   ASSESSMENT:    1. Atherosclerosis of coronary artery bypass graft of native heart without angina pectoris   2. Essential hypertension   3. Coronary artery disease involving coronary bypass graft of native heart with angina pectoris  ([place])   4. Peripheral vascular disease ([place])    PLAN:    In order of problems listed above:  1. Coronary artery disease status post coronary artery bypass graft doing well from that point review asymptomatic. 2. Essential hypertension blood pressure well controlled continue present management. 3. Peripheral vascular disease in form of cardiac arterial disease found to repeat carotid ultrasounds which I will do. 4. Dyslipidemia intolerance to statin again I initiated conversation about PCSK9 agent.  She refused she does  not want to take those.  She says she takes fish oil and she is happy with it I told her that her LDL is 190 which is unacceptably high.  She still does not want to do it obviously will continue this conversation. 5. Coronary disease.  Stable. 6. History of diastolic congestive heart failure.  Compensated and stable.   Medication Adjustments/Labs and Tests Ordered: Current medicines are reviewed at length with the patient today.  Concerns regarding medicines are outlined above.  No orders of the defined types were placed in this encounter.  Medication changes: No orders of the defined types were placed in this encounter.   Signed, Park Liter, MD, [facility] 08/06/2018 9:12 AM    [place]

## 2018-08-06 NOTE — Patient Instructions (Signed)
Medication Instructions:  Your physician recommends that you continue on your current medications as directed. Please refer to the Current Medication list given to you today.  If you need a refill on your cardiac medications before your next appointment, please call your pharmacy.   Lab work: None.  If you have labs (blood work) drawn today and your tests are completely normal, you will receive your results only by: Marland Kitchen MyChart Message (if you have MyChart) OR . A paper copy in the mail If you have any lab test that is abnormal or we need to change your treatment, we will call you to review the results.  Testing/Procedures: Your physician has requested that you have a carotid duplex. This test is an ultrasound of the carotid arteries in your neck. It looks at blood flow through these arteries that supply the brain with blood. Allow one hour for this exam. There are no restrictions or special instructions.    Follow-Up: At [facility], you and your health needs are our priority.  As part of our continuing mission to provide you with exceptional heart care, we have created designated Provider Care Teams.  These Care Teams include your primary Cardiologist (physician) and Advanced Practice Providers (APPs -  Physician Assistants and Nurse Practitioners) who all work together to provide you with the care you need, when you need it. You will need a follow up appointment in 4 months.  Please call our office 2 months in advance to schedule this appointment.  You may see Jenne Campus, MD or another member of our [place] Provider Team in [place]: Shirlee More, MD . Jyl Heinz, MD  Any Other Special Instructions Will Be Listed Below (If Applicable).

## 2018-08-06 NOTE — Addendum Note (Signed)
Addended by: Ashok Norris on: 08/06/2018 09:20 AM   Modules accepted: Orders

## 2018-09-18 ENCOUNTER — Ambulatory Visit (INDEPENDENT_AMBULATORY_CARE_PROVIDER_SITE_OTHER): Payer: Medicare Other

## 2018-09-18 DIAGNOSIS — I25709 Atherosclerosis of coronary artery bypass graft(s), unspecified, with unspecified angina pectoris: Secondary | ICD-10-CM

## 2018-09-18 DIAGNOSIS — I6523 Occlusion and stenosis of bilateral carotid arteries: Secondary | ICD-10-CM

## 2018-09-18 DIAGNOSIS — I25810 Atherosclerosis of coronary artery bypass graft(s) without angina pectoris: Secondary | ICD-10-CM | POA: Diagnosis not present

## 2018-09-18 NOTE — Progress Notes (Signed)
Carotid duplex exam has been performed, Bilateral ICA stenosis was evaluated and is in the 40-59% range.   Jimmy Jonnette Nuon, RDCS, RVT

## 2018-10-08 DIAGNOSIS — E782 Mixed hyperlipidemia: Secondary | ICD-10-CM | POA: Diagnosis not present

## 2018-10-08 DIAGNOSIS — E1121 Type 2 diabetes mellitus with diabetic nephropathy: Secondary | ICD-10-CM | POA: Diagnosis not present

## 2018-10-08 DIAGNOSIS — I10 Essential (primary) hypertension: Secondary | ICD-10-CM | POA: Diagnosis not present

## 2018-10-08 DIAGNOSIS — I110 Hypertensive heart disease with heart failure: Secondary | ICD-10-CM | POA: Diagnosis not present

## 2018-10-29 DIAGNOSIS — Z0000 Encounter for general adult medical examination without abnormal findings: Secondary | ICD-10-CM | POA: Diagnosis not present

## 2018-10-29 DIAGNOSIS — M810 Age-related osteoporosis without current pathological fracture: Secondary | ICD-10-CM | POA: Diagnosis not present

## 2018-11-02 DIAGNOSIS — I491 Atrial premature depolarization: Secondary | ICD-10-CM | POA: Diagnosis not present

## 2018-11-02 DIAGNOSIS — R51 Headache: Secondary | ICD-10-CM | POA: Diagnosis not present

## 2018-11-02 DIAGNOSIS — R079 Chest pain, unspecified: Secondary | ICD-10-CM | POA: Diagnosis not present

## 2018-11-02 DIAGNOSIS — R0781 Pleurodynia: Secondary | ICD-10-CM | POA: Diagnosis not present

## 2018-11-02 DIAGNOSIS — Z79899 Other long term (current) drug therapy: Secondary | ICD-10-CM | POA: Diagnosis not present

## 2018-11-02 DIAGNOSIS — I25810 Atherosclerosis of coronary artery bypass graft(s) without angina pectoris: Secondary | ICD-10-CM | POA: Diagnosis not present

## 2018-11-02 DIAGNOSIS — I252 Old myocardial infarction: Secondary | ICD-10-CM | POA: Diagnosis not present

## 2018-11-02 DIAGNOSIS — E785 Hyperlipidemia, unspecified: Secondary | ICD-10-CM

## 2018-11-02 DIAGNOSIS — E039 Hypothyroidism, unspecified: Secondary | ICD-10-CM | POA: Diagnosis not present

## 2018-11-02 DIAGNOSIS — R0602 Shortness of breath: Secondary | ICD-10-CM | POA: Diagnosis not present

## 2018-11-02 DIAGNOSIS — K449 Diaphragmatic hernia without obstruction or gangrene: Secondary | ICD-10-CM | POA: Diagnosis not present

## 2018-11-02 DIAGNOSIS — E119 Type 2 diabetes mellitus without complications: Secondary | ICD-10-CM | POA: Diagnosis not present

## 2018-11-02 DIAGNOSIS — R0789 Other chest pain: Secondary | ICD-10-CM | POA: Diagnosis not present

## 2018-11-02 DIAGNOSIS — I10 Essential (primary) hypertension: Secondary | ICD-10-CM | POA: Diagnosis not present

## 2018-11-02 DIAGNOSIS — F329 Major depressive disorder, single episode, unspecified: Secondary | ICD-10-CM

## 2018-11-02 DIAGNOSIS — Z8673 Personal history of transient ischemic attack (TIA), and cerebral infarction without residual deficits: Secondary | ICD-10-CM | POA: Diagnosis not present

## 2018-11-02 DIAGNOSIS — R531 Weakness: Secondary | ICD-10-CM | POA: Diagnosis not present

## 2018-11-02 DIAGNOSIS — R42 Dizziness and giddiness: Secondary | ICD-10-CM | POA: Diagnosis not present

## 2018-11-02 DIAGNOSIS — I2510 Atherosclerotic heart disease of native coronary artery without angina pectoris: Secondary | ICD-10-CM

## 2018-11-03 DIAGNOSIS — R0781 Pleurodynia: Secondary | ICD-10-CM | POA: Diagnosis not present

## 2018-11-03 DIAGNOSIS — Z8673 Personal history of transient ischemic attack (TIA), and cerebral infarction without residual deficits: Secondary | ICD-10-CM | POA: Diagnosis not present

## 2018-11-03 DIAGNOSIS — I10 Essential (primary) hypertension: Secondary | ICD-10-CM | POA: Diagnosis not present

## 2018-11-03 DIAGNOSIS — E039 Hypothyroidism, unspecified: Secondary | ICD-10-CM | POA: Diagnosis not present

## 2018-11-03 DIAGNOSIS — I2510 Atherosclerotic heart disease of native coronary artery without angina pectoris: Secondary | ICD-10-CM | POA: Diagnosis not present

## 2018-11-05 DIAGNOSIS — R0789 Other chest pain: Secondary | ICD-10-CM | POA: Diagnosis not present

## 2018-11-05 DIAGNOSIS — R5383 Other fatigue: Secondary | ICD-10-CM | POA: Diagnosis not present

## 2018-11-06 ENCOUNTER — Telehealth: Payer: Self-pay | Admitting: Emergency Medicine

## 2018-11-06 DIAGNOSIS — R012 Other cardiac sounds: Secondary | ICD-10-CM | POA: Diagnosis not present

## 2018-11-06 DIAGNOSIS — R001 Bradycardia, unspecified: Secondary | ICD-10-CM | POA: Diagnosis not present

## 2018-11-06 DIAGNOSIS — R5383 Other fatigue: Secondary | ICD-10-CM | POA: Diagnosis not present

## 2018-11-06 NOTE — Telephone Encounter (Signed)
Left message for patient to return call. Patient needs appointment next week per RJK.

## 2018-11-12 ENCOUNTER — Telehealth: Payer: Self-pay | Admitting: Emergency Medicine

## 2018-11-12 NOTE — Telephone Encounter (Signed)
Called patient and asked the screening questions for COVID 19. Patient denies being out of the country in the last 14 days. Patient denies being around anyone with COVID 19. Patient denies and cough, shortness of breath, or fever. Patient will be seen as scheduled tomorrow.

## 2018-11-13 ENCOUNTER — Encounter: Payer: Self-pay | Admitting: Cardiology

## 2018-11-13 ENCOUNTER — Other Ambulatory Visit: Payer: Self-pay

## 2018-11-13 ENCOUNTER — Ambulatory Visit (INDEPENDENT_AMBULATORY_CARE_PROVIDER_SITE_OTHER): Payer: Medicare Other

## 2018-11-13 ENCOUNTER — Ambulatory Visit (INDEPENDENT_AMBULATORY_CARE_PROVIDER_SITE_OTHER): Payer: Medicare Other | Admitting: Cardiology

## 2018-11-13 VITALS — BP 120/64 | Ht 64.5 in | Wt 154.0 lb

## 2018-11-13 DIAGNOSIS — I10 Essential (primary) hypertension: Secondary | ICD-10-CM

## 2018-11-13 DIAGNOSIS — R001 Bradycardia, unspecified: Secondary | ICD-10-CM

## 2018-11-13 DIAGNOSIS — I25709 Atherosclerosis of coronary artery bypass graft(s), unspecified, with unspecified angina pectoris: Secondary | ICD-10-CM | POA: Diagnosis not present

## 2018-11-13 DIAGNOSIS — I25810 Atherosclerosis of coronary artery bypass graft(s) without angina pectoris: Secondary | ICD-10-CM | POA: Diagnosis not present

## 2018-11-13 DIAGNOSIS — R002 Palpitations: Secondary | ICD-10-CM | POA: Diagnosis not present

## 2018-11-13 NOTE — Progress Notes (Signed)
Cardiology Office Note:    Date:  11/13/2018   ID:  [name], DOB [birth year], MRN [id number]  PCP:  Rochel Brome, MD  Cardiologist:  Jenne Campus, MD    Referring MD: Rochel Brome, MD   Chief Complaint  Patient presents with  . Hospitalization Follow-up    Heart rate 43 at Dr. Tobie Poet office  And feeling weak and tired  History of Present Illness:    [name] is a 85 y.o. female with cardiomyopathy ejection fraction 40% at time of cardiac catheterization, depression, anemia comes today to my office for follow-up recently she was in the hospital reason for that visit was weakness fatigue and some atypical chest pain.  She ruled out for myocardial infarction she was discharged home.  Since that time she still complain of being weak and tired exhausted and matter-of-fact when I walk into the room she is simply laying down the back.  Denies having any passing out spells.  EKG done today showed sinus rhythm with premature ventricular contractions, right bundle branch block.  Past Medical History:  Diagnosis Date  . Anemia    hx  . Arthritis   . CAD (coronary artery disease)   . Cardiomyopathy, ischemic    EF 40% at the time cath  . Depression   . Diabetes mellitus without complication (HCC)    borderline no meds  . Dyslipidemia   . GERD (gastroesophageal reflux disease)   . HTN (hypertension)   . Myocardial infarction (HCC)    mild  . Stroke ([place])   . Urinary frequency     Past Surgical History:  Procedure Laterality Date  . APPENDECTOMY    . CESAREAN SECTION     x2  . CORONARY ARTERY BYPASS GRAFT     LIMA to the LAD, SVG to diagonal, SVG to obtuse marginal, SVG to PDA..  2008  . LUMBAR LAMINECTOMY/DECOMPRESSION MICRODISCECTOMY N/A 05/01/2013   Procedure: LUMBAR LAMINECTOMY/DECOMPRESSION MICRODISCECTOMY 2 LEVELS;  Surgeon: Winfield Cunas, MD;  Location: [facility];  Service: Neurosurgery;  Laterality: N/A;  Lumbar Three-Four, Four-Five Laminectomies   .  LUMBAR LAMINECTOMY/DECOMPRESSION MICRODISCECTOMY N/A 07/14/2014   Procedure: LUMBAR LAMINECTOMY/DECOMPRESSION MICRODISCECTOMY 1 LEVEL LUMBAR TWO-THREE;  Surgeon: Ashok Pall, MD;  Location: [place] [facility];  Service: Neurosurgery;  Laterality: N/A;  . SHOULDER ARTHROSCOPY W/ ROTATOR CUFF REPAIR Right    04  . TONSILLECTOMY      Current Medications: Current Meds  Medication Sig  . amLODipine (NORVASC) 5 MG tablet Take 5 mg by mouth daily.  Marland Kitchen aspirin EC 81 MG tablet Take 81 mg by mouth daily.  . Cholecalciferol (VITAMIN D3) 25 MCG (1000 UT) CAPS Take 1 capsule by mouth daily.  . fish oil-omega-3 fatty acids 1000 MG capsule Take 1 g by mouth daily.   . furosemide (LASIX) 20 MG tablet Take 20 mg by mouth 2 (two) times daily as needed for fluid.   Marland Kitchen levothyroxine (SYNTHROID, LEVOTHROID) 50 MCG tablet Take 1 tablet by mouth daily.  Marland Kitchen lisinopril (PRINIVIL,ZESTRIL) 40 MG tablet Take 40 mg by mouth daily.   Marland Kitchen LORazepam (ATIVAN) 0.5 MG tablet Take 0.5 mg by mouth at bedtime.  . sertraline (ZOLOFT) 100 MG tablet Take 100 mg by mouth at bedtime.     Allergies:   Atorvastatin; Statins; Amlodipine; Crestor [rosuvastatin calcium]; Pravastatin; Simvastatin; and Penicillins   Social History   Socioeconomic History  . Marital status: Married    Spouse name: Not on file  . Number of children:  Not on file  . Years of education: Not on file  . Highest education level: Not on file  Occupational History  . Not on file  Social Needs  . Financial resource strain: Not on file  . Food insecurity:    Worry: Not on file    Inability: Not on file  . Transportation needs:    Medical: Not on file    Non-medical: Not on file  Tobacco Use  . Smoking status: Never Smoker  . Smokeless tobacco: Never Used  Substance and Sexual Activity  . Alcohol use: No  . Drug use: No  . Sexual activity: Not on file  Lifestyle  . Physical activity:    Days per week: Not on file    Minutes per session: Not on file  .  Stress: Not on file  Relationships  . Social connections:    Talks on phone: Not on file    Gets together: Not on file    Attends religious service: Not on file    Active member of club or organization: Not on file    Attends meetings of clubs or organizations: Not on file    Relationship status: Not on file  Other Topics Concern  . Not on file  Social History Narrative  . Not on file     Family History: The patient's family history includes Heart attack in her father; Stroke in her mother. ROS:   Please see the history of present illness.    All 14 point review of systems negative except as described per history of present illness  EKGs/Labs/Other Studies Reviewed:      Recent Labs: No results found for requested labs within last 8760 hours.  Recent Lipid Panel    Component Value Date/Time   CHOL 327 (H) 12/10/2015 0648   TRIG 151 (H) 12/10/2015 0648   HDL 60 12/10/2015 0648   CHOLHDL 5.5 12/10/2015 0648   VLDL 30 12/10/2015 0648   LDLCALC 237 (H) 12/10/2015 0648    Physical Exam:    VS:  BP 120/64   Ht 5' 4.5" (1.638 m)   Wt 154 lb (69.9 kg)   SpO2 95%   BMI 26.03 kg/m     Wt Readings from Last 3 Encounters:  11/13/18 154 lb (69.9 kg)  08/06/18 153 lb 6.4 oz (69.6 kg)  05/17/17 153 lb (69.4 kg)     GEN:  Well nourished, well developed in no acute distress HEENT: Normal NECK: No JVD; No carotid bruits LYMPHATICS: No lymphadenopathy CARDIAC: RRR, no murmurs, no rubs, no gallops RESPIRATORY:  Clear to auscultation without rales, wheezing or rhonchi  ABDOMEN: Soft, non-tender, non-distended MUSCULOSKELETAL:  No edema; No deformity  SKIN: Warm and dry LOWER EXTREMITIES: no swelling NEUROLOGIC:  Alert and oriented x 3 PSYCHIATRIC:  Normal affect   ASSESSMENT:    1. Palpitations   2. Coronary artery disease involving coronary bypass graft of native heart with angina pectoris ([place])   3. Atherosclerosis of coronary artery bypass graft of native heart  without angina pectoris   4. Essential hypertension   5. Bradycardia    PLAN:    In order of problems listed above:  1. Palpitations/PVCs.  I suspect this is the reason for her feeling weak and tired will put monitor on her to see how much of this extra beats she has.  And then will initiate therapy probably the easiest way to trying to treat this will be to add beta-blocker however with her bradycardia that  may be problematic again putting monitor on her health will help [place] answer this question. 2. I also will contact primary care physician to see if she get potassium and magnesium checked recently. 3. Essential hypertension blood pressure appears to be controlled continue present management. 4. Bradycardia plan as outlined above I will put monitor on her to see exactly how slow heart rate goes on top of that well see how much ectopy she has before we initiate therapy   Medication Adjustments/Labs and Tests Ordered: Current medicines are reviewed at length with the patient today.  Concerns regarding medicines are outlined above.  Orders Placed This Encounter  Procedures  . LONG TERM MONITOR (3-14 DAYS)   Medication changes: No orders of the defined types were placed in this encounter.   Signed, Park Liter, MD, [facility] 11/13/2018 11:31 AM    [place]

## 2018-11-13 NOTE — Patient Instructions (Signed)
Medication Instructions:  .isntcur  If you need a refill on your cardiac medications before your next appointment, please call your pharmacy.   Lab work: None.  If you have labs (blood work) drawn today and your tests are completely normal, you will receive your results only by: Marland Kitchen MyChart Message (if you have MyChart) OR . A paper copy in the mail If you have any lab test that is abnormal or we need to change your treatment, we will call you to review the results.  Testing/Procedures: Your physician has recommended that you wear a holter monitor. Holter monitors are medical devices that record the heart's electrical activity. Doctors most often use these monitors to diagnose arrhythmias. Arrhythmias are problems with the speed or rhythm of the heartbeat. The monitor is a small, portable device. You can wear one while you do your normal daily activities. This is usually used to diagnose what is causing palpitations/syncope (passing out). Wear for 7 days     Follow-Up: At [facility], you and your health needs are our priority.  As part of our continuing mission to provide you with exceptional heart care, we have created designated Provider Care Teams.  These Care Teams include your primary Cardiologist (physician) and Advanced Practice Providers (APPs -  Physician Assistants and Nurse Practitioners) who all work together to provide you with the care you need, when you need it. You will need a follow up appointment in 3 months.  Please call our office 2 months in advance to schedule this appointment.  You may see Jenne Campus, MD or another member of our [place] Provider Team in [place]: Shirlee More, MD . Jyl Heinz, MD  Any Other Special Instructions Will Be Listed Below (If Applicable).

## 2018-11-20 DIAGNOSIS — R002 Palpitations: Secondary | ICD-10-CM | POA: Diagnosis not present

## 2018-12-04 ENCOUNTER — Ambulatory Visit: Payer: Medicare Other | Admitting: Cardiology

## 2018-12-05 ENCOUNTER — Ambulatory Visit: Payer: Medicare Other | Admitting: Cardiology

## 2019-01-07 DIAGNOSIS — E1121 Type 2 diabetes mellitus with diabetic nephropathy: Secondary | ICD-10-CM | POA: Diagnosis not present

## 2019-01-07 DIAGNOSIS — Z1159 Encounter for screening for other viral diseases: Secondary | ICD-10-CM | POA: Diagnosis not present

## 2019-01-07 DIAGNOSIS — I110 Hypertensive heart disease with heart failure: Secondary | ICD-10-CM | POA: Diagnosis not present

## 2019-01-07 DIAGNOSIS — I10 Essential (primary) hypertension: Secondary | ICD-10-CM | POA: Diagnosis not present

## 2019-01-07 DIAGNOSIS — E782 Mixed hyperlipidemia: Secondary | ICD-10-CM | POA: Diagnosis not present

## 2019-01-26 ENCOUNTER — Telehealth: Payer: Self-pay | Admitting: Cardiology

## 2019-01-26 NOTE — Telephone Encounter (Signed)
Virtual Visit Pre-Appointment Phone Call  "(Name), I am calling you today to discuss your upcoming appointment. We are currently trying to limit exposure to the virus that causes COVID-19 by seeing patients at home rather than in the office."  1. "What is the BEST phone number to call the day of the visit?" - include this in appointment notes  2. Do you have or have access to (through a family member/friend) a smartphone with video capability that we can use for your visit?" a. If yes - list this number in appt notes as cell (if different from BEST phone #) and list the appointment type as a VIDEO visit in appointment notes b. If no - list the appointment type as a PHONE visit in appointment notes  3. Confirm consent - "In the setting of the current Covid19 crisis, you are scheduled for a (phone or video) visit with your provider on (date) at (time).  Just as we do with many in-office visits, in order for you to participate in this visit, we must obtain consent.  If you'd like, I can send this to your mychart (if signed up) or email for you to review.  Otherwise, I can obtain your verbal consent now.  All virtual visits are billed to your insurance company just like a normal visit would be.  By agreeing to a virtual visit, we'd like you to understand that the technology does not allow for your provider to perform an examination, and thus may limit your provider's ability to fully assess your condition. If your provider identifies any concerns that need to be evaluated in person, we will make arrangements to do so.  Finally, though the technology is pretty good, we cannot assure that it will always work on either your or our end, and in the setting of a video visit, we may have to convert it to a phone-only visit.  In either situation, we cannot ensure that we have a secure connection.  Are you willing to proceed?" STAFF: Did the patient verbally acknowledge consent to telehealth visit? Document  YES/NO here: YES  4. Advise patient to be prepared - "Two hours prior to your appointment, go ahead and check your blood pressure, pulse, oxygen saturation, and your weight (if you have the equipment to check those) and write them all down. When your visit starts, your provider will ask you for this information. If you have an Apple Watch or Kardia device, please plan to have heart rate information ready on the day of your appointment. Please have a pen and paper handy nearby the day of the visit as well."  5. Give patient instructions for MyChart download to smartphone OR Doximity/Doxy.me as below if video visit (depending on what platform provider is using)  6. Inform patient they will receive a phone call 15 minutes prior to their appointment time (may be from unknown caller ID) so they should be prepared to answer    TELEPHONE CALL NOTE  GLYNDA SOLIDAY has been deemed a candidate for a follow-up tele-health visit to limit community exposure during the Covid-19 pandemic. I spoke with the patient via phone to ensure availability of phone/video source, confirm preferred email & phone number, and discuss instructions and expectations.  I reminded [name] to be prepared with any vital sign and/or heart rhythm information that could potentially be obtained via home monitoring, at the time of her visit. I reminded MAAME DACK to expect a phone call prior to  her visit.  Isaiah Blakes 01/26/2019 10:23 AM   INSTRUCTIONS FOR DOWNLOADING THE MYCHART APP TO SMARTPHONE  - The patient must first make sure to have activated MyChart and know their login information - If Apple, go to [employer] and type in MyChart in the search bar and download the app. If Android, ask patient to go to [employer] and type in [place] in the search bar and download the app. The app is free but as with any other app downloads, their phone may require them to verify saved payment information or Apple/Android  password.  - The patient will need to then log into the app with their MyChart username and password, and select [facility] as their healthcare provider to link the account. When it is time for your visit, go to the MyChart app, find appointments, and click Begin Video Visit. Be sure to Select Allow for your device to access the Microphone and Camera for your visit. You will then be connected, and your provider will be with you shortly.  **If they have any issues connecting, or need assistance please contact MyChart service desk [phone])**  **If using a computer, in order to ensure the best quality for their visit they will need to use either of the following Internet Browsers: [employer], or Google Chrome**  IF USING DOXIMITY or DOXY.ME - The patient will receive a link just prior to their visit by text.     FULL LENGTH CONSENT FOR TELE-HEALTH VISIT   I hereby voluntarily request, consent and authorize [facility] and its employed or contracted physicians, physician assistants, nurse practitioners or other licensed health care professionals (the Practitioner), to provide me with telemedicine health care services (the Services") as deemed necessary by the treating Practitioner. I acknowledge and consent to receive the Services by the Practitioner via telemedicine. I understand that the telemedicine visit will involve communicating with the Practitioner through live audiovisual communication technology and the disclosure of certain medical information by electronic transmission. I acknowledge that I have been given the opportunity to request an in-person assessment or other available alternative prior to the telemedicine visit and am voluntarily participating in the telemedicine visit.  I understand that I have the right to withhold or withdraw my consent to the use of telemedicine in the course of my care at any time, without affecting my right to future care or treatment,  and that the Practitioner or I may terminate the telemedicine visit at any time. I understand that I have the right to inspect all information obtained and/or recorded in the course of the telemedicine visit and may receive copies of available information for a reasonable fee.  I understand that some of the potential risks of receiving the Services via telemedicine include:   Delay or interruption in medical evaluation due to technological equipment failure or disruption;  Information transmitted may not be sufficient (e.g. poor resolution of images) to allow for appropriate medical decision making by the Practitioner; and/or   In rare instances, security protocols could fail, causing a breach of personal health information.  Furthermore, I acknowledge that it is my responsibility to provide information about my medical history, conditions and care that is complete and accurate to the best of my ability. I acknowledge that Practitioner's advice, recommendations, and/or decision may be based on factors not within their control, such as incomplete or inaccurate data provided by me or distortions of diagnostic images or specimens that may result from electronic transmissions. I  understand that the practice of medicine is not an exact science and that Practitioner makes no warranties or guarantees regarding treatment outcomes. I acknowledge that I will receive a copy of this consent concurrently upon execution via email to the email address I last provided but may also request a printed copy by calling the office of [facility] [facility].    I understand that my insurance will be billed for this visit.   I have read or had this consent read to me.  I understand the contents of this consent, which adequately explains the benefits and risks of the Services being provided via telemedicine.   I have been provided ample opportunity to ask questions regarding this consent and the Services and have had my questions  answered to my satisfaction.  I give my informed consent for the services to be provided through the use of telemedicine in my medical care  By participating in this telemedicine visit I agree to the above.

## 2019-02-13 ENCOUNTER — Other Ambulatory Visit: Payer: Self-pay

## 2019-02-13 ENCOUNTER — Telehealth: Payer: Medicare Other | Admitting: Cardiology

## 2019-02-13 ENCOUNTER — Telehealth: Payer: Self-pay | Admitting: Cardiology

## 2019-02-13 NOTE — Telephone Encounter (Signed)
Virtual Visit Pre-Appointment Phone Call  "(Name), I am calling you today to discuss your upcoming appointment. We are currently trying to limit exposure to the virus that causes COVID-19 by seeing patients at home rather than in the office."  1. "What is the BEST phone number to call the day of the visit?" - include this in appointment notes  2. Do you have or have access to (through a family member/friend) a smartphone with video capability that we can use for your visit?" a. If yes - list this number in appt notes as cell (if different from BEST phone #) and list the appointment type as a VIDEO visit in appointment notes b. If no - list the appointment type as a PHONE visit in appointment notes  Confirm consent - "In the setting of the current Covid19 crisis, you are scheduled for a (phone or video) visit with your provider on (date) at (time).  Just as we do with many in-office visits, in order for you to participate in this visit, we must obtain consent.  If you'd like, I can send this to your mychart (if signed up) or email for you to review.  Otherwise, I can obtain your verbal consent now.  All virtual visits are billed to your insurance company just like a normal visit would be.  By agreeing to a virtual visit, we'd like you to understand that the technology does not allow for your provider to perform an examination, and thus may limit your provider's ability to fully assess your condition. If your provider identifies any concerns that need to be evaluated in person, we will make arrangements to do so.  Finally, though the technology is pretty good, we cannot assure that it will always work on either your or our end, and in the setting of a video visit, we may have to convert it to a phone-only visit.  In either situation, we cannot ensure that we have a secure connection.  Are you willing to proceed?" STAFF: Did the patient verbally acknowledge consent to telehealth visit? Document  YES/NO here: yes 3. Advise patient to be prepared - "Two hours prior to your appointment, go ahead and check your blood pressure, pulse, oxygen saturation, and your weight (if you have the equipment to check those) and write them all down. When your visit starts, your provider will ask you for this information. If you have an Apple Watch or Kardia device, please plan to have heart rate information ready on the day of your appointment. Please have a pen and paper handy nearby the day of the visit as well."  4. Give patient instructions for MyChart download to smartphone OR Doximity/Doxy.me as below if video visit (depending on what platform provider is using)  5. Inform patient they will receive a phone call 15 minutes prior to their appointment time (may be from unknown caller ID) so they should be prepared to answer    TELEPHONE CALL NOTE  [name] has been deemed a candidate for a follow-up tele-health visit to limit community exposure during the Covid-19 pandemic. I spoke with the patient via phone to ensure availability of phone/video source, confirm preferred email & phone number, and discuss instructions and expectations.  I reminded SHAWNESE MAGNER to be prepared with any vital sign and/or heart rhythm information that could potentially be obtained via home monitoring, at the time of her visit. I reminded KIFFANY SCHELLING to expect a phone call prior to her visit.  Janine Limbo 02/13/2019 11:51 AM   INSTRUCTIONS FOR DOWNLOADING THE MYCHART APP TO SMARTPHONE  - The patient must first make sure to have activated MyChart and know their login information - If Apple, go to [employer] and type in MyChart in the search bar and download the app. If Android, ask patient to go to [employer] and type in [place] in the search bar and download the app. The app is free but as with any other app downloads, their phone may require them to verify saved payment information or Apple/Android  password.  - The patient will need to then log into the app with their MyChart username and password, and select [facility] as their healthcare provider to link the account. When it is time for your visit, go to the MyChart app, find appointments, and click Begin Video Visit. Be sure to Select Allow for your device to access the Microphone and Camera for your visit. You will then be connected, and your provider will be with you shortly.  **If they have any issues connecting, or need assistance please contact MyChart service desk [phone])**  **If using a computer, in order to ensure the best quality for their visit they will need to use either of the following Internet Browsers: [employer], or Google Chrome**  IF USING DOXIMITY or DOXY.ME - The patient will receive a link just prior to their visit by text.     FULL LENGTH CONSENT FOR TELE-HEALTH VISIT   I hereby voluntarily request, consent and authorize [facility] and its employed or contracted physicians, physician assistants, nurse practitioners or other licensed health care professionals (the Practitioner), to provide me with telemedicine health care services (the Services") as deemed necessary by the treating Practitioner. I acknowledge and consent to receive the Services by the Practitioner via telemedicine. I understand that the telemedicine visit will involve communicating with the Practitioner through live audiovisual communication technology and the disclosure of certain medical information by electronic transmission. I acknowledge that I have been given the opportunity to request an in-person assessment or other available alternative prior to the telemedicine visit and am voluntarily participating in the telemedicine visit.  I understand that I have the right to withhold or withdraw my consent to the use of telemedicine in the course of my care at any time, without affecting my right to future care or treatment,  and that the Practitioner or I may terminate the telemedicine visit at any time. I understand that I have the right to inspect all information obtained and/or recorded in the course of the telemedicine visit and may receive copies of available information for a reasonable fee.  I understand that some of the potential risks of receiving the Services via telemedicine include:   Delay or interruption in medical evaluation due to technological equipment failure or disruption;  Information transmitted may not be sufficient (e.g. poor resolution of images) to allow for appropriate medical decision making by the Practitioner; and/or   In rare instances, security protocols could fail, causing a breach of personal health information.  Furthermore, I acknowledge that it is my responsibility to provide information about my medical history, conditions and care that is complete and accurate to the best of my ability. I acknowledge that Practitioner's advice, recommendations, and/or decision may be based on factors not within their control, such as incomplete or inaccurate data provided by me or distortions of diagnostic images or specimens that may result from electronic transmissions. I understand that the practice  of medicine is not an Chief Strategy Officer and that Practitioner makes no warranties or guarantees regarding treatment outcomes. I acknowledge that I will receive a copy of this consent concurrently upon execution via email to the email address I last provided but may also request a printed copy by calling the office of [facility].    I understand that my insurance will be billed for this visit.   I have read or had this consent read to me.  I understand the contents of this consent, which adequately explains the benefits and risks of the Services being provided via telemedicine.   I have been provided ample opportunity to ask questions regarding this consent and the Services and have had my questions  answered to my satisfaction.  I give my informed consent for the services to be provided through the use of telemedicine in my medical care  By participating in this telemedicine visit I agree to the above.

## 2019-02-16 ENCOUNTER — Telehealth (INDEPENDENT_AMBULATORY_CARE_PROVIDER_SITE_OTHER): Payer: Medicare Other | Admitting: Cardiology

## 2019-02-16 ENCOUNTER — Other Ambulatory Visit: Payer: Self-pay

## 2019-02-16 ENCOUNTER — Encounter: Payer: Self-pay | Admitting: Cardiology

## 2019-02-16 ENCOUNTER — Telehealth: Payer: Self-pay | Admitting: Emergency Medicine

## 2019-02-16 VITALS — BP 157/65 | HR 63 | Wt 150.0 lb

## 2019-02-16 DIAGNOSIS — I25709 Atherosclerosis of coronary artery bypass graft(s), unspecified, with unspecified angina pectoris: Secondary | ICD-10-CM

## 2019-02-16 DIAGNOSIS — I10 Essential (primary) hypertension: Secondary | ICD-10-CM | POA: Diagnosis not present

## 2019-02-16 DIAGNOSIS — R001 Bradycardia, unspecified: Secondary | ICD-10-CM | POA: Diagnosis not present

## 2019-02-16 NOTE — Patient Instructions (Signed)
Medication Instructions:  Your physician recommends that you continue on your current medications as directed. Please refer to the Current Medication list given to you today.  If you need a refill on your cardiac medications before your next appointment, please call your pharmacy.   Lab work: None.  If you have labs (blood work) drawn today and your tests are completely normal, you will receive your results only by: . MyChart Message (if you have MyChart) OR . A paper copy in the mail If you have any lab test that is abnormal or we need to change your treatment, we will call you to review the results.  Testing/Procedures: None.   Follow-Up: At [facility], you and your health needs are our priority.  As part of our continuing mission to provide you with exceptional heart care, we have created designated Provider Care Teams.  These Care Teams include your primary Cardiologist (physician) and Advanced Practice Providers (APPs -  Physician Assistants and Nurse Practitioners) who all work together to provide you with the care you need, when you need it. You will need a follow up appointment in 4 months.  Please call our office 2 months in advance to schedule this appointment.  You may see Robert Krasowski, MD or another member of our [facility] Provider Team in [place]: Brian Munley, MD . Rajan Revankar, MD  Any Other Special Instructions Will Be Listed Below (If Applicable).     

## 2019-02-16 NOTE — Telephone Encounter (Signed)
Scheduled pt for Oct. 22nd with Dr. Raliegh Ip

## 2019-02-16 NOTE — Progress Notes (Signed)
Virtual Visit via Telephone Note   This visit type was conducted due to national recommendations for restrictions regarding the COVID-19 Pandemic (e.g. social distancing) in an effort to limit this patient's exposure and mitigate transmission in our community.  Due to her co-morbid illnesses, this patient is at least at moderate risk for complications without adequate follow up.  This format is felt to be most appropriate for this patient at this time.  The patient did not have access to video technology/had technical difficulties with video requiring transitioning to audio format only (telephone).  All issues noted in this document were discussed and addressed.  No physical exam could be performed with this format.  Please refer to the patient's chart for her  consent to telehealth for [facility].  Evaluation Performed:  Follow-up visit  This visit type was conducted due to national recommendations for restrictions regarding the COVID-19 Pandemic (e.g. social distancing).  This format is felt to be most appropriate for this patient at this time.  All issues noted in this document were discussed and addressed.  No physical exam was performed (except for noted visual exam findings with Video Visits).  Please refer to the patient's chart (MyChart message for video visits and phone note for telephone visits) for the patient's consent to telehealth for [facility].  Date:  02/16/2019  ID: [name], DOB 1926-11-07, MRN [id number]   Patient Location: [place] [address]   Provider location:   [place] Office  PCP:  Rochel Brome, MD  Cardiologist:  Jenne Campus, MD     Chief Complaint: Doing well  History of Present Illness:    [name] is a 85 y.o. female  who presents via audio/video conferencing for a telehealth visit today.  Elderly woman with coronary artery disease diabetes history of ischemic cardiomyopathy ejection fraction 40%  overall she is doing fairly she complained of having good days and bad days and good days she is very tired and exhausted.  Denies having any chest pain tightness squeezing pressure burning in the chest   The patient does not have symptoms concerning for COVID-19 infection (fever, chills, cough, or new SHORTNESS OF BREATH).    Prior CV studies:   The following studies were reviewed today:  Long-term Holter monitor showed few short lasting episode of narrow complex tachycardia.  No significant bradycardia identified  Cardiac ultrasound done in January showed  Summary: Right Carotid: Velocities in the right ICA are consistent with a 40-59%                stenosis.  Left Carotid: Velocities in the left ICA are consistent with a 40-59% stenosis.  Vertebrals:  Bilateral vertebral arteries demonstrate antegrade flow. Subclavians: Normal flow hemodynamics were seen in bilateral subclavian              arteries.    Past Medical History:  Diagnosis Date  . Anemia    hx  . Arthritis   . CAD (coronary artery disease)   . Cardiomyopathy, ischemic    EF 40% at the time cath  . Depression   . Diabetes mellitus without complication (HCC)    borderline no meds  . Dyslipidemia   . GERD (gastroesophageal reflux disease)   . HTN (hypertension)   . Myocardial infarction (HCC)    mild  . Stroke ([place])   . Urinary frequency     Past Surgical History:  Procedure Laterality Date  . APPENDECTOMY    .  CESAREAN SECTION     x2  . CORONARY ARTERY BYPASS GRAFT     LIMA to the LAD, SVG to diagonal, SVG to obtuse marginal, SVG to PDA..  2008  . LUMBAR LAMINECTOMY/DECOMPRESSION MICRODISCECTOMY N/A 05/01/2013   Procedure: LUMBAR LAMINECTOMY/DECOMPRESSION MICRODISCECTOMY 2 LEVELS;  Surgeon: Winfield Cunas, MD;  Location: [facility];  Service: Neurosurgery;  Laterality: N/A;  Lumbar Three-Four, Four-Five Laminectomies   . LUMBAR LAMINECTOMY/DECOMPRESSION MICRODISCECTOMY N/A 07/14/2014    Procedure: LUMBAR LAMINECTOMY/DECOMPRESSION MICRODISCECTOMY 1 LEVEL LUMBAR TWO-THREE;  Surgeon: Ashok Pall, MD;  Location: [facility];  Service: Neurosurgery;  Laterality: N/A;  . SHOULDER ARTHROSCOPY W/ ROTATOR CUFF REPAIR Right    04  . TONSILLECTOMY       Current Meds  Medication Sig  . amLODipine (NORVASC) 5 MG tablet Take 5 mg by mouth daily.  Marland Kitchen aspirin EC 81 MG tablet Take 81 mg by mouth daily.  . Cholecalciferol (VITAMIN D3) 25 MCG (1000 UT) CAPS Take 1 capsule by mouth daily.  . fish oil-omega-3 fatty acids 1000 MG capsule Take 1 g by mouth daily.   . furosemide (LASIX) 20 MG tablet Take 20 mg by mouth 2 (two) times daily as needed for fluid.   Marland Kitchen levothyroxine (SYNTHROID, LEVOTHROID) 50 MCG tablet Take 1 tablet by mouth daily.  Marland Kitchen lisinopril (PRINIVIL,ZESTRIL) 40 MG tablet Take 40 mg by mouth daily.   Marland Kitchen LORazepam (ATIVAN) 0.5 MG tablet Take 0.5 mg by mouth at bedtime.  . sertraline (ZOLOFT) 100 MG tablet Take 100 mg by mouth at bedtime.      Family History: The patient's family history includes Heart attack in her father; Stroke in her mother.   ROS:   Please see the history of present illness.     All other systems reviewed and are negative.   Labs/Other Tests and Data Reviewed:     Recent Labs: No results found for requested labs within last 8760 hours.  Recent Lipid Panel    Component Value Date/Time   CHOL 327 (H) 12/10/2015 0648   TRIG 151 (H) 12/10/2015 0648   HDL 60 12/10/2015 0648   CHOLHDL 5.5 12/10/2015 0648   VLDL 30 12/10/2015 0648   LDLCALC 237 (H) 12/10/2015 0648      Exam:    Vital Signs:  BP (!) 157/65   Pulse 63   Wt 150 lb (68 kg)   BMI 25.35 kg/m     Wt Readings from Last 3 Encounters:  02/16/19 150 lb (68 kg)  11/13/18 154 lb (69.9 kg)  08/06/18 153 lb 6.4 oz (69.6 kg)     Well nourished, well developed in no acute distress. Alert awake and x3 not in distress  Diagnosis for this visit:   1. Essential hypertension   2.  Coronary artery disease involving coronary bypass graft of native heart with angina pectoris ([place])   3. Bradycardia      ASSESSMENT & PLAN:    1.  Essential hypertension blood pressure appears to be well controlled 2.  Coronary artery disease stable on appropriate medications 3.  Bradycardia denies having a dizziness event recorder did not show any critical bradycardia continue monitoring 4.  Peripheral vascular disease with carotic arterial disease noncritical we will repeat the test in 6 months.  COVID-19 Education: The signs and symptoms of COVID-19 were discussed with the patient and how to seek care for testing (follow up with PCP or arrange E-visit).  The importance of social distancing was discussed today.  Patient  Risk:   After full review of this patients clinical status, I feel that they are at least moderate risk at this time.  Time:   Today, I have spent 15 minutes with the patient with telehealth technology discussing pt health issues.  I spent 5 minutes reviewing her chart before the visit.  Visit was finished at 11:45 AM.    Medication Adjustments/Labs and Tests Ordered: Current medicines are reviewed at length with the patient today.  Concerns regarding medicines are outlined above.  No orders of the defined types were placed in this encounter.  Medication changes: No orders of the defined types were placed in this encounter.    Disposition: Follow-up 3 months  Signed, Park Liter, MD, [facility] 02/16/2019 1:10 PM    [place]

## 2019-02-16 NOTE — Telephone Encounter (Signed)
Left message for patient to return call to be scheduled for follow up appointment.

## 2019-03-20 DIAGNOSIS — M25551 Pain in right hip: Secondary | ICD-10-CM | POA: Diagnosis not present

## 2019-03-20 DIAGNOSIS — M1611 Unilateral primary osteoarthritis, right hip: Secondary | ICD-10-CM | POA: Diagnosis not present

## 2019-03-20 DIAGNOSIS — M545 Low back pain: Secondary | ICD-10-CM | POA: Diagnosis not present

## 2019-03-21 DIAGNOSIS — M545 Low back pain: Secondary | ICD-10-CM | POA: Diagnosis not present

## 2019-03-21 DIAGNOSIS — N3001 Acute cystitis with hematuria: Secondary | ICD-10-CM | POA: Diagnosis not present

## 2019-03-23 DIAGNOSIS — N3000 Acute cystitis without hematuria: Secondary | ICD-10-CM | POA: Diagnosis not present

## 2019-03-23 DIAGNOSIS — M545 Low back pain: Secondary | ICD-10-CM | POA: Diagnosis not present

## 2019-03-27 DIAGNOSIS — N3001 Acute cystitis with hematuria: Secondary | ICD-10-CM | POA: Diagnosis not present

## 2019-03-27 DIAGNOSIS — M6281 Muscle weakness (generalized): Secondary | ICD-10-CM | POA: Diagnosis not present

## 2019-03-27 DIAGNOSIS — R54 Age-related physical debility: Secondary | ICD-10-CM | POA: Diagnosis not present

## 2019-03-28 DIAGNOSIS — M25551 Pain in right hip: Secondary | ICD-10-CM | POA: Diagnosis not present

## 2019-03-28 DIAGNOSIS — E1151 Type 2 diabetes mellitus with diabetic peripheral angiopathy without gangrene: Secondary | ICD-10-CM | POA: Diagnosis not present

## 2019-03-28 DIAGNOSIS — R531 Weakness: Secondary | ICD-10-CM | POA: Diagnosis not present

## 2019-03-28 DIAGNOSIS — I69351 Hemiplegia and hemiparesis following cerebral infarction affecting right dominant side: Secondary | ICD-10-CM | POA: Diagnosis not present

## 2019-03-28 DIAGNOSIS — Z87891 Personal history of nicotine dependence: Secondary | ICD-10-CM | POA: Diagnosis not present

## 2019-03-28 DIAGNOSIS — E034 Atrophy of thyroid (acquired): Secondary | ICD-10-CM | POA: Diagnosis not present

## 2019-03-28 DIAGNOSIS — I252 Old myocardial infarction: Secondary | ICD-10-CM | POA: Diagnosis not present

## 2019-03-28 DIAGNOSIS — M4802 Spinal stenosis, cervical region: Secondary | ICD-10-CM | POA: Diagnosis not present

## 2019-03-28 DIAGNOSIS — I110 Hypertensive heart disease with heart failure: Secondary | ICD-10-CM | POA: Diagnosis not present

## 2019-03-28 DIAGNOSIS — I255 Ischemic cardiomyopathy: Secondary | ICD-10-CM | POA: Diagnosis not present

## 2019-03-28 DIAGNOSIS — Z792 Long term (current) use of antibiotics: Secondary | ICD-10-CM | POA: Diagnosis not present

## 2019-03-28 DIAGNOSIS — Z951 Presence of aortocoronary bypass graft: Secondary | ICD-10-CM | POA: Diagnosis not present

## 2019-03-28 DIAGNOSIS — I69328 Other speech and language deficits following cerebral infarction: Secondary | ICD-10-CM | POA: Diagnosis not present

## 2019-03-28 DIAGNOSIS — Z79899 Other long term (current) drug therapy: Secondary | ICD-10-CM | POA: Diagnosis not present

## 2019-03-28 DIAGNOSIS — M48061 Spinal stenosis, lumbar region without neurogenic claudication: Secondary | ICD-10-CM | POA: Diagnosis not present

## 2019-03-28 DIAGNOSIS — R5383 Other fatigue: Secondary | ICD-10-CM | POA: Diagnosis not present

## 2019-03-28 DIAGNOSIS — G5603 Carpal tunnel syndrome, bilateral upper limbs: Secondary | ICD-10-CM | POA: Diagnosis not present

## 2019-03-28 DIAGNOSIS — E782 Mixed hyperlipidemia: Secondary | ICD-10-CM | POA: Diagnosis not present

## 2019-03-28 DIAGNOSIS — I2510 Atherosclerotic heart disease of native coronary artery without angina pectoris: Secondary | ICD-10-CM | POA: Diagnosis not present

## 2019-03-28 DIAGNOSIS — M6281 Muscle weakness (generalized): Secondary | ICD-10-CM | POA: Diagnosis not present

## 2019-03-28 DIAGNOSIS — R001 Bradycardia, unspecified: Secondary | ICD-10-CM | POA: Diagnosis not present

## 2019-03-28 DIAGNOSIS — F5101 Primary insomnia: Secondary | ICD-10-CM | POA: Diagnosis not present

## 2019-03-28 DIAGNOSIS — N3001 Acute cystitis with hematuria: Secondary | ICD-10-CM | POA: Diagnosis not present

## 2019-03-28 DIAGNOSIS — R54 Age-related physical debility: Secondary | ICD-10-CM | POA: Diagnosis not present

## 2019-04-01 DIAGNOSIS — I69351 Hemiplegia and hemiparesis following cerebral infarction affecting right dominant side: Secondary | ICD-10-CM | POA: Diagnosis not present

## 2019-04-01 DIAGNOSIS — R531 Weakness: Secondary | ICD-10-CM | POA: Diagnosis not present

## 2019-04-01 DIAGNOSIS — Z79899 Other long term (current) drug therapy: Secondary | ICD-10-CM | POA: Diagnosis not present

## 2019-04-01 DIAGNOSIS — Z792 Long term (current) use of antibiotics: Secondary | ICD-10-CM | POA: Diagnosis not present

## 2019-04-01 DIAGNOSIS — M4802 Spinal stenosis, cervical region: Secondary | ICD-10-CM | POA: Diagnosis not present

## 2019-04-01 DIAGNOSIS — I110 Hypertensive heart disease with heart failure: Secondary | ICD-10-CM | POA: Diagnosis not present

## 2019-04-01 DIAGNOSIS — M48061 Spinal stenosis, lumbar region without neurogenic claudication: Secondary | ICD-10-CM | POA: Diagnosis not present

## 2019-04-01 DIAGNOSIS — I69328 Other speech and language deficits following cerebral infarction: Secondary | ICD-10-CM | POA: Diagnosis not present

## 2019-04-01 DIAGNOSIS — Z951 Presence of aortocoronary bypass graft: Secondary | ICD-10-CM | POA: Diagnosis not present

## 2019-04-01 DIAGNOSIS — Z87891 Personal history of nicotine dependence: Secondary | ICD-10-CM | POA: Diagnosis not present

## 2019-04-01 DIAGNOSIS — E782 Mixed hyperlipidemia: Secondary | ICD-10-CM | POA: Diagnosis not present

## 2019-04-01 DIAGNOSIS — I255 Ischemic cardiomyopathy: Secondary | ICD-10-CM | POA: Diagnosis not present

## 2019-04-01 DIAGNOSIS — M6281 Muscle weakness (generalized): Secondary | ICD-10-CM | POA: Diagnosis not present

## 2019-04-01 DIAGNOSIS — I2510 Atherosclerotic heart disease of native coronary artery without angina pectoris: Secondary | ICD-10-CM | POA: Diagnosis not present

## 2019-04-01 DIAGNOSIS — N3001 Acute cystitis with hematuria: Secondary | ICD-10-CM | POA: Diagnosis not present

## 2019-04-01 DIAGNOSIS — R54 Age-related physical debility: Secondary | ICD-10-CM | POA: Diagnosis not present

## 2019-04-01 DIAGNOSIS — M25551 Pain in right hip: Secondary | ICD-10-CM | POA: Diagnosis not present

## 2019-04-01 DIAGNOSIS — F5101 Primary insomnia: Secondary | ICD-10-CM | POA: Diagnosis not present

## 2019-04-01 DIAGNOSIS — I252 Old myocardial infarction: Secondary | ICD-10-CM | POA: Diagnosis not present

## 2019-04-01 DIAGNOSIS — E1151 Type 2 diabetes mellitus with diabetic peripheral angiopathy without gangrene: Secondary | ICD-10-CM | POA: Diagnosis not present

## 2019-04-01 DIAGNOSIS — R001 Bradycardia, unspecified: Secondary | ICD-10-CM | POA: Diagnosis not present

## 2019-04-01 DIAGNOSIS — E034 Atrophy of thyroid (acquired): Secondary | ICD-10-CM | POA: Diagnosis not present

## 2019-04-01 DIAGNOSIS — R5383 Other fatigue: Secondary | ICD-10-CM | POA: Diagnosis not present

## 2019-04-01 DIAGNOSIS — G5603 Carpal tunnel syndrome, bilateral upper limbs: Secondary | ICD-10-CM | POA: Diagnosis not present

## 2019-04-02 DIAGNOSIS — E1151 Type 2 diabetes mellitus with diabetic peripheral angiopathy without gangrene: Secondary | ICD-10-CM | POA: Diagnosis not present

## 2019-04-02 DIAGNOSIS — I110 Hypertensive heart disease with heart failure: Secondary | ICD-10-CM | POA: Diagnosis not present

## 2019-04-02 DIAGNOSIS — N3001 Acute cystitis with hematuria: Secondary | ICD-10-CM | POA: Diagnosis not present

## 2019-04-02 DIAGNOSIS — I69351 Hemiplegia and hemiparesis following cerebral infarction affecting right dominant side: Secondary | ICD-10-CM | POA: Diagnosis not present

## 2019-04-08 DIAGNOSIS — E1151 Type 2 diabetes mellitus with diabetic peripheral angiopathy without gangrene: Secondary | ICD-10-CM | POA: Diagnosis not present

## 2019-04-08 DIAGNOSIS — G5603 Carpal tunnel syndrome, bilateral upper limbs: Secondary | ICD-10-CM | POA: Diagnosis not present

## 2019-04-08 DIAGNOSIS — I69328 Other speech and language deficits following cerebral infarction: Secondary | ICD-10-CM | POA: Diagnosis not present

## 2019-04-08 DIAGNOSIS — M48061 Spinal stenosis, lumbar region without neurogenic claudication: Secondary | ICD-10-CM | POA: Diagnosis not present

## 2019-04-08 DIAGNOSIS — N3001 Acute cystitis with hematuria: Secondary | ICD-10-CM | POA: Diagnosis not present

## 2019-04-08 DIAGNOSIS — R5383 Other fatigue: Secondary | ICD-10-CM | POA: Diagnosis not present

## 2019-04-08 DIAGNOSIS — F5101 Primary insomnia: Secondary | ICD-10-CM | POA: Diagnosis not present

## 2019-04-08 DIAGNOSIS — Z79899 Other long term (current) drug therapy: Secondary | ICD-10-CM | POA: Diagnosis not present

## 2019-04-08 DIAGNOSIS — E782 Mixed hyperlipidemia: Secondary | ICD-10-CM | POA: Diagnosis not present

## 2019-04-08 DIAGNOSIS — Z87891 Personal history of nicotine dependence: Secondary | ICD-10-CM | POA: Diagnosis not present

## 2019-04-08 DIAGNOSIS — Z951 Presence of aortocoronary bypass graft: Secondary | ICD-10-CM | POA: Diagnosis not present

## 2019-04-08 DIAGNOSIS — I110 Hypertensive heart disease with heart failure: Secondary | ICD-10-CM | POA: Diagnosis not present

## 2019-04-08 DIAGNOSIS — I252 Old myocardial infarction: Secondary | ICD-10-CM | POA: Diagnosis not present

## 2019-04-08 DIAGNOSIS — I69351 Hemiplegia and hemiparesis following cerebral infarction affecting right dominant side: Secondary | ICD-10-CM | POA: Diagnosis not present

## 2019-04-08 DIAGNOSIS — I255 Ischemic cardiomyopathy: Secondary | ICD-10-CM | POA: Diagnosis not present

## 2019-04-08 DIAGNOSIS — R001 Bradycardia, unspecified: Secondary | ICD-10-CM | POA: Diagnosis not present

## 2019-04-08 DIAGNOSIS — Z792 Long term (current) use of antibiotics: Secondary | ICD-10-CM | POA: Diagnosis not present

## 2019-04-08 DIAGNOSIS — M4802 Spinal stenosis, cervical region: Secondary | ICD-10-CM | POA: Diagnosis not present

## 2019-04-08 DIAGNOSIS — R531 Weakness: Secondary | ICD-10-CM | POA: Diagnosis not present

## 2019-04-08 DIAGNOSIS — I2510 Atherosclerotic heart disease of native coronary artery without angina pectoris: Secondary | ICD-10-CM | POA: Diagnosis not present

## 2019-04-08 DIAGNOSIS — M6281 Muscle weakness (generalized): Secondary | ICD-10-CM | POA: Diagnosis not present

## 2019-04-08 DIAGNOSIS — E034 Atrophy of thyroid (acquired): Secondary | ICD-10-CM | POA: Diagnosis not present

## 2019-04-08 DIAGNOSIS — R54 Age-related physical debility: Secondary | ICD-10-CM | POA: Diagnosis not present

## 2019-04-08 DIAGNOSIS — M25551 Pain in right hip: Secondary | ICD-10-CM | POA: Diagnosis not present

## 2019-04-10 DIAGNOSIS — I69351 Hemiplegia and hemiparesis following cerebral infarction affecting right dominant side: Secondary | ICD-10-CM | POA: Diagnosis not present

## 2019-04-10 DIAGNOSIS — M4802 Spinal stenosis, cervical region: Secondary | ICD-10-CM | POA: Diagnosis not present

## 2019-04-10 DIAGNOSIS — R5383 Other fatigue: Secondary | ICD-10-CM | POA: Diagnosis not present

## 2019-04-10 DIAGNOSIS — I110 Hypertensive heart disease with heart failure: Secondary | ICD-10-CM | POA: Diagnosis not present

## 2019-04-10 DIAGNOSIS — G5603 Carpal tunnel syndrome, bilateral upper limbs: Secondary | ICD-10-CM | POA: Diagnosis not present

## 2019-04-10 DIAGNOSIS — I255 Ischemic cardiomyopathy: Secondary | ICD-10-CM | POA: Diagnosis not present

## 2019-04-10 DIAGNOSIS — F5101 Primary insomnia: Secondary | ICD-10-CM | POA: Diagnosis not present

## 2019-04-10 DIAGNOSIS — I252 Old myocardial infarction: Secondary | ICD-10-CM | POA: Diagnosis not present

## 2019-04-10 DIAGNOSIS — Z79899 Other long term (current) drug therapy: Secondary | ICD-10-CM | POA: Diagnosis not present

## 2019-04-10 DIAGNOSIS — I69328 Other speech and language deficits following cerebral infarction: Secondary | ICD-10-CM | POA: Diagnosis not present

## 2019-04-10 DIAGNOSIS — I2510 Atherosclerotic heart disease of native coronary artery without angina pectoris: Secondary | ICD-10-CM | POA: Diagnosis not present

## 2019-04-10 DIAGNOSIS — E034 Atrophy of thyroid (acquired): Secondary | ICD-10-CM | POA: Diagnosis not present

## 2019-04-10 DIAGNOSIS — M48061 Spinal stenosis, lumbar region without neurogenic claudication: Secondary | ICD-10-CM | POA: Diagnosis not present

## 2019-04-10 DIAGNOSIS — M25551 Pain in right hip: Secondary | ICD-10-CM | POA: Diagnosis not present

## 2019-04-10 DIAGNOSIS — Z792 Long term (current) use of antibiotics: Secondary | ICD-10-CM | POA: Diagnosis not present

## 2019-04-10 DIAGNOSIS — Z951 Presence of aortocoronary bypass graft: Secondary | ICD-10-CM | POA: Diagnosis not present

## 2019-04-10 DIAGNOSIS — N3001 Acute cystitis with hematuria: Secondary | ICD-10-CM | POA: Diagnosis not present

## 2019-04-10 DIAGNOSIS — E782 Mixed hyperlipidemia: Secondary | ICD-10-CM | POA: Diagnosis not present

## 2019-04-10 DIAGNOSIS — M6281 Muscle weakness (generalized): Secondary | ICD-10-CM | POA: Diagnosis not present

## 2019-04-10 DIAGNOSIS — R001 Bradycardia, unspecified: Secondary | ICD-10-CM | POA: Diagnosis not present

## 2019-04-10 DIAGNOSIS — Z87891 Personal history of nicotine dependence: Secondary | ICD-10-CM | POA: Diagnosis not present

## 2019-04-10 DIAGNOSIS — R54 Age-related physical debility: Secondary | ICD-10-CM | POA: Diagnosis not present

## 2019-04-10 DIAGNOSIS — E1151 Type 2 diabetes mellitus with diabetic peripheral angiopathy without gangrene: Secondary | ICD-10-CM | POA: Diagnosis not present

## 2019-04-10 DIAGNOSIS — R531 Weakness: Secondary | ICD-10-CM | POA: Diagnosis not present

## 2019-04-14 DIAGNOSIS — E1121 Type 2 diabetes mellitus with diabetic nephropathy: Secondary | ICD-10-CM | POA: Diagnosis not present

## 2019-04-14 DIAGNOSIS — I10 Essential (primary) hypertension: Secondary | ICD-10-CM | POA: Diagnosis not present

## 2019-04-14 DIAGNOSIS — E782 Mixed hyperlipidemia: Secondary | ICD-10-CM | POA: Diagnosis not present

## 2019-04-14 DIAGNOSIS — I110 Hypertensive heart disease with heart failure: Secondary | ICD-10-CM | POA: Diagnosis not present

## 2019-04-15 DIAGNOSIS — M4802 Spinal stenosis, cervical region: Secondary | ICD-10-CM | POA: Diagnosis not present

## 2019-04-15 DIAGNOSIS — R54 Age-related physical debility: Secondary | ICD-10-CM | POA: Diagnosis not present

## 2019-04-15 DIAGNOSIS — I69351 Hemiplegia and hemiparesis following cerebral infarction affecting right dominant side: Secondary | ICD-10-CM | POA: Diagnosis not present

## 2019-04-15 DIAGNOSIS — R531 Weakness: Secondary | ICD-10-CM | POA: Diagnosis not present

## 2019-04-15 DIAGNOSIS — E782 Mixed hyperlipidemia: Secondary | ICD-10-CM | POA: Diagnosis not present

## 2019-04-15 DIAGNOSIS — I2510 Atherosclerotic heart disease of native coronary artery without angina pectoris: Secondary | ICD-10-CM | POA: Diagnosis not present

## 2019-04-15 DIAGNOSIS — F5101 Primary insomnia: Secondary | ICD-10-CM | POA: Diagnosis not present

## 2019-04-15 DIAGNOSIS — E034 Atrophy of thyroid (acquired): Secondary | ICD-10-CM | POA: Diagnosis not present

## 2019-04-15 DIAGNOSIS — Z951 Presence of aortocoronary bypass graft: Secondary | ICD-10-CM | POA: Diagnosis not present

## 2019-04-15 DIAGNOSIS — R5383 Other fatigue: Secondary | ICD-10-CM | POA: Diagnosis not present

## 2019-04-15 DIAGNOSIS — I252 Old myocardial infarction: Secondary | ICD-10-CM | POA: Diagnosis not present

## 2019-04-15 DIAGNOSIS — Z792 Long term (current) use of antibiotics: Secondary | ICD-10-CM | POA: Diagnosis not present

## 2019-04-15 DIAGNOSIS — M48061 Spinal stenosis, lumbar region without neurogenic claudication: Secondary | ICD-10-CM | POA: Diagnosis not present

## 2019-04-15 DIAGNOSIS — N3001 Acute cystitis with hematuria: Secondary | ICD-10-CM | POA: Diagnosis not present

## 2019-04-15 DIAGNOSIS — M6281 Muscle weakness (generalized): Secondary | ICD-10-CM | POA: Diagnosis not present

## 2019-04-15 DIAGNOSIS — M25551 Pain in right hip: Secondary | ICD-10-CM | POA: Diagnosis not present

## 2019-04-15 DIAGNOSIS — I110 Hypertensive heart disease with heart failure: Secondary | ICD-10-CM | POA: Diagnosis not present

## 2019-04-15 DIAGNOSIS — Z79899 Other long term (current) drug therapy: Secondary | ICD-10-CM | POA: Diagnosis not present

## 2019-04-15 DIAGNOSIS — I69328 Other speech and language deficits following cerebral infarction: Secondary | ICD-10-CM | POA: Diagnosis not present

## 2019-04-15 DIAGNOSIS — Z87891 Personal history of nicotine dependence: Secondary | ICD-10-CM | POA: Diagnosis not present

## 2019-04-15 DIAGNOSIS — I255 Ischemic cardiomyopathy: Secondary | ICD-10-CM | POA: Diagnosis not present

## 2019-04-15 DIAGNOSIS — R001 Bradycardia, unspecified: Secondary | ICD-10-CM | POA: Diagnosis not present

## 2019-04-15 DIAGNOSIS — G5603 Carpal tunnel syndrome, bilateral upper limbs: Secondary | ICD-10-CM | POA: Diagnosis not present

## 2019-04-15 DIAGNOSIS — E1151 Type 2 diabetes mellitus with diabetic peripheral angiopathy without gangrene: Secondary | ICD-10-CM | POA: Diagnosis not present

## 2019-04-17 DIAGNOSIS — R54 Age-related physical debility: Secondary | ICD-10-CM | POA: Diagnosis not present

## 2019-04-17 DIAGNOSIS — I110 Hypertensive heart disease with heart failure: Secondary | ICD-10-CM | POA: Diagnosis not present

## 2019-04-17 DIAGNOSIS — E1151 Type 2 diabetes mellitus with diabetic peripheral angiopathy without gangrene: Secondary | ICD-10-CM | POA: Diagnosis not present

## 2019-04-17 DIAGNOSIS — E038 Other specified hypothyroidism: Secondary | ICD-10-CM | POA: Diagnosis not present

## 2019-04-17 DIAGNOSIS — N3001 Acute cystitis with hematuria: Secondary | ICD-10-CM | POA: Diagnosis not present

## 2019-04-17 DIAGNOSIS — E1121 Type 2 diabetes mellitus with diabetic nephropathy: Secondary | ICD-10-CM | POA: Diagnosis not present

## 2019-04-17 DIAGNOSIS — E782 Mixed hyperlipidemia: Secondary | ICD-10-CM | POA: Diagnosis not present

## 2019-04-17 DIAGNOSIS — I252 Old myocardial infarction: Secondary | ICD-10-CM | POA: Diagnosis not present

## 2019-04-17 DIAGNOSIS — I255 Ischemic cardiomyopathy: Secondary | ICD-10-CM | POA: Diagnosis not present

## 2019-04-17 DIAGNOSIS — R5383 Other fatigue: Secondary | ICD-10-CM | POA: Diagnosis not present

## 2019-04-17 DIAGNOSIS — I69351 Hemiplegia and hemiparesis following cerebral infarction affecting right dominant side: Secondary | ICD-10-CM | POA: Diagnosis not present

## 2019-04-17 DIAGNOSIS — I2510 Atherosclerotic heart disease of native coronary artery without angina pectoris: Secondary | ICD-10-CM | POA: Diagnosis not present

## 2019-04-17 DIAGNOSIS — R531 Weakness: Secondary | ICD-10-CM | POA: Diagnosis not present

## 2019-04-17 DIAGNOSIS — M6281 Muscle weakness (generalized): Secondary | ICD-10-CM | POA: Diagnosis not present

## 2019-04-17 DIAGNOSIS — F5101 Primary insomnia: Secondary | ICD-10-CM | POA: Diagnosis not present

## 2019-04-17 DIAGNOSIS — Z792 Long term (current) use of antibiotics: Secondary | ICD-10-CM | POA: Diagnosis not present

## 2019-04-17 DIAGNOSIS — Z79899 Other long term (current) drug therapy: Secondary | ICD-10-CM | POA: Diagnosis not present

## 2019-04-17 DIAGNOSIS — Z87891 Personal history of nicotine dependence: Secondary | ICD-10-CM | POA: Diagnosis not present

## 2019-04-17 DIAGNOSIS — M4802 Spinal stenosis, cervical region: Secondary | ICD-10-CM | POA: Diagnosis not present

## 2019-04-17 DIAGNOSIS — I69328 Other speech and language deficits following cerebral infarction: Secondary | ICD-10-CM | POA: Diagnosis not present

## 2019-04-17 DIAGNOSIS — G5603 Carpal tunnel syndrome, bilateral upper limbs: Secondary | ICD-10-CM | POA: Diagnosis not present

## 2019-04-17 DIAGNOSIS — M25551 Pain in right hip: Secondary | ICD-10-CM | POA: Diagnosis not present

## 2019-04-17 DIAGNOSIS — R001 Bradycardia, unspecified: Secondary | ICD-10-CM | POA: Diagnosis not present

## 2019-04-17 DIAGNOSIS — E034 Atrophy of thyroid (acquired): Secondary | ICD-10-CM | POA: Diagnosis not present

## 2019-04-17 DIAGNOSIS — M48061 Spinal stenosis, lumbar region without neurogenic claudication: Secondary | ICD-10-CM | POA: Diagnosis not present

## 2019-04-17 DIAGNOSIS — Z951 Presence of aortocoronary bypass graft: Secondary | ICD-10-CM | POA: Diagnosis not present

## 2019-04-20 ENCOUNTER — Telehealth: Payer: Self-pay | Admitting: Cardiology

## 2019-04-20 ENCOUNTER — Encounter: Payer: Self-pay | Admitting: Cardiology

## 2019-04-20 DIAGNOSIS — I255 Ischemic cardiomyopathy: Secondary | ICD-10-CM | POA: Diagnosis not present

## 2019-04-20 DIAGNOSIS — I69351 Hemiplegia and hemiparesis following cerebral infarction affecting right dominant side: Secondary | ICD-10-CM | POA: Diagnosis not present

## 2019-04-20 DIAGNOSIS — N3001 Acute cystitis with hematuria: Secondary | ICD-10-CM | POA: Diagnosis not present

## 2019-04-20 DIAGNOSIS — R5383 Other fatigue: Secondary | ICD-10-CM | POA: Diagnosis not present

## 2019-04-20 DIAGNOSIS — M6281 Muscle weakness (generalized): Secondary | ICD-10-CM | POA: Diagnosis not present

## 2019-04-20 DIAGNOSIS — M48061 Spinal stenosis, lumbar region without neurogenic claudication: Secondary | ICD-10-CM | POA: Diagnosis not present

## 2019-04-20 DIAGNOSIS — I25709 Atherosclerosis of coronary artery bypass graft(s), unspecified, with unspecified angina pectoris: Secondary | ICD-10-CM

## 2019-04-20 DIAGNOSIS — E785 Hyperlipidemia, unspecified: Secondary | ICD-10-CM

## 2019-04-20 DIAGNOSIS — G5603 Carpal tunnel syndrome, bilateral upper limbs: Secondary | ICD-10-CM | POA: Diagnosis not present

## 2019-04-20 DIAGNOSIS — R001 Bradycardia, unspecified: Secondary | ICD-10-CM | POA: Diagnosis not present

## 2019-04-20 DIAGNOSIS — Z792 Long term (current) use of antibiotics: Secondary | ICD-10-CM | POA: Diagnosis not present

## 2019-04-20 DIAGNOSIS — Z951 Presence of aortocoronary bypass graft: Secondary | ICD-10-CM | POA: Diagnosis not present

## 2019-04-20 DIAGNOSIS — F5101 Primary insomnia: Secondary | ICD-10-CM | POA: Diagnosis not present

## 2019-04-20 DIAGNOSIS — R54 Age-related physical debility: Secondary | ICD-10-CM | POA: Diagnosis not present

## 2019-04-20 DIAGNOSIS — I252 Old myocardial infarction: Secondary | ICD-10-CM | POA: Diagnosis not present

## 2019-04-20 DIAGNOSIS — R531 Weakness: Secondary | ICD-10-CM | POA: Diagnosis not present

## 2019-04-20 DIAGNOSIS — Z79899 Other long term (current) drug therapy: Secondary | ICD-10-CM | POA: Diagnosis not present

## 2019-04-20 DIAGNOSIS — M4802 Spinal stenosis, cervical region: Secondary | ICD-10-CM | POA: Diagnosis not present

## 2019-04-20 DIAGNOSIS — I69328 Other speech and language deficits following cerebral infarction: Secondary | ICD-10-CM | POA: Diagnosis not present

## 2019-04-20 DIAGNOSIS — M25551 Pain in right hip: Secondary | ICD-10-CM | POA: Diagnosis not present

## 2019-04-20 DIAGNOSIS — E034 Atrophy of thyroid (acquired): Secondary | ICD-10-CM | POA: Diagnosis not present

## 2019-04-20 DIAGNOSIS — I2510 Atherosclerotic heart disease of native coronary artery without angina pectoris: Secondary | ICD-10-CM | POA: Diagnosis not present

## 2019-04-20 DIAGNOSIS — Z87891 Personal history of nicotine dependence: Secondary | ICD-10-CM | POA: Diagnosis not present

## 2019-04-20 DIAGNOSIS — I110 Hypertensive heart disease with heart failure: Secondary | ICD-10-CM | POA: Diagnosis not present

## 2019-04-20 DIAGNOSIS — E782 Mixed hyperlipidemia: Secondary | ICD-10-CM | POA: Diagnosis not present

## 2019-04-20 DIAGNOSIS — E1151 Type 2 diabetes mellitus with diabetic peripheral angiopathy without gangrene: Secondary | ICD-10-CM | POA: Diagnosis not present

## 2019-04-20 NOTE — Telephone Encounter (Signed)
Patient called and states that Dr Tobie Poet drew her labs and shtat she needs to be on a statin, however they make her hurt really bad.. Please call her

## 2019-04-21 ENCOUNTER — Encounter: Payer: Self-pay | Admitting: *Deleted

## 2019-04-21 NOTE — Telephone Encounter (Signed)
Most recent lab work has been requested from Dr. Alyse Low office. Will have Dr. Bettina Gavia review and advise when received.

## 2019-04-23 DIAGNOSIS — Z79899 Other long term (current) drug therapy: Secondary | ICD-10-CM | POA: Diagnosis not present

## 2019-04-23 DIAGNOSIS — I255 Ischemic cardiomyopathy: Secondary | ICD-10-CM | POA: Diagnosis not present

## 2019-04-23 DIAGNOSIS — F5101 Primary insomnia: Secondary | ICD-10-CM | POA: Diagnosis not present

## 2019-04-23 DIAGNOSIS — E1151 Type 2 diabetes mellitus with diabetic peripheral angiopathy without gangrene: Secondary | ICD-10-CM | POA: Diagnosis not present

## 2019-04-23 DIAGNOSIS — R001 Bradycardia, unspecified: Secondary | ICD-10-CM | POA: Diagnosis not present

## 2019-04-23 DIAGNOSIS — Z792 Long term (current) use of antibiotics: Secondary | ICD-10-CM | POA: Diagnosis not present

## 2019-04-23 DIAGNOSIS — R54 Age-related physical debility: Secondary | ICD-10-CM | POA: Diagnosis not present

## 2019-04-23 DIAGNOSIS — I252 Old myocardial infarction: Secondary | ICD-10-CM | POA: Diagnosis not present

## 2019-04-23 DIAGNOSIS — M25551 Pain in right hip: Secondary | ICD-10-CM | POA: Diagnosis not present

## 2019-04-23 DIAGNOSIS — E034 Atrophy of thyroid (acquired): Secondary | ICD-10-CM | POA: Diagnosis not present

## 2019-04-23 DIAGNOSIS — Z951 Presence of aortocoronary bypass graft: Secondary | ICD-10-CM | POA: Diagnosis not present

## 2019-04-23 DIAGNOSIS — M6281 Muscle weakness (generalized): Secondary | ICD-10-CM | POA: Diagnosis not present

## 2019-04-23 DIAGNOSIS — M4802 Spinal stenosis, cervical region: Secondary | ICD-10-CM | POA: Diagnosis not present

## 2019-04-23 DIAGNOSIS — I2510 Atherosclerotic heart disease of native coronary artery without angina pectoris: Secondary | ICD-10-CM | POA: Diagnosis not present

## 2019-04-23 DIAGNOSIS — R5383 Other fatigue: Secondary | ICD-10-CM | POA: Diagnosis not present

## 2019-04-23 DIAGNOSIS — I69328 Other speech and language deficits following cerebral infarction: Secondary | ICD-10-CM | POA: Diagnosis not present

## 2019-04-23 DIAGNOSIS — I110 Hypertensive heart disease with heart failure: Secondary | ICD-10-CM | POA: Diagnosis not present

## 2019-04-23 DIAGNOSIS — G5603 Carpal tunnel syndrome, bilateral upper limbs: Secondary | ICD-10-CM | POA: Diagnosis not present

## 2019-04-23 DIAGNOSIS — R531 Weakness: Secondary | ICD-10-CM | POA: Diagnosis not present

## 2019-04-23 DIAGNOSIS — M48061 Spinal stenosis, lumbar region without neurogenic claudication: Secondary | ICD-10-CM | POA: Diagnosis not present

## 2019-04-23 DIAGNOSIS — N3001 Acute cystitis with hematuria: Secondary | ICD-10-CM | POA: Diagnosis not present

## 2019-04-23 DIAGNOSIS — I69351 Hemiplegia and hemiparesis following cerebral infarction affecting right dominant side: Secondary | ICD-10-CM | POA: Diagnosis not present

## 2019-04-23 DIAGNOSIS — Z87891 Personal history of nicotine dependence: Secondary | ICD-10-CM | POA: Diagnosis not present

## 2019-04-23 DIAGNOSIS — E782 Mixed hyperlipidemia: Secondary | ICD-10-CM | POA: Diagnosis not present

## 2019-04-27 DIAGNOSIS — R001 Bradycardia, unspecified: Secondary | ICD-10-CM | POA: Diagnosis not present

## 2019-04-27 DIAGNOSIS — R531 Weakness: Secondary | ICD-10-CM | POA: Diagnosis not present

## 2019-04-27 DIAGNOSIS — I110 Hypertensive heart disease with heart failure: Secondary | ICD-10-CM | POA: Diagnosis not present

## 2019-04-27 DIAGNOSIS — M6281 Muscle weakness (generalized): Secondary | ICD-10-CM | POA: Diagnosis not present

## 2019-04-27 DIAGNOSIS — E782 Mixed hyperlipidemia: Secondary | ICD-10-CM | POA: Diagnosis not present

## 2019-04-27 DIAGNOSIS — Z792 Long term (current) use of antibiotics: Secondary | ICD-10-CM | POA: Diagnosis not present

## 2019-04-27 DIAGNOSIS — M4802 Spinal stenosis, cervical region: Secondary | ICD-10-CM | POA: Diagnosis not present

## 2019-04-27 DIAGNOSIS — E1151 Type 2 diabetes mellitus with diabetic peripheral angiopathy without gangrene: Secondary | ICD-10-CM | POA: Diagnosis not present

## 2019-04-27 DIAGNOSIS — M25551 Pain in right hip: Secondary | ICD-10-CM | POA: Diagnosis not present

## 2019-04-27 DIAGNOSIS — F5101 Primary insomnia: Secondary | ICD-10-CM | POA: Diagnosis not present

## 2019-04-27 DIAGNOSIS — I69328 Other speech and language deficits following cerebral infarction: Secondary | ICD-10-CM | POA: Diagnosis not present

## 2019-04-27 DIAGNOSIS — I255 Ischemic cardiomyopathy: Secondary | ICD-10-CM | POA: Diagnosis not present

## 2019-04-27 DIAGNOSIS — Z87891 Personal history of nicotine dependence: Secondary | ICD-10-CM | POA: Diagnosis not present

## 2019-04-27 DIAGNOSIS — I252 Old myocardial infarction: Secondary | ICD-10-CM | POA: Diagnosis not present

## 2019-04-27 DIAGNOSIS — G5603 Carpal tunnel syndrome, bilateral upper limbs: Secondary | ICD-10-CM | POA: Diagnosis not present

## 2019-04-27 DIAGNOSIS — N3001 Acute cystitis with hematuria: Secondary | ICD-10-CM | POA: Diagnosis not present

## 2019-04-27 DIAGNOSIS — I2510 Atherosclerotic heart disease of native coronary artery without angina pectoris: Secondary | ICD-10-CM | POA: Diagnosis not present

## 2019-04-27 DIAGNOSIS — Z951 Presence of aortocoronary bypass graft: Secondary | ICD-10-CM | POA: Diagnosis not present

## 2019-04-27 DIAGNOSIS — E034 Atrophy of thyroid (acquired): Secondary | ICD-10-CM | POA: Diagnosis not present

## 2019-04-27 DIAGNOSIS — M48061 Spinal stenosis, lumbar region without neurogenic claudication: Secondary | ICD-10-CM | POA: Diagnosis not present

## 2019-04-27 DIAGNOSIS — R5383 Other fatigue: Secondary | ICD-10-CM | POA: Diagnosis not present

## 2019-04-27 DIAGNOSIS — R54 Age-related physical debility: Secondary | ICD-10-CM | POA: Diagnosis not present

## 2019-04-27 DIAGNOSIS — I69351 Hemiplegia and hemiparesis following cerebral infarction affecting right dominant side: Secondary | ICD-10-CM | POA: Diagnosis not present

## 2019-04-27 DIAGNOSIS — Z79899 Other long term (current) drug therapy: Secondary | ICD-10-CM | POA: Diagnosis not present

## 2019-04-28 ENCOUNTER — Telehealth: Payer: Self-pay | Admitting: Cardiology

## 2019-04-28 MED ORDER — REPATHA SURECLICK 140 MG/ML ~~LOC~~ SOAJ: 140.0000 mg | SUBCUTANEOUS | 1 refills | Status: DC

## 2019-04-28 NOTE — Telephone Encounter (Signed)
Called patients granddaughter and informed her of Dr. Wendy Poet recommendation per dpr. She verbally understands and will have patient start repatha 140 mg every 14 days. She will also have patient come to get labs rechecked in 6 weeks fasting. No further questions.

## 2019-04-28 NOTE — Telephone Encounter (Signed)
Yes, we need to start repatha, 140 mg every 2 weeks and flp in 6 weeks

## 2019-04-28 NOTE — Telephone Encounter (Signed)
Dr Tobie Poet office called and wanted to let us know that Daughter ok'd patient starting on repatha

## 2019-04-28 NOTE — Addendum Note (Signed)
Addended by: Ashok Norris on: 04/28/2019 10:22 AM   Modules accepted: Orders

## 2019-04-29 ENCOUNTER — Telehealth: Payer: Self-pay | Admitting: Emergency Medicine

## 2019-04-29 NOTE — Telephone Encounter (Signed)
Left message for patient to return call regarding prior authorization for Repatha.

## 2019-04-29 NOTE — Telephone Encounter (Signed)
Patient daughter already informed to start repatha. Currently working on prior authorization.

## 2019-04-29 NOTE — Telephone Encounter (Signed)
Patient's daughter called back and reports patient has taken statins before but is allergic. She is unsure of which ones she has taken they have asked we call [facility].

## 2019-04-30 NOTE — Telephone Encounter (Signed)
Left message with [facility] to return call.

## 2019-05-01 NOTE — Telephone Encounter (Signed)
Left message for patient to return call to inform her prior authorization for repatha has been submitted.

## 2019-05-01 NOTE — Telephone Encounter (Signed)
Patient's daughter called back.

## 2019-05-01 NOTE — Telephone Encounter (Signed)
Called patient's daughter back and informed her that the prior authorization has been sent for the patient's repatha. She verbally understood. No further questions.

## 2019-05-08 DIAGNOSIS — M6281 Muscle weakness (generalized): Secondary | ICD-10-CM | POA: Diagnosis not present

## 2019-05-08 DIAGNOSIS — I252 Old myocardial infarction: Secondary | ICD-10-CM | POA: Diagnosis not present

## 2019-05-08 DIAGNOSIS — M25551 Pain in right hip: Secondary | ICD-10-CM | POA: Diagnosis not present

## 2019-05-08 DIAGNOSIS — Z951 Presence of aortocoronary bypass graft: Secondary | ICD-10-CM | POA: Diagnosis not present

## 2019-05-08 DIAGNOSIS — R54 Age-related physical debility: Secondary | ICD-10-CM | POA: Diagnosis not present

## 2019-05-08 DIAGNOSIS — I255 Ischemic cardiomyopathy: Secondary | ICD-10-CM | POA: Diagnosis not present

## 2019-05-08 DIAGNOSIS — M48061 Spinal stenosis, lumbar region without neurogenic claudication: Secondary | ICD-10-CM | POA: Diagnosis not present

## 2019-05-08 DIAGNOSIS — I69328 Other speech and language deficits following cerebral infarction: Secondary | ICD-10-CM | POA: Diagnosis not present

## 2019-05-08 DIAGNOSIS — N3001 Acute cystitis with hematuria: Secondary | ICD-10-CM | POA: Diagnosis not present

## 2019-05-08 DIAGNOSIS — E1151 Type 2 diabetes mellitus with diabetic peripheral angiopathy without gangrene: Secondary | ICD-10-CM | POA: Diagnosis not present

## 2019-05-08 DIAGNOSIS — I110 Hypertensive heart disease with heart failure: Secondary | ICD-10-CM | POA: Diagnosis not present

## 2019-05-08 DIAGNOSIS — Z87891 Personal history of nicotine dependence: Secondary | ICD-10-CM | POA: Diagnosis not present

## 2019-05-08 DIAGNOSIS — E034 Atrophy of thyroid (acquired): Secondary | ICD-10-CM | POA: Diagnosis not present

## 2019-05-08 DIAGNOSIS — M4802 Spinal stenosis, cervical region: Secondary | ICD-10-CM | POA: Diagnosis not present

## 2019-05-08 DIAGNOSIS — I2510 Atherosclerotic heart disease of native coronary artery without angina pectoris: Secondary | ICD-10-CM | POA: Diagnosis not present

## 2019-05-08 DIAGNOSIS — E782 Mixed hyperlipidemia: Secondary | ICD-10-CM | POA: Diagnosis not present

## 2019-05-08 DIAGNOSIS — Z792 Long term (current) use of antibiotics: Secondary | ICD-10-CM | POA: Diagnosis not present

## 2019-05-08 DIAGNOSIS — R5383 Other fatigue: Secondary | ICD-10-CM | POA: Diagnosis not present

## 2019-05-08 DIAGNOSIS — R531 Weakness: Secondary | ICD-10-CM | POA: Diagnosis not present

## 2019-05-08 DIAGNOSIS — F5101 Primary insomnia: Secondary | ICD-10-CM | POA: Diagnosis not present

## 2019-05-08 DIAGNOSIS — I69351 Hemiplegia and hemiparesis following cerebral infarction affecting right dominant side: Secondary | ICD-10-CM | POA: Diagnosis not present

## 2019-05-08 DIAGNOSIS — Z79899 Other long term (current) drug therapy: Secondary | ICD-10-CM | POA: Diagnosis not present

## 2019-05-08 DIAGNOSIS — R001 Bradycardia, unspecified: Secondary | ICD-10-CM | POA: Diagnosis not present

## 2019-05-08 DIAGNOSIS — G5603 Carpal tunnel syndrome, bilateral upper limbs: Secondary | ICD-10-CM | POA: Diagnosis not present

## 2019-05-11 DIAGNOSIS — R54 Age-related physical debility: Secondary | ICD-10-CM | POA: Diagnosis not present

## 2019-05-11 DIAGNOSIS — E034 Atrophy of thyroid (acquired): Secondary | ICD-10-CM | POA: Diagnosis not present

## 2019-05-11 DIAGNOSIS — I110 Hypertensive heart disease with heart failure: Secondary | ICD-10-CM | POA: Diagnosis not present

## 2019-05-11 DIAGNOSIS — Z79899 Other long term (current) drug therapy: Secondary | ICD-10-CM | POA: Diagnosis not present

## 2019-05-11 DIAGNOSIS — R5383 Other fatigue: Secondary | ICD-10-CM | POA: Diagnosis not present

## 2019-05-11 DIAGNOSIS — I69328 Other speech and language deficits following cerebral infarction: Secondary | ICD-10-CM | POA: Diagnosis not present

## 2019-05-11 DIAGNOSIS — I255 Ischemic cardiomyopathy: Secondary | ICD-10-CM | POA: Diagnosis not present

## 2019-05-11 DIAGNOSIS — M25551 Pain in right hip: Secondary | ICD-10-CM | POA: Diagnosis not present

## 2019-05-11 DIAGNOSIS — I69351 Hemiplegia and hemiparesis following cerebral infarction affecting right dominant side: Secondary | ICD-10-CM | POA: Diagnosis not present

## 2019-05-11 DIAGNOSIS — M4802 Spinal stenosis, cervical region: Secondary | ICD-10-CM | POA: Diagnosis not present

## 2019-05-11 DIAGNOSIS — Z951 Presence of aortocoronary bypass graft: Secondary | ICD-10-CM | POA: Diagnosis not present

## 2019-05-11 DIAGNOSIS — F5101 Primary insomnia: Secondary | ICD-10-CM | POA: Diagnosis not present

## 2019-05-11 DIAGNOSIS — Z87891 Personal history of nicotine dependence: Secondary | ICD-10-CM | POA: Diagnosis not present

## 2019-05-11 DIAGNOSIS — E782 Mixed hyperlipidemia: Secondary | ICD-10-CM | POA: Diagnosis not present

## 2019-05-11 DIAGNOSIS — R001 Bradycardia, unspecified: Secondary | ICD-10-CM | POA: Diagnosis not present

## 2019-05-11 DIAGNOSIS — M48061 Spinal stenosis, lumbar region without neurogenic claudication: Secondary | ICD-10-CM | POA: Diagnosis not present

## 2019-05-11 DIAGNOSIS — G5603 Carpal tunnel syndrome, bilateral upper limbs: Secondary | ICD-10-CM | POA: Diagnosis not present

## 2019-05-11 DIAGNOSIS — I2510 Atherosclerotic heart disease of native coronary artery without angina pectoris: Secondary | ICD-10-CM | POA: Diagnosis not present

## 2019-05-11 DIAGNOSIS — E1151 Type 2 diabetes mellitus with diabetic peripheral angiopathy without gangrene: Secondary | ICD-10-CM | POA: Diagnosis not present

## 2019-05-11 DIAGNOSIS — N3001 Acute cystitis with hematuria: Secondary | ICD-10-CM | POA: Diagnosis not present

## 2019-05-11 DIAGNOSIS — I252 Old myocardial infarction: Secondary | ICD-10-CM | POA: Diagnosis not present

## 2019-05-11 DIAGNOSIS — M6281 Muscle weakness (generalized): Secondary | ICD-10-CM | POA: Diagnosis not present

## 2019-05-11 DIAGNOSIS — Z792 Long term (current) use of antibiotics: Secondary | ICD-10-CM | POA: Diagnosis not present

## 2019-05-11 DIAGNOSIS — R531 Weakness: Secondary | ICD-10-CM | POA: Diagnosis not present

## 2019-05-19 DIAGNOSIS — M25551 Pain in right hip: Secondary | ICD-10-CM | POA: Diagnosis not present

## 2019-05-19 DIAGNOSIS — N3001 Acute cystitis with hematuria: Secondary | ICD-10-CM | POA: Diagnosis not present

## 2019-05-19 DIAGNOSIS — R531 Weakness: Secondary | ICD-10-CM | POA: Diagnosis not present

## 2019-05-19 DIAGNOSIS — M48061 Spinal stenosis, lumbar region without neurogenic claudication: Secondary | ICD-10-CM | POA: Diagnosis not present

## 2019-05-19 DIAGNOSIS — M6281 Muscle weakness (generalized): Secondary | ICD-10-CM | POA: Diagnosis not present

## 2019-05-19 DIAGNOSIS — Z79899 Other long term (current) drug therapy: Secondary | ICD-10-CM | POA: Diagnosis not present

## 2019-05-19 DIAGNOSIS — I2510 Atherosclerotic heart disease of native coronary artery without angina pectoris: Secondary | ICD-10-CM | POA: Diagnosis not present

## 2019-05-19 DIAGNOSIS — Z792 Long term (current) use of antibiotics: Secondary | ICD-10-CM | POA: Diagnosis not present

## 2019-05-19 DIAGNOSIS — M4802 Spinal stenosis, cervical region: Secondary | ICD-10-CM | POA: Diagnosis not present

## 2019-05-19 DIAGNOSIS — I69328 Other speech and language deficits following cerebral infarction: Secondary | ICD-10-CM | POA: Diagnosis not present

## 2019-05-19 DIAGNOSIS — R5383 Other fatigue: Secondary | ICD-10-CM | POA: Diagnosis not present

## 2019-05-19 DIAGNOSIS — R001 Bradycardia, unspecified: Secondary | ICD-10-CM | POA: Diagnosis not present

## 2019-05-19 DIAGNOSIS — F5101 Primary insomnia: Secondary | ICD-10-CM | POA: Diagnosis not present

## 2019-05-19 DIAGNOSIS — G5603 Carpal tunnel syndrome, bilateral upper limbs: Secondary | ICD-10-CM | POA: Diagnosis not present

## 2019-05-19 DIAGNOSIS — I110 Hypertensive heart disease with heart failure: Secondary | ICD-10-CM | POA: Diagnosis not present

## 2019-05-19 DIAGNOSIS — I252 Old myocardial infarction: Secondary | ICD-10-CM | POA: Diagnosis not present

## 2019-05-19 DIAGNOSIS — Z951 Presence of aortocoronary bypass graft: Secondary | ICD-10-CM | POA: Diagnosis not present

## 2019-05-19 DIAGNOSIS — E034 Atrophy of thyroid (acquired): Secondary | ICD-10-CM | POA: Diagnosis not present

## 2019-05-19 DIAGNOSIS — E1151 Type 2 diabetes mellitus with diabetic peripheral angiopathy without gangrene: Secondary | ICD-10-CM | POA: Diagnosis not present

## 2019-05-19 DIAGNOSIS — Z87891 Personal history of nicotine dependence: Secondary | ICD-10-CM | POA: Diagnosis not present

## 2019-05-19 DIAGNOSIS — I69351 Hemiplegia and hemiparesis following cerebral infarction affecting right dominant side: Secondary | ICD-10-CM | POA: Diagnosis not present

## 2019-05-19 DIAGNOSIS — R54 Age-related physical debility: Secondary | ICD-10-CM | POA: Diagnosis not present

## 2019-05-19 DIAGNOSIS — I255 Ischemic cardiomyopathy: Secondary | ICD-10-CM | POA: Diagnosis not present

## 2019-05-19 DIAGNOSIS — E782 Mixed hyperlipidemia: Secondary | ICD-10-CM | POA: Diagnosis not present

## 2019-05-26 ENCOUNTER — Telehealth: Payer: Self-pay | Admitting: Cardiology

## 2019-05-26 DIAGNOSIS — E785 Hyperlipidemia, unspecified: Secondary | ICD-10-CM

## 2019-05-26 NOTE — Telephone Encounter (Signed)
Has questions about her shot

## 2019-05-26 NOTE — Telephone Encounter (Signed)
Called and spoke with patient's granddaughter per dpr. She was informing me that patient needs assistance and education on how to inject her repatha. Normally the lipid clinic goes over this. I will consult with Dr. Agustin Cree and see if this referral needs to be placed.

## 2019-05-27 NOTE — Telephone Encounter (Signed)
Please refer to lipid clinic 

## 2019-05-28 NOTE — Telephone Encounter (Signed)
Left message for patient granddaughter to return call.

## 2019-05-28 NOTE — Addendum Note (Signed)
Addended by: Ashok Norris on: 05/28/2019 08:48 AM   Modules accepted: Orders

## 2019-05-29 ENCOUNTER — Telehealth: Payer: Self-pay | Admitting: Internal Medicine

## 2019-05-29 NOTE — Telephone Encounter (Signed)
LVM for patient to schedule a new patient appt with Dr. Debara Pickett regarding hyperlipidemia.

## 2019-06-05 DIAGNOSIS — Z23 Encounter for immunization: Secondary | ICD-10-CM | POA: Diagnosis not present

## 2019-06-05 NOTE — Telephone Encounter (Signed)
Left another message for daughter to return call  

## 2019-06-05 NOTE — Telephone Encounter (Signed)
Ok, lets find out which one is that med

## 2019-06-05 NOTE — Telephone Encounter (Signed)
Called granddaughter she reports it is Zetia. Unsure of dose. Patient will call us back when she is able to inform us of dose.

## 2019-06-05 NOTE — Telephone Encounter (Signed)
Patients granddaughter called back and informed me patient will not be taking the injections she has decided not to and that her pcp has started her on a oral medication for cholesterol. She is not sure which one though.

## 2019-06-09 DIAGNOSIS — M25551 Pain in right hip: Secondary | ICD-10-CM | POA: Diagnosis not present

## 2019-06-09 DIAGNOSIS — M6281 Muscle weakness (generalized): Secondary | ICD-10-CM | POA: Diagnosis not present

## 2019-06-09 DIAGNOSIS — R0602 Shortness of breath: Secondary | ICD-10-CM | POA: Diagnosis not present

## 2019-06-09 DIAGNOSIS — R5383 Other fatigue: Secondary | ICD-10-CM | POA: Diagnosis not present

## 2019-06-10 NOTE — Telephone Encounter (Signed)
Left message for granddaughter to return call  

## 2019-06-15 NOTE — Telephone Encounter (Signed)
Granddaughter reports patient is taking Zetia 10 mg daily. Will update chart.

## 2019-06-18 ENCOUNTER — Other Ambulatory Visit: Payer: Self-pay

## 2019-06-18 ENCOUNTER — Ambulatory Visit (INDEPENDENT_AMBULATORY_CARE_PROVIDER_SITE_OTHER): Payer: Medicare Other | Admitting: Cardiology

## 2019-06-18 ENCOUNTER — Encounter: Payer: Self-pay | Admitting: Cardiology

## 2019-06-18 VITALS — BP 136/72 | HR 64 | Ht 64.5 in | Wt 150.8 lb

## 2019-06-18 DIAGNOSIS — I10 Essential (primary) hypertension: Secondary | ICD-10-CM

## 2019-06-18 DIAGNOSIS — R0602 Shortness of breath: Secondary | ICD-10-CM

## 2019-06-18 DIAGNOSIS — I739 Peripheral vascular disease, unspecified: Secondary | ICD-10-CM

## 2019-06-18 DIAGNOSIS — I25810 Atherosclerosis of coronary artery bypass graft(s) without angina pectoris: Secondary | ICD-10-CM | POA: Diagnosis not present

## 2019-06-18 DIAGNOSIS — R001 Bradycardia, unspecified: Secondary | ICD-10-CM | POA: Diagnosis not present

## 2019-06-18 NOTE — Patient Instructions (Signed)
Medication Instructions:  Your physician recommends that you continue on your current medications as directed. Please refer to the Current Medication list given to you today.  *If you need a refill on your cardiac medications before your next appointment, please call your pharmacy*  Lab Work: Your physician recommends that you return for lab work today: BMP, Pro bnp   If you have labs (blood work) drawn today and your tests are completely normal, you will receive your results only by: Marland Kitchen MyChart Message (if you have MyChart) OR . A paper copy in the mail If you have any lab test that is abnormal or we need to change your treatment, we will call you to review the results.  Testing/Procedures: Your physician has requested that you have an echocardiogram. Echocardiography is a painless test that uses sound waves to create images of your heart. It provides your doctor with information about the size and shape of your heart and how well your heart's chambers and valves are working. This procedure takes approximately one hour. There are no restrictions for this procedure.    Follow-Up: At [facility], you and your health needs are our priority.  As part of our continuing mission to provide you with exceptional heart care, we have created designated Provider Care Teams.  These Care Teams include your primary Cardiologist (physician) and Advanced Practice Providers (APPs -  Physician Assistants and Nurse Practitioners) who all work together to provide you with the care you need, when you need it.  Your next appointment:   6 week   The format for your next appointment:   In Person  Provider:   Jenne Campus, MD  Other Instructions   Echocardiogram An echocardiogram is a procedure that uses painless sound waves (ultrasound) to produce an image of the heart. Images from an echocardiogram can provide important information about:  Signs of coronary artery disease (CAD).  Aneurysm  detection. An aneurysm is a weak or damaged part of an artery wall that bulges out from the normal force of blood pumping through the body.  Heart size and shape. Changes in the size or shape of the heart can be associated with certain conditions, including heart failure, aneurysm, and CAD.  Heart muscle function.  Heart valve function.  Signs of a past heart attack.  Fluid buildup around the heart.  Thickening of the heart muscle.  A tumor or infectious growth around the heart valves. Tell a health care provider about:  Any allergies you have.  All medicines you are taking, including vitamins, herbs, eye drops, creams, and over-the-counter medicines.  Any blood disorders you have.  Any surgeries you have had.  Any medical conditions you have.  Whether you are pregnant or may be pregnant. What are the risks? Generally, this is a safe procedure. However, problems may occur, including:  Allergic reaction to dye (contrast) that may be used during the procedure. What happens before the procedure? No specific preparation is needed. You may eat and drink normally. What happens during the procedure?   An IV tube may be inserted into one of your veins.  You may receive contrast through this tube. A contrast is an injection that improves the quality of the pictures from your heart.  A gel will be applied to your chest.  A wand-like tool (transducer) will be moved over your chest. The gel will help to transmit the sound waves from the transducer.  The sound waves will harmlessly bounce off of your heart to allow  the heart images to be captured in real-time motion. The images will be recorded on a computer. The procedure may vary among health care providers and hospitals. What happens after the procedure?  You may return to your normal, everyday life, including diet, activities, and medicines, unless your health care provider tells you not to do that. Summary  An  echocardiogram is a procedure that uses painless sound waves (ultrasound) to produce an image of the heart.  Images from an echocardiogram can provide important information about the size and shape of your heart, heart muscle function, heart valve function, and fluid buildup around your heart.  You do not need to do anything to prepare before this procedure. You may eat and drink normally.  After the echocardiogram is completed, you may return to your normal, everyday life, unless your health care provider tells you not to do that. This information is not intended to replace advice given to you by your health care provider. Make sure you discuss any questions you have with your health care provider. Document Released: 08/10/2000 Document Revised: 12/04/2018 Document Reviewed: 09/15/2016 Elsevier Patient Education  2020 [employer].

## 2019-06-18 NOTE — Progress Notes (Signed)
Cardiology Office Note:    Date:  06/18/2019   ID:  THEO POLLINS, DOB 18-Dec-1926, MRN [id number]  PCP:  Rochel Brome, MD  Cardiologist:  Jenne Campus, MD    Referring MD: Rochel Brome, MD   Chief Complaint  Patient presents with  . Follow-up  I have episode shortness of breath and fatigue  History of Present Illness:    [name] is a 85 y.o. female with history of cardiomyopathy with improvement, coronary artery disease, depression, hypertension comes today to my office with her daughter.  She described situations that she became short of breath became very tired weak exhausted, she went to her primary care physician who gave her diuretics only 20 mg furosemide and she feels dramatically better.  There is no swelling of lower extremities there is no proximal nocturnal dyspnea.  There was no chest pain.  Past Medical History:  Diagnosis Date  . Anemia    hx  . Arthritis   . CAD (coronary artery disease)   . Cardiomyopathy, ischemic    EF 40% at the time cath  . Depression   . Diabetes mellitus without complication (HCC)    borderline no meds  . Dyslipidemia   . GERD (gastroesophageal reflux disease)   . HTN (hypertension)   . Myocardial infarction (HCC)    mild  . Stroke ([place])   . Urinary frequency     Past Surgical History:  Procedure Laterality Date  . APPENDECTOMY    . CESAREAN SECTION     x2  . CORONARY ARTERY BYPASS GRAFT     LIMA to the LAD, SVG to diagonal, SVG to obtuse marginal, SVG to PDA..  2008  . LUMBAR LAMINECTOMY/DECOMPRESSION MICRODISCECTOMY N/A 05/01/2013   Procedure: LUMBAR LAMINECTOMY/DECOMPRESSION MICRODISCECTOMY 2 LEVELS;  Surgeon: Winfield Cunas, MD;  Location: [place] [facility];  Service: Neurosurgery;  Laterality: N/A;  Lumbar Three-Four, Four-Five Laminectomies   . LUMBAR LAMINECTOMY/DECOMPRESSION MICRODISCECTOMY N/A 07/14/2014   Procedure: LUMBAR LAMINECTOMY/DECOMPRESSION MICRODISCECTOMY 1 LEVEL LUMBAR TWO-THREE;  Surgeon: Ashok Pall, MD;  Location: [place] [facility];  Service: Neurosurgery;  Laterality: N/A;  . SHOULDER ARTHROSCOPY W/ ROTATOR CUFF REPAIR Right    04  . TONSILLECTOMY      Current Medications: Current Meds  Medication Sig  . amLODipine (NORVASC) 5 MG tablet Take 5 mg by mouth daily.  Marland Kitchen aspirin EC 81 MG tablet Take 81 mg by mouth daily.  . Cholecalciferol (VITAMIN D3) 25 MCG (1000 UT) CAPS Take 1 capsule by mouth daily.  Marland Kitchen ezetimibe (ZETIA) 10 MG tablet Take 10 mg by mouth daily.  . fish oil-omega-3 fatty acids 1000 MG capsule Take 1 g by mouth daily.   . furosemide (LASIX) 20 MG tablet Take 20 mg by mouth daily.   Marland Kitchen levothyroxine (SYNTHROID, LEVOTHROID) 50 MCG tablet Take 1 tablet by mouth daily.  Marland Kitchen lisinopril (PRINIVIL,ZESTRIL) 40 MG tablet Take 40 mg by mouth daily.   Marland Kitchen LORazepam (ATIVAN) 0.5 MG tablet Take 0.5 mg by mouth at bedtime.  . sertraline (ZOLOFT) 100 MG tablet Take 150 mg by mouth at bedtime.      Allergies:   Atorvastatin, Statins, Amlodipine, Crestor [rosuvastatin calcium], Pravastatin, Simvastatin, and Penicillins   Social History   Socioeconomic History  . Marital status: Married    Spouse name: Not on file  . Number of children: Not on file  . Years of education: Not on file  . Highest education level: Not on file  Occupational History  . Not  on file  Social Needs  . Financial resource strain: Not on file  . Food insecurity    Worry: Not on file    Inability: Not on file  . Transportation needs    Medical: Not on file    Non-medical: Not on file  Tobacco Use  . Smoking status: Never Smoker  . Smokeless tobacco: Never Used  Substance and Sexual Activity  . Alcohol use: No  . Drug use: No  . Sexual activity: Not on file  Lifestyle  . Physical activity    Days per week: Not on file    Minutes per session: Not on file  . Stress: Not on file  Relationships  . Social Herbalist on phone: Not on file    Gets together: Not on file    Attends religious  service: Not on file    Active member of club or organization: Not on file    Attends meetings of clubs or organizations: Not on file    Relationship status: Not on file  Other Topics Concern  . Not on file  Social History Narrative  . Not on file     Family History: The patient's family history includes Heart attack in her father; Stroke in her mother. ROS:   Please see the history of present illness.    All 14 point review of systems negative except as described per history of present illness  EKGs/Labs/Other Studies Reviewed:      Recent Labs: No results found for requested labs within last 8760 hours.  Recent Lipid Panel    Component Value Date/Time   CHOL 327 (H) 12/10/2015 0648   TRIG 151 (H) 12/10/2015 0648   HDL 60 12/10/2015 0648   CHOLHDL 5.5 12/10/2015 0648   VLDL 30 12/10/2015 0648   LDLCALC 237 (H) 12/10/2015 0648    Physical Exam:    VS:  BP 136/72   Pulse 64   Ht 5' 4.5" (1.638 m)   Wt 150 lb 12.8 oz (68.4 kg)   SpO2 96%   BMI 25.49 kg/m     Wt Readings from Last 3 Encounters:  06/18/19 150 lb 12.8 oz (68.4 kg)  02/16/19 150 lb (68 kg)  11/13/18 154 lb (69.9 kg)     GEN:  Well nourished, well developed in no acute distress HEENT: Normal NECK: No JVD; No carotid bruits LYMPHATICS: No lymphadenopathy CARDIAC: RRR, no murmurs, no rubs, no gallops RESPIRATORY:  Clear to auscultation without rales, wheezing or rhonchi  ABDOMEN: Soft, non-tender, non-distended MUSCULOSKELETAL:  No edema; No deformity  SKIN: Warm and dry LOWER EXTREMITIES: no swelling NEUROLOGIC:  Alert and oriented x 3 PSYCHIATRIC:  Normal affect   ASSESSMENT:    1. Essential hypertension   2. DYSPNEA   3. Atherosclerosis of coronary artery bypass graft of native heart without angina pectoris   4. Bradycardia   5. Peripheral vascular disease ([place])    PLAN:    In order of problems listed above:  1. Benign essential hypertension blood pressure controlled continue  present management 2. Dyspnea improved significantly after diuretic.  I will ask her to have Chem-7 as well as proBNP done today to make sure her treatment is sufficient.  I will schedule her to have an echocardiogram to assess left ventricular ejection fraction. 3. Bradycardia denies having any dizziness or passing out 4. Peripheral vascular disease stable   Medication Adjustments/Labs and Tests Ordered: Current medicines are reviewed at length with the patient today.  Concerns regarding medicines are outlined above.  Orders Placed This Encounter  Procedures  . Basic metabolic panel  . Pro b natriuretic peptide (BNP)  . ECHOCARDIOGRAM COMPLETE   Medication changes: No orders of the defined types were placed in this encounter.   Signed, Park Liter, MD, [facility] 06/18/2019 3:23 PM    [place]

## 2019-06-19 LAB — BASIC METABOLIC PANEL
BUN/Creatinine Ratio: 27 (ref 12–28)
BUN: 27 mg/dL (ref 8–27)
CO2: 22 mmol/L (ref 20–29)
Calcium: 9.8 mg/dL (ref 8.7–10.3)
Chloride: 96 mmol/L (ref 96–106)
Creatinine, Ser: 0.99 mg/dL (ref 0.57–1.00)
GFR calc Af Amer: 58 mL/min/{1.73_m2} — ABNORMAL LOW (ref >59)
GFR calc non Af Amer: 51 mL/min/{1.73_m2} — ABNORMAL LOW (ref >59)
Glucose: 102 mg/dL — ABNORMAL HIGH (ref 65–99)
Potassium: 4.5 mmol/L (ref 3.5–5.2)
Sodium: 134 mmol/L (ref 134–144)

## 2019-06-19 LAB — PRO B NATRIURETIC PEPTIDE: NT-Pro BNP: 667 pg/mL (ref 0–738)

## 2019-06-26 DIAGNOSIS — M25551 Pain in right hip: Secondary | ICD-10-CM | POA: Diagnosis not present

## 2019-07-10 DIAGNOSIS — I110 Hypertensive heart disease with heart failure: Secondary | ICD-10-CM | POA: Diagnosis not present

## 2019-07-27 ENCOUNTER — Other Ambulatory Visit: Payer: Medicare Other

## 2019-08-14 ENCOUNTER — Ambulatory Visit (INDEPENDENT_AMBULATORY_CARE_PROVIDER_SITE_OTHER): Payer: Medicare Other | Admitting: Cardiology

## 2019-08-14 ENCOUNTER — Encounter: Payer: Self-pay | Admitting: Cardiology

## 2019-08-14 ENCOUNTER — Other Ambulatory Visit: Payer: Self-pay

## 2019-08-14 VITALS — BP 110/60 | HR 77 | Ht 64.5 in | Wt 148.0 lb

## 2019-08-14 DIAGNOSIS — I25709 Atherosclerosis of coronary artery bypass graft(s), unspecified, with unspecified angina pectoris: Secondary | ICD-10-CM | POA: Diagnosis not present

## 2019-08-14 DIAGNOSIS — M1611 Unilateral primary osteoarthritis, right hip: Secondary | ICD-10-CM | POA: Diagnosis not present

## 2019-08-14 DIAGNOSIS — I10 Essential (primary) hypertension: Secondary | ICD-10-CM | POA: Diagnosis not present

## 2019-08-14 DIAGNOSIS — R001 Bradycardia, unspecified: Secondary | ICD-10-CM

## 2019-08-14 DIAGNOSIS — M25551 Pain in right hip: Secondary | ICD-10-CM | POA: Diagnosis not present

## 2019-08-14 NOTE — Addendum Note (Signed)
Addended by: Ashok Norris on: 08/14/2019 03:02 PM   Modules accepted: Orders

## 2019-08-14 NOTE — Progress Notes (Signed)
Cardiology Office Note:    Date:  08/14/2019   ID:  [name], DOB [birth year], MRN [id number]  PCP:  Rochel Brome, MD  Cardiologist:  Jenne Campus, MD    Referring MD: Rochel Brome, MD   Chief Complaint  Patient presents with  . 6 week follow up  Doing well  History of Present Illness:    [name] is a 85 y.o. female with past medical history significant for coronary artery disease, also cardiomyopathy which is ischemic in origin, dyslipidemia, borderline diabetes with no meds, bradycardia.  She comes today to my office for follow-up last time I seen her she did have some shortness of breath she was given some diuretic with good improvement after that proBNP was checked was normal.  She was scheduled to have echocardiogram however did not do it because the day she is supposed to have echocardiogram was very bad weather.  Overall doing well saying that she is tired and exhausted but she just turned 89 just last week.  Past Medical History:  Diagnosis Date  . Anemia    hx  . Arthritis   . CAD (coronary artery disease)   . Cardiomyopathy, ischemic    EF 40% at the time cath  . Depression   . Diabetes mellitus without complication (HCC)    borderline no meds  . Dyslipidemia   . GERD (gastroesophageal reflux disease)   . HTN (hypertension)   . Myocardial infarction (HCC)    mild  . Stroke ([place])   . Urinary frequency     Past Surgical History:  Procedure Laterality Date  . APPENDECTOMY    . CESAREAN SECTION     x2  . CORONARY ARTERY BYPASS GRAFT     LIMA to the LAD, SVG to diagonal, SVG to obtuse marginal, SVG to PDA..  2008  . LUMBAR LAMINECTOMY/DECOMPRESSION MICRODISCECTOMY N/A 05/01/2013   Procedure: LUMBAR LAMINECTOMY/DECOMPRESSION MICRODISCECTOMY 2 LEVELS;  Surgeon: Winfield Cunas, MD;  Location: [place] [facility];  Service: Neurosurgery;  Laterality: N/A;  Lumbar Three-Four, Four-Five Laminectomies   . LUMBAR LAMINECTOMY/DECOMPRESSION MICRODISCECTOMY N/A  07/14/2014   Procedure: LUMBAR LAMINECTOMY/DECOMPRESSION MICRODISCECTOMY 1 LEVEL LUMBAR TWO-THREE;  Surgeon: Ashok Pall, MD;  Location: [place] [facility];  Service: Neurosurgery;  Laterality: N/A;  . SHOULDER ARTHROSCOPY W/ ROTATOR CUFF REPAIR Right    04  . TONSILLECTOMY      Current Medications: Current Meds  Medication Sig  . amLODipine (NORVASC) 5 MG tablet Take 5 mg by mouth daily.  Marland Kitchen aspirin EC 81 MG tablet Take 81 mg by mouth daily.  . Cholecalciferol (VITAMIN D3) 25 MCG (1000 UT) CAPS Take 1 capsule by mouth daily.  Marland Kitchen ezetimibe (ZETIA) 10 MG tablet Take 10 mg by mouth daily.  . fish oil-omega-3 fatty acids 1000 MG capsule Take 1 g by mouth daily.   . furosemide (LASIX) 20 MG tablet Take 20 mg by mouth daily.   Marland Kitchen levothyroxine (SYNTHROID, LEVOTHROID) 50 MCG tablet Take 1 tablet by mouth daily.  Marland Kitchen lisinopril (PRINIVIL,ZESTRIL) 40 MG tablet Take 40 mg by mouth daily.   Marland Kitchen LORazepam (ATIVAN) 0.5 MG tablet Take 0.5 mg by mouth at bedtime.  . sertraline (ZOLOFT) 100 MG tablet Take 150 mg by mouth at bedtime.      Allergies:   Atorvastatin, Statins, Amlodipine, Crestor [rosuvastatin calcium], Pravastatin, Simvastatin, and Penicillins   Social History   Socioeconomic History  . Marital status: Married    Spouse name: Not on file  . Number of  children: Not on file  . Years of education: Not on file  . Highest education level: Not on file  Occupational History  . Not on file  Tobacco Use  . Smoking status: Never Smoker  . Smokeless tobacco: Never Used  Substance and Sexual Activity  . Alcohol use: No  . Drug use: No  . Sexual activity: Not on file  Other Topics Concern  . Not on file  Social History Narrative  . Not on file   Social Determinants of Health   Financial Resource Strain:   . Difficulty of Paying Living Expenses: Not on file  Food Insecurity:   . Worried About Charity fundraiser in the Last Year: Not on file  . Ran Out of Food in the Last Year: Not on file   Transportation Needs:   . Lack of Transportation (Medical): Not on file  . Lack of Transportation (Non-Medical): Not on file  Physical Activity:   . Days of Exercise per Week: Not on file  . Minutes of Exercise per Session: Not on file  Stress:   . Feeling of Stress : Not on file  Social Connections:   . Frequency of Communication with Friends and Family: Not on file  . Frequency of Social Gatherings with Friends and Family: Not on file  . Attends Religious Services: Not on file  . Active Member of Clubs or Organizations: Not on file  . Attends Archivist Meetings: Not on file  . Marital Status: Not on file     Family History: The patient's family history includes Heart attack in her father; Stroke in her mother. ROS:   Please see the history of present illness.    All 14 point review of systems negative except as described per history of present illness  EKGs/Labs/Other Studies Reviewed:      Recent Labs: 06/18/2019: BUN 27; Creatinine, Ser 0.99; NT-Pro BNP 667; Potassium 4.5; Sodium 134  Recent Lipid Panel    Component Value Date/Time   CHOL 327 (H) 12/10/2015 0648   TRIG 151 (H) 12/10/2015 0648   HDL 60 12/10/2015 0648   CHOLHDL 5.5 12/10/2015 0648   VLDL 30 12/10/2015 0648   LDLCALC 237 (H) 12/10/2015 0648    Physical Exam:    VS:  BP 110/60   Pulse 77   Ht 5' 4.5" (1.638 m)   Wt 148 lb (67.1 kg)   SpO2 94%   BMI 25.01 kg/m     Wt Readings from Last 3 Encounters:  08/14/19 148 lb (67.1 kg)  06/18/19 150 lb 12.8 oz (68.4 kg)  02/16/19 150 lb (68 kg)     GEN:  Well nourished, well developed in no acute distress HEENT: Normal NECK: No JVD; No carotid bruits LYMPHATICS: No lymphadenopathy CARDIAC: RRR, no murmurs, no rubs, no gallops RESPIRATORY:  Clear to auscultation without rales, wheezing or rhonchi  ABDOMEN: Soft, non-tender, non-distended MUSCULOSKELETAL:  No edema; No deformity  SKIN: Warm and dry LOWER EXTREMITIES: no swelling  NEUROLOGIC:  Alert and oriented x 3 PSYCHIATRIC:  Normal affect   ASSESSMENT:    1. Coronary artery disease involving coronary bypass graft of native heart with angina pectoris ([place])   2. Essential hypertension   3. Bradycardia    PLAN:    In order of problems listed above:  1. Coronary artery disease stable from that point review we will continue present management. 2. Essential hypertension blood pressure appears to be well controlled continue present management. 3. Bradycardia denies  having any dizziness or passing out. 4. History of congestive heart failure.  Now hemodynamically stable I will schedule her to have echocardiogram.  I see her back in my office in about 4 months   Medication Adjustments/Labs and Tests Ordered: Current medicines are reviewed at length with the patient today.  Concerns regarding medicines are outlined above.  No orders of the defined types were placed in this encounter.  Medication changes: No orders of the defined types were placed in this encounter.   Signed, Park Liter, MD, [facility] 08/14/2019 2:51 PM    [place]

## 2019-08-14 NOTE — Patient Instructions (Signed)
Medication Instructions:  Your physician recommends that you continue on your current medications as directed. Please refer to the Current Medication list given to you today.  *If you need a refill on your cardiac medications before your next appointment, please call your pharmacy*  Lab Work: None.  If you have labs (blood work) drawn today and your tests are completely normal, you will receive your results only by: . MyChart Message (if you have MyChart) OR . A paper copy in the mail If you have any lab test that is abnormal or we need to change your treatment, we will call you to review the results.  Testing/Procedures: Your physician has requested that you have an echocardiogram. Echocardiography is a painless test that uses sound waves to create images of your heart. It provides your doctor with information about the size and shape of your heart and how well your heart's chambers and valves are working. This procedure takes approximately one hour. There are no restrictions for this procedure.    Follow-Up: At [facility], you and your health needs are our priority.  As part of our continuing mission to provide you with exceptional heart care, we have created designated Provider Care Teams.  These Care Teams include your primary Cardiologist (physician) and Advanced Practice Providers (APPs -  Physician Assistants and Nurse Practitioners) who all work together to provide you with the care you need, when you need it.  Your next appointment:   4 month(s)  The format for your next appointment:   In Person  Provider:   Robert Krasowski, MD  Other Instructions   Echocardiogram An echocardiogram is a procedure that uses painless sound waves (ultrasound) to produce an image of the heart. Images from an echocardiogram can provide important information about:  Signs of coronary artery disease (CAD).  Aneurysm detection. An aneurysm is a weak or damaged part of an artery wall that  bulges out from the normal force of blood pumping through the body.  Heart size and shape. Changes in the size or shape of the heart can be associated with certain conditions, including heart failure, aneurysm, and CAD.  Heart muscle function.  Heart valve function.  Signs of a past heart attack.  Fluid buildup around the heart.  Thickening of the heart muscle.  A tumor or infectious growth around the heart valves. Tell a health care provider about:  Any allergies you have.  All medicines you are taking, including vitamins, herbs, eye drops, creams, and over-the-counter medicines.  Any blood disorders you have.  Any surgeries you have had.  Any medical conditions you have.  Whether you are pregnant or may be pregnant. What are the risks? Generally, this is a safe procedure. However, problems may occur, including:  Allergic reaction to dye (contrast) that may be used during the procedure. What happens before the procedure? No specific preparation is needed. You may eat and drink normally. What happens during the procedure?   An IV tube may be inserted into one of your veins.  You may receive contrast through this tube. A contrast is an injection that improves the quality of the pictures from your heart.  A gel will be applied to your chest.  A wand-like tool (transducer) will be moved over your chest. The gel will help to transmit the sound waves from the transducer.  The sound waves will harmlessly bounce off of your heart to allow the heart images to be captured in real-time motion. The images will be recorded   on a computer. The procedure may vary among health care providers and hospitals. What happens after the procedure?  You may return to your normal, everyday life, including diet, activities, and medicines, unless your health care provider tells you not to do that. Summary  An echocardiogram is a procedure that uses painless sound waves (ultrasound) to produce  an image of the heart.  Images from an echocardiogram can provide important information about the size and shape of your heart, heart muscle function, heart valve function, and fluid buildup around your heart.  You do not need to do anything to prepare before this procedure. You may eat and drink normally.  After the echocardiogram is completed, you may return to your normal, everyday life, unless your health care provider tells you not to do that. This information is not intended to replace advice given to you by your health care provider. Make sure you discuss any questions you have with your health care provider. Document Released: 08/10/2000 Document Revised: 12/04/2018 Document Reviewed: 09/15/2016 Elsevier Patient Education  2020 [employer]   

## 2019-08-26 DIAGNOSIS — H5203 Hypermetropia, bilateral: Secondary | ICD-10-CM | POA: Diagnosis not present

## 2019-08-26 DIAGNOSIS — E119 Type 2 diabetes mellitus without complications: Secondary | ICD-10-CM | POA: Diagnosis not present

## 2019-08-31 DIAGNOSIS — N3000 Acute cystitis without hematuria: Secondary | ICD-10-CM | POA: Diagnosis not present

## 2019-10-01 DIAGNOSIS — N390 Urinary tract infection, site not specified: Secondary | ICD-10-CM | POA: Diagnosis not present

## 2019-10-01 DIAGNOSIS — N952 Postmenopausal atrophic vaginitis: Secondary | ICD-10-CM | POA: Diagnosis not present

## 2019-10-01 DIAGNOSIS — Z79899 Other long term (current) drug therapy: Secondary | ICD-10-CM | POA: Diagnosis not present

## 2019-10-02 ENCOUNTER — Other Ambulatory Visit: Payer: Self-pay | Admitting: Family Medicine

## 2019-10-02 DIAGNOSIS — M1611 Unilateral primary osteoarthritis, right hip: Secondary | ICD-10-CM | POA: Diagnosis not present

## 2019-10-13 ENCOUNTER — Ambulatory Visit (INDEPENDENT_AMBULATORY_CARE_PROVIDER_SITE_OTHER): Payer: Medicare Other

## 2019-10-13 ENCOUNTER — Other Ambulatory Visit: Payer: Self-pay

## 2019-10-13 ENCOUNTER — Ambulatory Visit: Payer: Medicare Other | Admitting: Family Medicine

## 2019-10-13 DIAGNOSIS — I25709 Atherosclerosis of coronary artery bypass graft(s), unspecified, with unspecified angina pectoris: Secondary | ICD-10-CM | POA: Diagnosis not present

## 2019-10-13 NOTE — Progress Notes (Signed)
Complete echocardiogram has been performed.  Jimmy Kalianne Fetting RDCS, RVT 

## 2019-10-13 NOTE — Progress Notes (Deleted)
Complete echocardiogram has been performed.  Jimmy Margherita Collyer RDCS, RVT 

## 2019-10-14 ENCOUNTER — Telehealth: Payer: Medicare Other | Admitting: Family Medicine

## 2019-10-15 ENCOUNTER — Telehealth: Payer: Self-pay | Admitting: *Deleted

## 2019-10-15 NOTE — Telephone Encounter (Signed)
Patient informed. Copy sent to PCP °

## 2019-10-15 NOTE — Telephone Encounter (Signed)
-----   Message from Park Liter, MD sent at 10/14/2019 12:54 PM EST ----- Echocardiogram showed normal left ventricular ejection fraction, trace MR,

## 2019-10-21 ENCOUNTER — Telehealth: Payer: Self-pay | Admitting: Cardiology

## 2019-10-21 NOTE — Telephone Encounter (Signed)
Patient's daughter Manuela Schwartz calling with the patient, stating the patient was confused about the echo results. They would like a call back with clarification.

## 2019-10-21 NOTE — Telephone Encounter (Signed)
The patient has been notified of the result and verbalized understanding.  All questions (if any) were answered. Wilma Flavin, RN 10/21/2019 10:11 AM

## 2019-10-26 ENCOUNTER — Ambulatory Visit: Payer: Medicare Other | Admitting: Family Medicine

## 2019-10-29 DIAGNOSIS — N390 Urinary tract infection, site not specified: Secondary | ICD-10-CM | POA: Diagnosis not present

## 2019-10-29 DIAGNOSIS — L089 Local infection of the skin and subcutaneous tissue, unspecified: Secondary | ICD-10-CM | POA: Diagnosis not present

## 2019-10-29 DIAGNOSIS — L720 Epidermal cyst: Secondary | ICD-10-CM | POA: Diagnosis not present

## 2019-11-02 ENCOUNTER — Other Ambulatory Visit: Payer: Medicare Other

## 2019-11-02 ENCOUNTER — Other Ambulatory Visit: Payer: Self-pay

## 2019-11-02 DIAGNOSIS — E785 Hyperlipidemia, unspecified: Secondary | ICD-10-CM | POA: Diagnosis not present

## 2019-11-02 DIAGNOSIS — E1169 Type 2 diabetes mellitus with other specified complication: Secondary | ICD-10-CM

## 2019-11-02 DIAGNOSIS — R531 Weakness: Secondary | ICD-10-CM | POA: Insufficient documentation

## 2019-11-02 DIAGNOSIS — I10 Essential (primary) hypertension: Secondary | ICD-10-CM | POA: Diagnosis not present

## 2019-11-02 DIAGNOSIS — E782 Mixed hyperlipidemia: Secondary | ICD-10-CM

## 2019-11-03 ENCOUNTER — Telehealth: Payer: Self-pay | Admitting: Emergency Medicine

## 2019-11-03 ENCOUNTER — Ambulatory Visit (INDEPENDENT_AMBULATORY_CARE_PROVIDER_SITE_OTHER): Payer: Medicare Other | Admitting: Family Medicine

## 2019-11-03 ENCOUNTER — Encounter: Payer: Self-pay | Admitting: Family Medicine

## 2019-11-03 VITALS — BP 110/64 | HR 60 | Temp 97.5°F | Ht 64.0 in

## 2019-11-03 DIAGNOSIS — E1169 Type 2 diabetes mellitus with other specified complication: Secondary | ICD-10-CM | POA: Diagnosis not present

## 2019-11-03 DIAGNOSIS — N1831 Chronic kidney disease, stage 3a: Secondary | ICD-10-CM | POA: Insufficient documentation

## 2019-11-03 DIAGNOSIS — R269 Unspecified abnormalities of gait and mobility: Secondary | ICD-10-CM | POA: Insufficient documentation

## 2019-11-03 DIAGNOSIS — E782 Mixed hyperlipidemia: Secondary | ICD-10-CM

## 2019-11-03 DIAGNOSIS — R001 Bradycardia, unspecified: Secondary | ICD-10-CM

## 2019-11-03 DIAGNOSIS — N3000 Acute cystitis without hematuria: Secondary | ICD-10-CM

## 2019-11-03 DIAGNOSIS — M545 Low back pain, unspecified: Secondary | ICD-10-CM | POA: Insufficient documentation

## 2019-11-03 DIAGNOSIS — I1310 Hypertensive heart and chronic kidney disease without heart failure, with stage 1 through stage 4 chronic kidney disease, or unspecified chronic kidney disease: Secondary | ICD-10-CM

## 2019-11-03 DIAGNOSIS — G8929 Other chronic pain: Secondary | ICD-10-CM

## 2019-11-03 DIAGNOSIS — E440 Moderate protein-calorie malnutrition: Secondary | ICD-10-CM | POA: Insufficient documentation

## 2019-11-03 DIAGNOSIS — E785 Hyperlipidemia, unspecified: Secondary | ICD-10-CM

## 2019-11-03 DIAGNOSIS — I951 Orthostatic hypotension: Secondary | ICD-10-CM

## 2019-11-03 LAB — POCT URINALYSIS DIPSTICK
Bilirubin, UA: NEGATIVE
Blood, UA: NEGATIVE
Glucose, UA: NEGATIVE
Ketones, UA: NEGATIVE
Nitrite, UA: NEGATIVE
Protein, UA: POSITIVE — AB
Spec Grav, UA: 1.02 (ref 1.010–1.025)
Urobilinogen, UA: 0.2 E.U./dL
pH, UA: 6 (ref 5.0–8.0)

## 2019-11-03 NOTE — Telephone Encounter (Signed)
Called patient to schedule appointment per Dr. Agustin Cree. She is unable to make appointment until she talks to her granddaughter who would be driving her she states she will call back tomorrow and schedule.

## 2019-11-03 NOTE — Progress Notes (Signed)
Subjective:  Patient ID: [name], female    DOB: 1926-11-26  Age: 86 y.o. MRN: [id number]  Chief Complaint  Patient presents with  . Fatigue  . Diabetes  . Hyperlipidemia    HPI Patient is a 86 year old white female with history of coronary artery disease, stroke, hyperlipidemia, chronic back pain, recurrent UTIs, and diabetes who presents today for her chronic follow-up however was noted yesterday when she came for lab work to be very tremulous and off balance.  This has been going on for approximately 1 week.  She did see Dr. Nila Nephew for recurrent UTIs and was started on Bactrim DS.  She was unable to give [place] a urinalysis yesterday but did today.  It did look abnormal however she had recently started the Bactrim so I went ahead and sent this for culture.  The patient does report a fall over the weekend in which she hit a chair on the right side of her abdomen.  She was sore but no significant pain.  No loss of consciousness.  No syncopal episode.  For the patient's diabetes she checks her sugars once a day and they run 90s to 100s.  She denies hypoglycemia.  She does eat fairly healthy.  Her exercise is limited by her balance.  She had not had any falls for approximately 1 year until this past weekend.  Her A1c today is 5.8 and she is currently not on any diabetes medicines.  Hyperlipidemia is greatly improved.  She is taking Zetia and fish oil.  Her LDL improved from 218 to 155.  Her triglycerides came down from 314 to 97.  In terms of her hypertensive coronary artery disease her blood pressure has been well controlled.  She denies chest pain.  Denies palpitations.  She is currently on amlodipine 5 mg once daily, lisinopril 40 mg once daily, and Lasix 20 mg once daily.   Social History   Socioeconomic History  . Marital status: Married    Spouse name: Not on file  . Number of children: Not on file  . Years of education: Not on file  . Highest education level: Not on file    Occupational History  . Not on file  Tobacco Use  . Smoking status: Former Research scientist (life sciences)  . Smokeless tobacco: Never Used  Substance and Sexual Activity  . Alcohol use: No  . Drug use: No  . Sexual activity: Not on file  Other Topics Concern  . Not on file  Social History Narrative  . Not on file   Social Determinants of Health   Financial Resource Strain:   . Difficulty of Paying Living Expenses: Not on file  Food Insecurity:   . Worried About Charity fundraiser in the Last Year: Not on file  . Ran Out of Food in the Last Year: Not on file  Transportation Needs:   . Lack of Transportation (Medical): Not on file  . Lack of Transportation (Non-Medical): Not on file  Physical Activity:   . Days of Exercise per Week: Not on file  . Minutes of Exercise per Session: Not on file  Stress:   . Feeling of Stress : Not on file  Social Connections:   . Frequency of Communication with Friends and Family: Not on file  . Frequency of Social Gatherings with Friends and Family: Not on file  . Attends Religious Services: Not on file  . Active Member of Clubs or Organizations: Not on file  . Attends Club or  Organization Meetings: Not on file  . Marital Status: Not on file   Past Medical History:  Diagnosis Date  . Anemia    hx  . Arthritis   . CAD (coronary artery disease)   . Cardiomyopathy, ischemic    EF 40% at the time cath  . Depression   . Diabetes mellitus without complication (HCC)    borderline no meds  . Dyslipidemia   . GERD (gastroesophageal reflux disease)   . HTN (hypertension)   . Myocardial infarction (HCC)    mild  . Stroke ([place])   . Urinary frequency    Family History  Problem Relation Age of Onset  . Stroke Mother   . Heart attack Father     Review of Systems  Constitutional: Positive for fatigue. Negative for chills and fever.       Generalized weakness.  HENT: Negative for congestion, ear pain and sore throat.   Respiratory: Positive for shortness of  breath. Negative for cough.        With ambulation  Cardiovascular: Negative for chest pain.  Gastrointestinal: Negative for abdominal pain, constipation, diarrhea, nausea and vomiting.  Endocrine: Negative for polydipsia, polyphagia and polyuria.  Genitourinary: Negative for dysuria and urgency.  Musculoskeletal: Positive for back pain.       Significant.  With any ambulation she hurts moderate to severely.  Neurological: Positive for dizziness, tremors and weakness. Negative for headaches.  Psychiatric/Behavioral: Negative for dysphoric mood. The patient is not nervous/anxious.      Objective:  BP 110/64   Pulse 60   Temp (!) 97.5 F (36.4 C)   Ht 5\' 4"  (1.626 m)   BMI 25.40 kg/m   BP/Weight 11/03/2019 08/14/2019 [id number]  Systolic BP [id number]  Diastolic BP 64 60 72  Wt. (Lbs) - 148 150.8  BMI 25.4 25.01 25.49    Physical Exam Vitals (Tired appearing.) reviewed.  Constitutional:      Appearance: She is normal weight.  Neck:     Vascular: No carotid bruit.  Cardiovascular:     Rate and Rhythm: Regular rhythm. Bradycardia present.     Comments: Distant heart sounds.  Difficult to hear. Pulmonary:     Effort: Pulmonary effort is normal. No respiratory distress.     Breath sounds: Normal breath sounds.  Abdominal:     General: Bowel sounds are normal.     Palpations: Abdomen is soft.     Tenderness: There is no abdominal tenderness.  Neurological:     Mental Status: She is alert and oriented to person, place, and time.     Motor: Weakness, tremor and atrophy present.     Gait: Gait abnormal.     Comments: Patient was able to get up from her wheelchair after rocking forward and backward about 3 times. Took significant effort and then seemed exhausted. She walked to exam table  (4 feet) holding my hands. Very unstable.   Psychiatric:        Mood and Affect: Mood normal.        Behavior: Behavior normal.     Lab Results  Component Value Date   WBC 10.4  02/07/2016   HGB 11.6 (L) 02/07/2016   HCT 34.0 (L) 02/07/2016   PLT 384 02/07/2016   GLUCOSE 102 (H) 06/18/2019   CHOL 327 (H) 12/10/2015   TRIG 151 (H) 12/10/2015   HDL 60 12/10/2015   LDLCALC 237 (H) 12/10/2015   ALT 21 02/07/2016   AST 32 02/07/2016  NA 134 06/18/2019   K 4.5 06/18/2019   CL 96 06/18/2019   CREATININE 0.99 06/18/2019   BUN 27 06/18/2019   CO2 22 06/18/2019   TSH 4.045 05/27/2007   INR 1.08 02/07/2016   HGBA1C 6.2 (H) 12/10/2015      Assessment & Plan:  1. Acute cystitis without hematuria - management per Dr. Nila Nephew. Complete Bactrim DS. - POCT urinalysis dipstick  2. Mixed hyperlipidemia: Greatly improved although not at goal. Would be great candidate for repatha.  3. Hypertensive cardiovascular-renal disease, stage 1-4 or unspecified chronic kidney disease, without heart failure: Management per Dr. Kirkland Hun.   4. Dyslipidemia associated with type 2 diabetes mellitus ([place]): Well-controlled with diet.  5. Bradycardia: Discussed with Dr. Agustin Cree.  Patient to be seen within the next few days. - EKG 12-Lead - c/w sinus bradycardia.  6. Orthostatic hypotension - hold amlodipine, Lasix, and lisinopril until seen by Dr. Agustin Cree this week.  This is her likely cause of dizziness.  Although she has multifactorial reasons for this.  Patient is to check blood pressure and heart rate daily.  7. Abnormality of gait and mobility -discussed importance of continued use of walker.  Family members checking in on her twice daily.  Patient prefers not to have physical therapy currently.  She has both a wheelchair and a walker at her disposal.  8. Malnutrition of moderate degree ([place]) despite her BMI of 25 she clearly has evidence of malnourishment.  Wasting of muscles.  9. Chronic midline low back pain without sciatica This is a chronic issue and I am reluctant to prescribe narcotics or muscle relaxants due to already increased risk of falls.  Patient may use  Tylenol.  10. Chronic kidney disease, stage 3a - recommended hydration.  Creatinine is normally less than 100.  Follow-up: Return in about 4 weeks (around 12/01/2019). If for any reason the patient is unable to see Dr. Agustin Cree within the next 1 to 2 weeks for any reason, she is to follow-up with [place].  AVS was given to patient prior to departure.  Rochel Brome [facility] [phone]

## 2019-11-03 NOTE — Patient Instructions (Addendum)
HOLD AMLODIPINE. HOLD LASIX.  HOLD LISINOPRIL DR. KRAZOWSKI'S NURSE TO CALL YOU TOMORROW.  PLEASE CHECK BP AND PULSE DAILY.

## 2019-11-04 DIAGNOSIS — L089 Local infection of the skin and subcutaneous tissue, unspecified: Secondary | ICD-10-CM | POA: Diagnosis not present

## 2019-11-04 DIAGNOSIS — L720 Epidermal cyst: Secondary | ICD-10-CM | POA: Diagnosis not present

## 2019-11-04 LAB — COMP. METABOLIC PANEL (12)
AST: 34 IU/L (ref 0–40)
Albumin/Globulin Ratio: 1.8 (ref 1.2–2.2)
Albumin: 4.3 g/dL (ref 3.5–4.6)
Alkaline Phosphatase: 80 IU/L (ref 39–117)
BUN/Creatinine Ratio: 19 (ref 12–28)
BUN: 24 mg/dL (ref 10–36)
Bilirubin Total: 0.2 mg/dL (ref 0.0–1.2)
Calcium: 9.6 mg/dL (ref 8.7–10.3)
Chloride: 90 mmol/L — ABNORMAL LOW (ref 96–106)
Creatinine, Ser: 1.26 mg/dL — ABNORMAL HIGH (ref 0.57–1.00)
GFR calc Af Amer: 43 mL/min/{1.73_m2} — ABNORMAL LOW (ref >59)
GFR calc non Af Amer: 38 mL/min/{1.73_m2} — ABNORMAL LOW (ref >59)
Globulin, Total: 2.4 g/dL (ref 1.5–4.5)
Glucose: 98 mg/dL (ref 65–99)
Potassium: 5.2 mmol/L (ref 3.5–5.2)
Sodium: 128 mmol/L — ABNORMAL LOW (ref 134–144)
Total Protein: 6.7 g/dL (ref 6.0–8.5)

## 2019-11-04 LAB — HEMOGLOBIN A1C
Est. average glucose Bld gHb Est-mCnc: 120 mg/dL
Hgb A1c MFr Bld: 5.8 % — ABNORMAL HIGH (ref 4.8–5.6)

## 2019-11-04 LAB — LIPID PANEL
Chol/HDL Ratio: 2.8 ratio (ref 0.0–4.4)
Cholesterol, Total: 267 mg/dL — ABNORMAL HIGH (ref 100–199)
HDL: 96 mg/dL (ref >39)
LDL Chol Calc (NIH): 155 mg/dL — ABNORMAL HIGH (ref 0–99)
Triglycerides: 97 mg/dL (ref 0–149)
VLDL Cholesterol Cal: 16 mg/dL (ref 5–40)

## 2019-11-04 LAB — CARDIOVASCULAR RISK ASSESSMENT

## 2019-11-04 LAB — CBC WITH DIFFERENTIAL/PLATELET
Basophils Absolute: 0 10*3/uL (ref 0.0–0.2)
Basos: 0 %
EOS (ABSOLUTE): 0.1 10*3/uL (ref 0.0–0.4)
Eos: 1 %
Hematocrit: 41.1 % (ref 34.0–46.6)
Hemoglobin: 13.8 g/dL (ref 11.1–15.9)
Immature Grans (Abs): 0.1 10*3/uL (ref 0.0–0.1)
Immature Granulocytes: 1 %
Lymphocytes Absolute: 1.6 10*3/uL (ref 0.7–3.1)
Lymphs: 18 %
MCH: 28.7 pg (ref 26.6–33.0)
MCHC: 33.6 g/dL (ref 31.5–35.7)
MCV: 85 fL (ref 79–97)
Monocytes Absolute: 0.8 10*3/uL (ref 0.1–0.9)
Monocytes: 9 %
Neutrophils Absolute: 6 10*3/uL (ref 1.4–7.0)
Neutrophils: 71 %
Platelets: 337 10*3/uL (ref 150–450)
RBC: 4.81 x10E6/uL (ref 3.77–5.28)
RDW: 13.7 % (ref 11.7–15.4)
WBC: 8.6 10*3/uL (ref 3.4–10.8)

## 2019-11-04 LAB — TSH: TSH: 2.94 u[IU]/mL (ref 0.450–4.500)

## 2019-11-06 ENCOUNTER — Encounter: Payer: Self-pay | Admitting: Cardiology

## 2019-11-06 ENCOUNTER — Ambulatory Visit: Payer: Medicare Other | Admitting: Cardiology

## 2019-11-06 ENCOUNTER — Other Ambulatory Visit: Payer: Self-pay

## 2019-11-06 VITALS — BP 138/70 | HR 62 | Ht 64.0 in | Wt 145.0 lb

## 2019-11-06 DIAGNOSIS — E1169 Type 2 diabetes mellitus with other specified complication: Secondary | ICD-10-CM | POA: Diagnosis not present

## 2019-11-06 DIAGNOSIS — I951 Orthostatic hypotension: Secondary | ICD-10-CM

## 2019-11-06 DIAGNOSIS — I10 Essential (primary) hypertension: Secondary | ICD-10-CM | POA: Diagnosis not present

## 2019-11-06 DIAGNOSIS — I25709 Atherosclerosis of coronary artery bypass graft(s), unspecified, with unspecified angina pectoris: Secondary | ICD-10-CM

## 2019-11-06 DIAGNOSIS — R001 Bradycardia, unspecified: Secondary | ICD-10-CM

## 2019-11-06 DIAGNOSIS — E785 Hyperlipidemia, unspecified: Secondary | ICD-10-CM

## 2019-11-06 NOTE — Patient Instructions (Addendum)
Medication Instructions:  Your physician recommends that you continue on your current medications as directed. Please refer to the Current Medication list given to you today.  *If you need a refill on your cardiac medications before your next appointment, please call your pharmacy*   Lab Work: None. If you have labs (blood work) drawn today and your tests are completely normal, you will receive your results only by: . MyChart Message (if you have MyChart) OR . A paper copy in the mail If you have any lab test that is abnormal or we need to change your treatment, we will call you to review the results.   Testing/Procedures: A zio monitor was ordered today. It will remain on for 7 days. You will then return monitor and event diary in provided box. It takes 1-2 weeks for report to be downloaded and returned to us. We will call you with the results. If monitor falls off or has orange flashing light, please call Zio for further instructions.      Follow-Up: At [facility], you and your health needs are our priority.  As part of our continuing mission to provide you with exceptional heart care, we have created designated Provider Care Teams.  These Care Teams include your primary Cardiologist (physician) and Advanced Practice Providers (APPs -  Physician Assistants and Nurse Practitioners) who all work together to provide you with the care you need, when you need it.  We recommend signing up for the patient portal called "MyChart".  Sign up information is provided on this After Visit Summary.  MyChart is used to connect with patients for Virtual Visits (Telemedicine).  Patients are able to view lab/test results, encounter notes, upcoming appointments, etc.  Non-urgent messages can be sent to your provider as well.   To learn more about what you can do with MyChart, go to [email or web address].    Your next appointment:   2 month(s)  The format for your next appointment:   In  Person  Provider:   Robert Krasowski, MD   Other Instructions   

## 2019-11-06 NOTE — Progress Notes (Signed)
Cardiology Office Note:    Date:  11/06/2019   ID:  [name], DOB 1927/07/08, MRN [id number]  PCP:  Angela Brome, MD  Cardiologist:  Jenne Campus, MD    Referring MD: Angela Brome, MD   No chief complaint on file. , Weak and tired  History of Present Illness:    [name] is a 86 y.o. female with past medical history significant for coronary artery disease, status post coronary artery bypass graft, cardiomyopathy, however latest echocardiogram showed normal left ventricle ejection fraction, bradycardia, borderline diabetes.  She comes today to my office for follow-up overall she is feeling poor she is weak tired exhausted.  Recently she seen her primary care physician who noted her blood pressure being low and multiple medication has been withdrawn that include amlodipine, Lasix as well as Zestril.  Still in spite of that she is tired and exhausted she said anytime she tried to walk and do something she would get shaky.  Past Medical History:  Diagnosis Date  . Anemia    hx  . Arthritis   . CAD (coronary artery disease)   . Cardiomyopathy, ischemic    EF 40% at the time cath  . Depression   . Diabetes mellitus without complication (HCC)    borderline no meds  . Dyslipidemia   . GERD (gastroesophageal reflux disease)   . HTN (hypertension)   . Myocardial infarction (HCC)    mild  . Stroke ([place])   . Urinary frequency     Past Surgical History:  Procedure Laterality Date  . APPENDECTOMY    . CESAREAN SECTION     x2  . CORONARY ARTERY BYPASS GRAFT     LIMA to the LAD, SVG to diagonal, SVG to obtuse marginal, SVG to PDA..  2008  . LUMBAR LAMINECTOMY/DECOMPRESSION MICRODISCECTOMY N/A 05/01/2013   Procedure: LUMBAR LAMINECTOMY/DECOMPRESSION MICRODISCECTOMY 2 LEVELS;  Surgeon: Winfield Cunas, MD;  Location: [place] [facility];  Service: Neurosurgery;  Laterality: N/A;  Lumbar Three-Four, Four-Five Laminectomies   . LUMBAR LAMINECTOMY/DECOMPRESSION MICRODISCECTOMY N/A  07/14/2014   Procedure: LUMBAR LAMINECTOMY/DECOMPRESSION MICRODISCECTOMY 1 LEVEL LUMBAR TWO-THREE;  Surgeon: Ashok Pall, MD;  Location: [place] NEUR[facility] ORS;  Service: Neurosurgery;  Laterality: N/A;  . SHOULDER ARTHROSCOPY W/ ROTATOR CUFF REPAIR Right    04  . TONSILLECTOMY      Current Medications: Current Meds  Medication Sig  . aspirin EC 81 MG tablet Take 81 mg by mouth daily.  . Cholecalciferol (VITAMIN D3) 25 MCG (1000 UT) CAPS Take 1 capsule by mouth daily.  . D-Mannose POWD Take 1 Scoop by mouth daily. 1 TEASPOON DAILY IN 8 OZ. OF JUICE  . ezetimibe (ZETIA) 10 MG tablet Take 10 mg by mouth daily.  . fish oil-omega-3 fatty acids 1000 MG capsule Take 1 g by mouth daily.   . Lancets (ONETOUCH ULTRASOFT) lancets   . levothyroxine (SYNTHROID) 50 MCG tablet TAKE 1 TABLET BY MOUTH  DAILY  . LORazepam (ATIVAN) 0.5 MG tablet Take 0.5 mg by mouth at bedtime.  Glory Rosebush ULTRA test strip 1 each daily.  Marland Kitchen oxybutynin (DITROPAN-XL) 5 MG 24 hr tablet Take 5 mg by mouth daily.  . pantoprazole (PROTONIX) 40 MG tablet   . sertraline (ZOLOFT) 100 MG tablet Take 150 mg by mouth at bedtime.   . sulfamethoxazole-trimethoprim (BACTRIM DS) 800-160 MG tablet Take 1 tablet by mouth 2 (two) times daily.     Allergies:   Atorvastatin, Statins, Amlodipine, Crestor [rosuvastatin calcium], Pravastatin, Simvastatin, and Penicillins  Social History   Socioeconomic History  . Marital status: Married    Spouse name: Not on file  . Number of children: Not on file  . Years of education: Not on file  . Highest education level: Not on file  Occupational History  . Not on file  Tobacco Use  . Smoking status: Former Research scientist (life sciences)  . Smokeless tobacco: Never Used  Substance and Sexual Activity  . Alcohol use: No  . Drug use: No  . Sexual activity: Not on file  Other Topics Concern  . Not on file  Social History Narrative  . Not on file   Social Determinants of Health   Financial Resource Strain:   .  Difficulty of Paying Living Expenses:   Food Insecurity:   . Worried About Charity fundraiser in the Last Year:   . Arboriculturist in the Last Year:   Transportation Needs:   . Film/video editor (Medical):   Marland Kitchen Lack of Transportation (Non-Medical):   Physical Activity:   . Days of Exercise per Week:   . Minutes of Exercise per Session:   Stress:   . Feeling of Stress :   Social Connections:   . Frequency of Communication with Friends and Family:   . Frequency of Social Gatherings with Friends and Family:   . Attends Religious Services:   . Active Member of Clubs or Organizations:   . Attends Archivist Meetings:   Marland Kitchen Marital Status:      Family History: The patient's family history includes Heart attack in her father; Stroke in her mother. ROS:   Please see the history of present illness.    All 14 point review of systems negative except as described per history of present illness  EKGs/Labs/Other Studies Reviewed:      Recent Labs: 06/18/2019: NT-Pro BNP 667 11/02/2019: BUN 24; Creatinine, Ser 1.26; Hemoglobin 13.8; Platelets 337; Potassium 5.2; Sodium 128; TSH 2.940  Recent Lipid Panel    Component Value Date/Time   CHOL 267 (H) 11/02/2019 1016   TRIG 97 11/02/2019 1016   HDL 96 11/02/2019 1016   CHOLHDL 2.8 11/02/2019 1016   CHOLHDL 5.5 12/10/2015 0648   VLDL 30 12/10/2015 0648   LDLCALC 155 (H) 11/02/2019 1016    Physical Exam:    VS:  BP 138/70   Pulse 62   Ht 5\' 4"  (1.626 m)   Wt 145 lb (65.8 kg)   SpO2 96%   BMI 24.89 kg/m     Wt Readings from Last 3 Encounters:  11/06/19 145 lb (65.8 kg)  08/14/19 148 lb (67.1 kg)  06/18/19 150 lb 12.8 oz (68.4 kg)     GEN:  Well nourished, well developed in no acute distress HEENT: Normal NECK: No JVD; No carotid bruits LYMPHATICS: No lymphadenopathy CARDIAC: RRR, no murmurs, no rubs, no gallops RESPIRATORY:  Clear to auscultation without rales, wheezing or rhonchi  ABDOMEN: Soft, non-tender,  non-distended MUSCULOSKELETAL:  No edema; No deformity  SKIN: Warm and dry LOWER EXTREMITIES: no swelling NEUROLOGIC:  Alert and oriented x 3 PSYCHIATRIC:  Normal affect   ASSESSMENT:    1. Coronary artery disease involving coronary bypass graft of native heart with angina pectoris ([place])   2. Essential hypertension, benign   3. Dyslipidemia associated with type 2 diabetes mellitus ([place])   4. Orthostatic hypotension    PLAN:    In order of problems listed above:  1. Coronary artery disease: Stable from that point review continue  present management. 2. Essential hypertension blood pressure well controlled right now without medication which I will continue. 3. Dyslipidemia she is on fish oil only.  Will call primary care physician to get her fasting lipid profile.  She is also taking Zetia. 4. Profound fatigue and tiredness EKG today showed sinus rhythm a, PVCs, right bundle branch block. 5. I will ask you to wear a Zio patch to see if she got any significant arrhythmia and significant bradycardia that could be responsible for his symptomatology.  I cannot pinpoint exactly what is the reason for her not feeling well.  I suspect bradycardia may play some role..   Medication Adjustments/Labs and Tests Ordered: Current medicines are reviewed at length with the patient today.  Concerns regarding medicines are outlined above.  No orders of the defined types were placed in this encounter.  Medication changes: No orders of the defined types were placed in this encounter.   Signed, Park Liter, MD, [facility] 11/06/2019 4:01 PM    [place]

## 2019-11-06 NOTE — Addendum Note (Signed)
Addended by: Ashok Norris on: 11/06/2019 04:46 PM   Modules accepted: Orders

## 2019-11-06 NOTE — Addendum Note (Signed)
Addended by: Ashok Norris on: 11/06/2019 04:12 PM   Modules accepted: Orders

## 2019-11-10 ENCOUNTER — Telehealth: Payer: Self-pay

## 2019-11-10 NOTE — Telephone Encounter (Signed)
[name] called to report that her [name] has been complaining of pain in her chest when swallowing over the last 24 hours.  She denies fever, chill, or sorethroat.  She has not had any choking problems that [name] is aware.  She will call for an appointment if symptoms continue today.

## 2019-11-12 ENCOUNTER — Ambulatory Visit (INDEPENDENT_AMBULATORY_CARE_PROVIDER_SITE_OTHER): Payer: Medicare Other

## 2019-11-12 DIAGNOSIS — R001 Bradycardia, unspecified: Secondary | ICD-10-CM | POA: Diagnosis not present

## 2019-11-26 ENCOUNTER — Other Ambulatory Visit: Payer: Self-pay | Admitting: Family Medicine

## 2019-11-30 DIAGNOSIS — R001 Bradycardia, unspecified: Secondary | ICD-10-CM | POA: Diagnosis not present

## 2019-12-03 ENCOUNTER — Telehealth: Payer: Self-pay | Admitting: Family Medicine

## 2019-12-03 NOTE — Progress Notes (Signed)
  Chronic Care Management   Note  12/03/2019 Name: [name] MRN: [id number] DOB: 1926/12/25  [name] is a 86 y.o. year old female who is a primary care patient of Cox, Kirsten, MD. I reached out to [name] by phone today in response to a referral sent by Ms. Nelia Shi Wootan's PCP, Cox, Kirsten, MD.   Ms. Pegan was given information about Chronic Care Management services today including:  1. CCM service includes personalized support from designated clinical staff supervised by her physician, including individualized plan of care and coordination with other care providers 2. 24/7 contact phone numbers for assistance for urgent and routine care needs. 3. Service will only be billed when office clinical staff spend 20 minutes or more in a month to coordinate care. 4. Only one practitioner may furnish and bill the service in a calendar month. 5. The patient may stop CCM services at any time (effective at the end of the month) by phone call to the office staff.   Patient agreed to services and verbal consent obtained.   Follow up plan:   Earney Hamburg Upstream Scheduler

## 2019-12-03 NOTE — Progress Notes (Signed)
  Chronic Care Management   Outreach Note  12/03/2019 Name: [name] MRN: [id number] DOB: [birth year]  Referred by: Rochel Brome, MD Reason for referral : No chief complaint on file.   An unsuccessful telephone outreach was attempted today. The patient was referred to the pharmacist for assistance with care management and care coordination.   Follow Up Plan:   Earney Hamburg Upstream Scheduler

## 2019-12-09 ENCOUNTER — Ambulatory Visit: Payer: Medicare Other | Admitting: Family Medicine

## 2019-12-13 DIAGNOSIS — M545 Low back pain: Secondary | ICD-10-CM | POA: Diagnosis not present

## 2019-12-16 NOTE — Progress Notes (Signed)
Established Patient Office Visit  Subjective:  Patient ID: [name], female    DOB: April 06, 1927  Age: 86 y.o. MRN: [id number]  CC:  Chief Complaint  Patient presents with  . Hypotension    Was advised to hold Amlodipine, Lasix and Lisinopril. Patient states she had to restart all 3 meds due to her b/p being elevated    HPI [name] presents for follow-up of hypertension.  At her visit 1 month ago I had held her amlodipine, Lasix and lisinopril.  Patient reports that her blood pressure went back up to [id number] systolic and she restarted all of these medicines on her own.  Her blood pressure today is very well controlled.  The patient reports that on this past Sunday she went to the urgent care with complaints of bilateral hip pain and left lower quadrant and right lower quadrant abdominal pain.  I did a urinalysis which was normal the x-ray of her lumbar spine which showed degenerative disc disease which is known.  Patient does not have a history of diverticulitis.  They did not do any blood work per report from patient and granddaughter.  They gave her meloxicam 7.5 mg once daily.  Patient has chronic kidney disease and she is not to take NSAIDs I will discontinue this.  It did not help anyways. She sees Carolyne Littles [facility], who gave her a steroid injection for trochanteric bursitis in January 2020. It did help her hip pain at that time.  Past Medical History:  Diagnosis Date  . Anemia    hx  . Arthritis   . CAD (coronary artery disease)   . Cardiomyopathy, ischemic    EF 40% at the time cath  . Depression   . Diabetes mellitus without complication (HCC)    borderline no meds  . Dyslipidemia   . GERD (gastroesophageal reflux disease)   . HTN (hypertension)   . Myocardial infarction (HCC)    mild  . Stroke ([place])   . Urinary frequency     Past Surgical History:  Procedure Laterality Date  . APPENDECTOMY    . CESAREAN SECTION     x2  . CORONARY  ARTERY BYPASS GRAFT     LIMA to the LAD, SVG to diagonal, SVG to obtuse marginal, SVG to PDA..  2008  . LUMBAR LAMINECTOMY/DECOMPRESSION MICRODISCECTOMY N/A 05/01/2013   Procedure: LUMBAR LAMINECTOMY/DECOMPRESSION MICRODISCECTOMY 2 LEVELS;  Surgeon: Winfield Cunas, MD;  Location: [place] [facility];  Service: Neurosurgery;  Laterality: N/A;  Lumbar Three-Four, Four-Five Laminectomies   . LUMBAR LAMINECTOMY/DECOMPRESSION MICRODISCECTOMY N/A 07/14/2014   Procedure: LUMBAR LAMINECTOMY/DECOMPRESSION MICRODISCECTOMY 1 LEVEL LUMBAR TWO-THREE;  Surgeon: Ashok Pall, MD;  Location: [place] [facility];  Service: Neurosurgery;  Laterality: N/A;  . SHOULDER ARTHROSCOPY W/ ROTATOR CUFF REPAIR Right    04  . TONSILLECTOMY      Family History  Problem Relation Age of Onset  . Stroke Mother   . Heart attack Father     Social History   Socioeconomic History  . Marital status: Married    Spouse name: Not on file  . Number of children: Not on file  . Years of education: Not on file  . Highest education level: Not on file  Occupational History  . Not on file  Tobacco Use  . Smoking status: Former Research scientist (life sciences)  . Smokeless tobacco: Never Used  Substance and Sexual Activity  . Alcohol use: No  . Drug use: No  . Sexual activity: Not  on file  Other Topics Concern  . Not on file  Social History Narrative  . Not on file   Social Determinants of Health   Financial Resource Strain:   . Difficulty of Paying Living Expenses:   Food Insecurity:   . Worried About Charity fundraiser in the Last Year:   . Arboriculturist in the Last Year:   Transportation Needs:   . Film/video editor (Medical):   Marland Kitchen Lack of Transportation (Non-Medical):   Physical Activity:   . Days of Exercise per Week:   . Minutes of Exercise per Session:   Stress:   . Feeling of Stress :   Social Connections:   . Frequency of Communication with Friends and Family:   . Frequency of Social Gatherings with Friends and Family:   . Attends  Religious Services:   . Active Member of Clubs or Organizations:   . Attends Archivist Meetings:   Marland Kitchen Marital Status:   Intimate Partner Violence:   . Fear of Current or Ex-Partner:   . Emotionally Abused:   Marland Kitchen Physically Abused:   . Sexually Abused:     Outpatient Medications Prior to Visit  Medication Sig Dispense Refill  . amLODipine (NORVASC) 5 MG tablet Take 5 mg by mouth daily.    Marland Kitchen aspirin EC 81 MG tablet Take 81 mg by mouth daily.    . Cholecalciferol (VITAMIN D3) 25 MCG (1000 UT) CAPS Take 1 capsule by mouth daily.    . D-Mannose POWD Take 1 Scoop by mouth daily. 1 TEASPOON DAILY IN 8 OZ. OF JUICE    . ezetimibe (ZETIA) 10 MG tablet Take 10 mg by mouth daily.    . fish oil-omega-3 fatty acids 1000 MG capsule Take 1 g by mouth daily.     . furosemide (LASIX) 20 MG tablet Take 20 mg by mouth daily.     . Lancets (ONETOUCH ULTRASOFT) lancets     . levothyroxine (SYNTHROID) 50 MCG tablet TAKE 1 TABLET BY MOUTH  DAILY 90 tablet 3  . lisinopril (PRINIVIL,ZESTRIL) 40 MG tablet Take 40 mg by mouth daily.     Marland Kitchen LORazepam (ATIVAN) 0.5 MG tablet TAKE 1 TABLET DAILY AT BEDTIME 30 tablet 2  . ONETOUCH ULTRA test strip 1 each daily.    Marland Kitchen oxybutynin (DITROPAN-XL) 5 MG 24 hr tablet Take 5 mg by mouth daily.    . pantoprazole (PROTONIX) 40 MG tablet     . sertraline (ZOLOFT) 100 MG tablet Take 150 mg by mouth at bedtime.     . sulfamethoxazole-trimethoprim (BACTRIM DS) 800-160 MG tablet Take 1 tablet by mouth 2 (two) times daily.     No facility-administered medications prior to visit.    Allergies  Allergen Reactions  . Atorvastatin Other (See Comments)    Muscle pain  . Statins Other (See Comments)    MUSCLE PAIN  . Amlodipine Swelling  . Crestor [Rosuvastatin Calcium]   . Pravastatin   . Simvastatin     Myaligas  . Penicillins Hives    CHILDHOOD ALLERGY     ROS Review of Systems  Constitutional: Negative for chills, fatigue and fever.  HENT: Negative for  congestion, ear pain, rhinorrhea and sore throat.   Respiratory: Positive for cough and shortness of breath.        Baseline  Cardiovascular: Negative for chest pain.  Gastrointestinal: Positive for abdominal pain (lower BL.). Negative for constipation, diarrhea, nausea and vomiting.  Genitourinary: Positive for frequency.  Negative for dysuria and urgency.  Musculoskeletal: Positive for back pain. Negative for myalgias.       Low. Worse than normal. BL Hips hurting.  Neurological: Negative for dizziness, weakness, light-headedness and headaches.      Objective:    Physical Exam  Constitutional: She is oriented to person, place, and time. She appears well-developed and well-nourished.  HENT:  Right Ear: External ear normal.  Left Ear: External ear normal.  Nose: Nose normal.  Cardiovascular: Normal rate, regular rhythm and normal heart sounds.  Pulmonary/Chest: Effort normal and breath sounds normal.  Abdominal: Soft. Bowel sounds are normal. There is abdominal tenderness.  Left lower and right lower abdomen. Mild to moderate. No rebound.  Musculoskeletal:        General: Tenderness present.     Cervical back: Normal range of motion.     Comments: BL lumbar paraspinal muscles.  Discomfort with hip flexion bilaterally. Tender over right trochanteric bursa and left. (right more than left)  Neurological: She is alert and oriented to person, place, and time.  Skin: Skin is warm.  Psychiatric: She has a normal mood and affect. Her behavior is normal.    BP 136/74 (BP Location: Right Arm, Patient Position: Sitting)   Pulse 64   Temp (!) 97.3 F (36.3 C) (Temporal)   Ht 5' 4.5" (1.638 m)   Wt 149 lb 9.6 oz (67.9 kg)   SpO2 95%   BMI 25.28 kg/m  Wt Readings from Last 3 Encounters:  12/17/19 149 lb 9.6 oz (67.9 kg)  11/06/19 145 lb (65.8 kg)  08/14/19 148 lb (67.1 kg)     Health Maintenance Due  Topic Date Due  . FOOT EXAM  Never done  . OPHTHALMOLOGY EXAM  Never done  .  TETANUS/TDAP  Never done  . DEXA SCAN  Never done  . PNA vac Low Risk Adult (1 of 2 - PCV13) Never done  . COVID-19 Vaccine (2 - Moderna 2-dose series) 11/06/2019    There are no preventive care reminders to display for this patient. Urine dipstick shows nothing.    Lab Results  Component Value Date   TSH 2.940 11/02/2019   Lab Results  Component Value Date   WBC 11.3 (H) 12/17/2019   HGB 14.1 12/17/2019   HCT 41.4 12/17/2019   MCV 84 12/17/2019   PLT 365 12/17/2019   Lab Results  Component Value Date   NA 133 (L) 12/17/2019   K 5.0 12/17/2019   CO2 26 12/17/2019   GLUCOSE 92 12/17/2019   BUN 29 12/17/2019   CREATININE 1.14 (H) 12/17/2019   BILITOT <0.2 12/17/2019   ALKPHOS 83 12/17/2019   AST 29 12/17/2019   ALT 22 12/17/2019   PROT 7.0 12/17/2019   ALBUMIN 4.2 12/17/2019   CALCIUM 9.6 12/17/2019   ANIONGAP 10 02/07/2016   Lab Results  Component Value Date   CHOL 267 (H) 11/02/2019   Lab Results  Component Value Date   HDL 96 11/02/2019   Lab Results  Component Value Date   LDLCALC 155 (H) 11/02/2019   Lab Results  Component Value Date   TRIG 97 11/02/2019   Lab Results  Component Value Date   CHOLHDL 2.8 11/02/2019   Lab Results  Component Value Date   HGBA1C 5.8 (H) 11/02/2019      Assessment & Plan:  1. Hypertensive cardiovascular-renal disease, stage 1-4 or unspecified chronic kidney disease, without heart failure The current medical regimen is effective;  continue present plan and  medications. Check labs: - CBC with Differential/Platelet - Comprehensive metabolic panel  2. Greater trochanteric bursitis of right hip Risks were discussed including bleeding, infection, increase in sugars if diabetic, atrophy at site of injection, and increased pain.  After consent was obtained, using sterile technique the right trochanteric bursa was prepped with betadine and alcohol.  The bursa was injected kenalog 80 mg and 5 ml plain Lidocaine was then  injected and the needle withdrawn.  The procedure was well tolerated.  Call or return to clinic prn if any problems - triamcinolone acetonide (KENALOG-40) injection 80 mg  3. Left lower quadrant abdominal pain Check cbc, cmp. - POCT URINALYSIS DIP (CLINITEK) was normal.  4. Right lower quadrant abdominal pain Check cbc, cmp - POCT URINALYSIS DIP (CLINITEK) was normal  5. Acute bilateral low back pain without sciatica  Likely due to osteoarthritis.  Meds ordered this encounter  Medications  . triamcinolone acetonide (KENALOG-40) injection 80 mg  . DISCONTD: ciprofloxacin (CIPRO) 500 MG tablet    Sig: Take 1 tablet (500 mg total) by mouth 2 (two) times daily.    Dispense:  14 tablet    Refill:  0  . DISCONTD: metroNIDAZOLE (FLAGYL) 500 MG tablet    Sig: Take 1 tablet (500 mg total) by mouth 3 (three) times daily for 7 days.    Dispense:  21 tablet    Refill:  0  . ciprofloxacin (CIPRO) 500 MG tablet    Sig: Take 1 tablet (500 mg total) by mouth 2 (two) times daily.    Dispense:  14 tablet    Refill:  0  . metroNIDAZOLE (FLAGYL) 500 MG tablet    Sig: Take 1 tablet (500 mg total) by mouth 3 (three) times daily for 7 days.    Dispense:  21 tablet    Refill:  0     Follow-up: Return in about 3 months (around 03/17/2020).    Rochel Brome, MD   December 20, 2019 Addendum: Patient had leukocytosis. I spoke with patient and her granddaughter. Her abdominal pain has improved, however, I sent cipro and metronidazole to treat possible diverticulitis. I have recommended if her pain has not improved in 2-3 days, please call the office and certainly if it is worsening, call immediately. Rochel Brome, MD

## 2019-12-17 ENCOUNTER — Encounter: Payer: Self-pay | Admitting: Family Medicine

## 2019-12-17 ENCOUNTER — Other Ambulatory Visit: Payer: Self-pay

## 2019-12-17 ENCOUNTER — Ambulatory Visit (INDEPENDENT_AMBULATORY_CARE_PROVIDER_SITE_OTHER): Payer: Medicare Other | Admitting: Family Medicine

## 2019-12-17 VITALS — BP 136/74 | HR 64 | Temp 97.3°F | Ht 64.5 in | Wt 149.6 lb

## 2019-12-17 DIAGNOSIS — R1031 Right lower quadrant pain: Secondary | ICD-10-CM | POA: Diagnosis not present

## 2019-12-17 DIAGNOSIS — R1032 Left lower quadrant pain: Secondary | ICD-10-CM | POA: Diagnosis not present

## 2019-12-17 DIAGNOSIS — M7061 Trochanteric bursitis, right hip: Secondary | ICD-10-CM | POA: Insufficient documentation

## 2019-12-17 DIAGNOSIS — I1310 Hypertensive heart and chronic kidney disease without heart failure, with stage 1 through stage 4 chronic kidney disease, or unspecified chronic kidney disease: Secondary | ICD-10-CM

## 2019-12-17 DIAGNOSIS — M545 Low back pain, unspecified: Secondary | ICD-10-CM

## 2019-12-17 MED ORDER — TRIAMCINOLONE ACETONIDE 40 MG/ML IJ SUSP: 80.0000 mg | Freq: Once | INTRAMUSCULAR | Status: AC

## 2019-12-18 ENCOUNTER — Telehealth: Payer: Medicare Other

## 2019-12-18 LAB — CBC WITH DIFFERENTIAL/PLATELET
Basophils Absolute: 0.1 10*3/uL (ref 0.0–0.2)
Basos: 1 %
EOS (ABSOLUTE): 0.2 10*3/uL (ref 0.0–0.4)
Eos: 2 %
Hematocrit: 41.4 % (ref 34.0–46.6)
Hemoglobin: 14.1 g/dL (ref 11.1–15.9)
Immature Grans (Abs): 0.1 10*3/uL (ref 0.0–0.1)
Immature Granulocytes: 1 %
Lymphocytes Absolute: 2.5 10*3/uL (ref 0.7–3.1)
Lymphs: 22 %
MCH: 28.6 pg (ref 26.6–33.0)
MCHC: 34.1 g/dL (ref 31.5–35.7)
MCV: 84 fL (ref 79–97)
Monocytes Absolute: 1.1 10*3/uL — ABNORMAL HIGH (ref 0.1–0.9)
Monocytes: 10 %
Neutrophils Absolute: 7.3 10*3/uL — ABNORMAL HIGH (ref 1.4–7.0)
Neutrophils: 64 %
Platelets: 365 10*3/uL (ref 150–450)
RBC: 4.93 x10E6/uL (ref 3.77–5.28)
RDW: 14.4 % (ref 11.7–15.4)
WBC: 11.3 10*3/uL — ABNORMAL HIGH (ref 3.4–10.8)

## 2019-12-18 LAB — COMPREHENSIVE METABOLIC PANEL
ALT: 22 IU/L (ref 0–32)
AST: 29 IU/L (ref 0–40)
Albumin/Globulin Ratio: 1.5 (ref 1.2–2.2)
Albumin: 4.2 g/dL (ref 3.5–4.6)
Alkaline Phosphatase: 83 IU/L (ref 39–117)
BUN/Creatinine Ratio: 25 (ref 12–28)
BUN: 29 mg/dL (ref 10–36)
Bilirubin Total: <0.2 mg/dL (ref 0.0–1.2)
CO2: 26 mmol/L (ref 20–29)
Calcium: 9.6 mg/dL (ref 8.7–10.3)
Chloride: 92 mmol/L — ABNORMAL LOW (ref 96–106)
Creatinine, Ser: 1.14 mg/dL — ABNORMAL HIGH (ref 0.57–1.00)
GFR calc Af Amer: 49 mL/min/{1.73_m2} — ABNORMAL LOW (ref >59)
GFR calc non Af Amer: 42 mL/min/{1.73_m2} — ABNORMAL LOW (ref >59)
Globulin, Total: 2.8 g/dL (ref 1.5–4.5)
Glucose: 92 mg/dL (ref 65–99)
Potassium: 5 mmol/L (ref 3.5–5.2)
Sodium: 133 mmol/L — ABNORMAL LOW (ref 134–144)
Total Protein: 7 g/dL (ref 6.0–8.5)

## 2019-12-19 ENCOUNTER — Other Ambulatory Visit: Payer: Self-pay | Admitting: Family Medicine

## 2019-12-20 ENCOUNTER — Telehealth: Payer: Self-pay | Admitting: Family Medicine

## 2019-12-20 ENCOUNTER — Encounter: Payer: Self-pay | Admitting: Family Medicine

## 2019-12-20 DIAGNOSIS — R1031 Right lower quadrant pain: Secondary | ICD-10-CM | POA: Insufficient documentation

## 2019-12-20 DIAGNOSIS — R1032 Left lower quadrant pain: Secondary | ICD-10-CM | POA: Insufficient documentation

## 2019-12-20 LAB — POCT URINALYSIS DIP (CLINITEK)
Bilirubin, UA: NEGATIVE
Blood, UA: NEGATIVE
Glucose, UA: NEGATIVE mg/dL
Leukocytes, UA: NEGATIVE
Nitrite, UA: NEGATIVE
POC PROTEIN,UA: NEGATIVE
Urobilinogen, UA: NEGATIVE E.U./dL — AB

## 2019-12-20 MED ORDER — CIPROFLOXACIN HCL 500 MG PO TABS: 500.0000 mg | ORAL_TABLET | Freq: Two times a day (BID) | ORAL | 0 refills | Status: DC

## 2019-12-20 MED ORDER — CIPROFLOXACIN HCL 500 MG PO TABS: 500.0000 mg | ORAL_TABLET | Freq: Two times a day (BID) | ORAL | 0 refills | Status: AC

## 2019-12-20 MED ORDER — METRONIDAZOLE 500 MG PO TABS
500.0000 mg | ORAL_TABLET | Freq: Three times a day (TID) | ORAL | 0 refills | Status: DC
Start: 2019-12-20 — End: 2019-12-20

## 2019-12-20 MED ORDER — METRONIDAZOLE 500 MG PO TABS: 500.0000 mg | ORAL_TABLET | Freq: Three times a day (TID) | ORAL | 0 refills | Status: AC

## 2019-12-20 NOTE — Telephone Encounter (Signed)
Patient had leukocytosis on labs. I spoke with patient and her granddaughter. Her abdominal pain has improved, however, I sent cipro and metronidazole to treat possible diverticulitis. I have recommended if her pain has not improved in 2-3 days, please call the office and certainly if it is worsening, call immediately. Rochel Brome, MD

## 2019-12-23 ENCOUNTER — Telehealth: Payer: Self-pay

## 2019-12-23 ENCOUNTER — Other Ambulatory Visit: Payer: Self-pay

## 2019-12-23 DIAGNOSIS — R1032 Left lower quadrant pain: Secondary | ICD-10-CM

## 2019-12-23 NOTE — Telephone Encounter (Signed)
Patients Grand-daughter-Jennifer called stating that patient still does not feel good, feeling nausea, still having abdominal pain (left side hurts more)

## 2019-12-24 DIAGNOSIS — K5730 Diverticulosis of large intestine without perforation or abscess without bleeding: Secondary | ICD-10-CM | POA: Diagnosis not present

## 2019-12-24 NOTE — Telephone Encounter (Signed)
Ordered ct of abd/pelvis stat. kc

## 2019-12-26 DIAGNOSIS — Z66 Do not resuscitate: Secondary | ICD-10-CM | POA: Diagnosis not present

## 2019-12-26 DIAGNOSIS — R1909 Other intra-abdominal and pelvic swelling, mass and lump: Secondary | ICD-10-CM | POA: Diagnosis not present

## 2019-12-26 DIAGNOSIS — Z8711 Personal history of peptic ulcer disease: Secondary | ICD-10-CM | POA: Diagnosis not present

## 2019-12-26 DIAGNOSIS — R0602 Shortness of breath: Secondary | ICD-10-CM | POA: Diagnosis not present

## 2019-12-26 DIAGNOSIS — Z7982 Long term (current) use of aspirin: Secondary | ICD-10-CM | POA: Diagnosis not present

## 2019-12-26 DIAGNOSIS — Z951 Presence of aortocoronary bypass graft: Secondary | ICD-10-CM | POA: Diagnosis not present

## 2019-12-26 DIAGNOSIS — Z79899 Other long term (current) drug therapy: Secondary | ICD-10-CM | POA: Diagnosis not present

## 2019-12-26 DIAGNOSIS — Z7401 Bed confinement status: Secondary | ICD-10-CM | POA: Diagnosis not present

## 2019-12-26 DIAGNOSIS — I252 Old myocardial infarction: Secondary | ICD-10-CM | POA: Diagnosis not present

## 2019-12-26 DIAGNOSIS — R112 Nausea with vomiting, unspecified: Secondary | ICD-10-CM | POA: Diagnosis not present

## 2019-12-26 DIAGNOSIS — Z743 Need for continuous supervision: Secondary | ICD-10-CM | POA: Diagnosis not present

## 2019-12-26 DIAGNOSIS — Z8673 Personal history of transient ischemic attack (TIA), and cerebral infarction without residual deficits: Secondary | ICD-10-CM | POA: Diagnosis not present

## 2019-12-26 DIAGNOSIS — R531 Weakness: Secondary | ICD-10-CM | POA: Diagnosis not present

## 2019-12-26 DIAGNOSIS — J8410 Pulmonary fibrosis, unspecified: Secondary | ICD-10-CM | POA: Diagnosis not present

## 2019-12-26 DIAGNOSIS — I129 Hypertensive chronic kidney disease with stage 1 through stage 4 chronic kidney disease, or unspecified chronic kidney disease: Secondary | ICD-10-CM | POA: Diagnosis not present

## 2019-12-26 DIAGNOSIS — R69 Illness, unspecified: Secondary | ICD-10-CM | POA: Diagnosis not present

## 2019-12-26 DIAGNOSIS — E1122 Type 2 diabetes mellitus with diabetic chronic kidney disease: Secondary | ICD-10-CM | POA: Diagnosis not present

## 2019-12-26 DIAGNOSIS — K219 Gastro-esophageal reflux disease without esophagitis: Secondary | ICD-10-CM | POA: Diagnosis not present

## 2019-12-26 DIAGNOSIS — I10 Essential (primary) hypertension: Secondary | ICD-10-CM | POA: Diagnosis not present

## 2019-12-26 DIAGNOSIS — E785 Hyperlipidemia, unspecified: Secondary | ICD-10-CM | POA: Diagnosis not present

## 2019-12-26 DIAGNOSIS — R109 Unspecified abdominal pain: Secondary | ICD-10-CM | POA: Diagnosis not present

## 2019-12-26 DIAGNOSIS — R1900 Intra-abdominal and pelvic swelling, mass and lump, unspecified site: Secondary | ICD-10-CM | POA: Diagnosis not present

## 2019-12-26 DIAGNOSIS — D649 Anemia, unspecified: Secondary | ICD-10-CM | POA: Diagnosis not present

## 2019-12-26 DIAGNOSIS — R1084 Generalized abdominal pain: Secondary | ICD-10-CM | POA: Diagnosis not present

## 2019-12-26 DIAGNOSIS — I2510 Atherosclerotic heart disease of native coronary artery without angina pectoris: Secondary | ICD-10-CM | POA: Diagnosis not present

## 2019-12-26 DIAGNOSIS — N189 Chronic kidney disease, unspecified: Secondary | ICD-10-CM | POA: Diagnosis not present

## 2019-12-26 DIAGNOSIS — E039 Hypothyroidism, unspecified: Secondary | ICD-10-CM | POA: Diagnosis not present

## 2019-12-26 DIAGNOSIS — M1990 Unspecified osteoarthritis, unspecified site: Secondary | ICD-10-CM | POA: Diagnosis not present

## 2019-12-26 DIAGNOSIS — R1110 Vomiting, unspecified: Secondary | ICD-10-CM | POA: Diagnosis not present

## 2019-12-26 DIAGNOSIS — R52 Pain, unspecified: Secondary | ICD-10-CM | POA: Diagnosis not present

## 2019-12-26 DIAGNOSIS — R1030 Lower abdominal pain, unspecified: Secondary | ICD-10-CM | POA: Diagnosis not present

## 2019-12-26 DIAGNOSIS — Z880 Allergy status to penicillin: Secondary | ICD-10-CM | POA: Diagnosis not present

## 2019-12-27 DIAGNOSIS — I10 Essential (primary) hypertension: Secondary | ICD-10-CM | POA: Diagnosis not present

## 2019-12-27 DIAGNOSIS — R109 Unspecified abdominal pain: Secondary | ICD-10-CM | POA: Diagnosis not present

## 2019-12-28 DIAGNOSIS — R1900 Intra-abdominal and pelvic swelling, mass and lump, unspecified site: Secondary | ICD-10-CM

## 2019-12-28 DIAGNOSIS — I10 Essential (primary) hypertension: Secondary | ICD-10-CM | POA: Diagnosis not present

## 2019-12-28 DIAGNOSIS — Z66 Do not resuscitate: Secondary | ICD-10-CM

## 2019-12-28 DIAGNOSIS — R109 Unspecified abdominal pain: Secondary | ICD-10-CM

## 2019-12-29 DIAGNOSIS — I10 Essential (primary) hypertension: Secondary | ICD-10-CM | POA: Diagnosis not present

## 2019-12-29 DIAGNOSIS — R109 Unspecified abdominal pain: Secondary | ICD-10-CM | POA: Diagnosis not present

## 2019-12-31 ENCOUNTER — Ambulatory Visit: Payer: Medicare Other | Admitting: Cardiology

## 2020-01-01 ENCOUNTER — Ambulatory Visit: Payer: Medicare Other | Admitting: Cardiology

## 2020-03-23 ENCOUNTER — Other Ambulatory Visit: Payer: Self-pay | Admitting: Family Medicine

## 2020-03-24 ENCOUNTER — Ambulatory Visit: Payer: Medicare Other | Admitting: Family Medicine

## 2020-03-27 DEATH — deceased
